# Patient Record
Sex: Male | Born: 1935
Health system: Southern US, Community
[De-identification: ages and names within clinical notes are randomized; demographics above are authoritative.]

## PROBLEM LIST (undated history)

## (undated) DIAGNOSIS — I10 Essential (primary) hypertension: Secondary | ICD-10-CM

## (undated) DIAGNOSIS — E119 Type 2 diabetes mellitus without complications: Secondary | ICD-10-CM

## (undated) DIAGNOSIS — E785 Hyperlipidemia, unspecified: Secondary | ICD-10-CM

## (undated) HISTORY — DX: Hyperlipidemia, unspecified: E78.5

## (undated) HISTORY — DX: Type 2 diabetes mellitus without complications: E11.9

## (undated) HISTORY — PX: EYE SURGERY: SHX253

## (undated) HISTORY — DX: Essential (primary) hypertension: I10

---

## 2009-08-11 ENCOUNTER — Inpatient Hospital Stay (HOSPITAL_COMMUNITY): Admission: RE | Admit: 2009-08-11 | Discharge: 2009-08-14 | Payer: Self-pay | Admitting: Orthopedic Surgery

## 2009-09-25 ENCOUNTER — Encounter: Admission: RE | Admit: 2009-09-25 | Discharge: 2009-12-18 | Payer: Self-pay | Admitting: Orthopedic Surgery

## 2010-01-19 ENCOUNTER — Inpatient Hospital Stay (HOSPITAL_COMMUNITY): Admission: RE | Admit: 2010-01-19 | Discharge: 2010-01-22 | Payer: Self-pay | Admitting: Orthopedic Surgery

## 2010-02-17 ENCOUNTER — Encounter
Admission: RE | Admit: 2010-02-17 | Discharge: 2010-03-19 | Payer: Self-pay | Source: Home / Self Care | Attending: Orthopedic Surgery | Admitting: Orthopedic Surgery

## 2010-03-23 ENCOUNTER — Encounter
Admission: RE | Admit: 2010-03-23 | Discharge: 2010-04-21 | Payer: Self-pay | Source: Home / Self Care | Attending: Orthopedic Surgery | Admitting: Orthopedic Surgery

## 2010-04-23 ENCOUNTER — Ambulatory Visit: Payer: Medicare Other | Attending: Orthopedic Surgery | Admitting: Physical Therapy

## 2010-04-23 DIAGNOSIS — Z96659 Presence of unspecified artificial knee joint: Secondary | ICD-10-CM | POA: Insufficient documentation

## 2010-04-23 DIAGNOSIS — IMO0001 Reserved for inherently not codable concepts without codable children: Secondary | ICD-10-CM | POA: Insufficient documentation

## 2010-04-23 DIAGNOSIS — R262 Difficulty in walking, not elsewhere classified: Secondary | ICD-10-CM | POA: Insufficient documentation

## 2010-04-23 DIAGNOSIS — R5381 Other malaise: Secondary | ICD-10-CM | POA: Insufficient documentation

## 2010-04-23 DIAGNOSIS — M25669 Stiffness of unspecified knee, not elsewhere classified: Secondary | ICD-10-CM | POA: Insufficient documentation

## 2010-04-23 DIAGNOSIS — M25569 Pain in unspecified knee: Secondary | ICD-10-CM | POA: Insufficient documentation

## 2010-04-27 ENCOUNTER — Ambulatory Visit: Payer: Medicare Other | Admitting: Physical Therapy

## 2010-04-30 ENCOUNTER — Ambulatory Visit: Payer: Medicare Other | Admitting: *Deleted

## 2010-05-04 ENCOUNTER — Ambulatory Visit: Payer: Medicare Other | Admitting: Physical Therapy

## 2010-05-07 ENCOUNTER — Ambulatory Visit: Payer: Medicare Other | Admitting: Physical Therapy

## 2010-06-02 LAB — GLUCOSE, CAPILLARY
Glucose-Capillary: 107 mg/dL — ABNORMAL HIGH (ref 70–99)
Glucose-Capillary: 119 mg/dL — ABNORMAL HIGH (ref 70–99)
Glucose-Capillary: 119 mg/dL — ABNORMAL HIGH (ref 70–99)
Glucose-Capillary: 119 mg/dL — ABNORMAL HIGH (ref 70–99)
Glucose-Capillary: 124 mg/dL — ABNORMAL HIGH (ref 70–99)
Glucose-Capillary: 126 mg/dL — ABNORMAL HIGH (ref 70–99)
Glucose-Capillary: 127 mg/dL — ABNORMAL HIGH (ref 70–99)
Glucose-Capillary: 139 mg/dL — ABNORMAL HIGH (ref 70–99)
Glucose-Capillary: 148 mg/dL — ABNORMAL HIGH (ref 70–99)
Glucose-Capillary: 157 mg/dL — ABNORMAL HIGH (ref 70–99)
Glucose-Capillary: 193 mg/dL — ABNORMAL HIGH (ref 70–99)
Glucose-Capillary: 201 mg/dL — ABNORMAL HIGH (ref 70–99)

## 2010-06-02 LAB — CBC
HCT: 22.1 % — ABNORMAL LOW (ref 39.0–52.0)
HCT: 26.1 % — ABNORMAL LOW (ref 39.0–52.0)
HCT: 30.4 % — ABNORMAL LOW (ref 39.0–52.0)
Hemoglobin: 10.5 g/dL — ABNORMAL LOW (ref 13.0–17.0)
Hemoglobin: 7.6 g/dL — ABNORMAL LOW (ref 13.0–17.0)
Hemoglobin: 8.9 g/dL — ABNORMAL LOW (ref 13.0–17.0)
MCH: 30.4 pg (ref 26.0–34.0)
MCH: 30.5 pg (ref 26.0–34.0)
MCH: 30.5 pg (ref 26.0–34.0)
MCHC: 34.3 g/dL (ref 30.0–36.0)
MCHC: 34.4 g/dL (ref 30.0–36.0)
MCHC: 34.6 g/dL (ref 30.0–36.0)
MCV: 88.2 fL (ref 78.0–100.0)
MCV: 88.2 fL (ref 78.0–100.0)
MCV: 88.8 fL (ref 78.0–100.0)
Platelets: 106 10*3/uL — ABNORMAL LOW (ref 150–400)
Platelets: 110 10*3/uL — ABNORMAL LOW (ref 150–400)
Platelets: 117 10*3/uL — ABNORMAL LOW (ref 150–400)
RBC: 2.5 MIL/uL — ABNORMAL LOW (ref 4.22–5.81)
RBC: 2.94 MIL/uL — ABNORMAL LOW (ref 4.22–5.81)
RBC: 3.44 MIL/uL — ABNORMAL LOW (ref 4.22–5.81)
RDW: 14 % (ref 11.5–15.5)
RDW: 14 % (ref 11.5–15.5)
RDW: 14.3 % (ref 11.5–15.5)
WBC: 6.4 10*3/uL (ref 4.0–10.5)
WBC: 6.5 10*3/uL (ref 4.0–10.5)
WBC: 6.6 10*3/uL (ref 4.0–10.5)

## 2010-06-02 LAB — BASIC METABOLIC PANEL
BUN: 10 mg/dL (ref 6–23)
BUN: 11 mg/dL (ref 6–23)
BUN: 7 mg/dL (ref 6–23)
CO2: 26 mEq/L (ref 19–32)
CO2: 28 mEq/L (ref 19–32)
CO2: 28 mEq/L (ref 19–32)
Calcium: 8.6 mg/dL (ref 8.4–10.5)
Calcium: 8.6 mg/dL (ref 8.4–10.5)
Calcium: 8.9 mg/dL (ref 8.4–10.5)
Chloride: 101 mEq/L (ref 96–112)
Chloride: 102 mEq/L (ref 96–112)
Chloride: 103 mEq/L (ref 96–112)
Creatinine, Ser: 0.65 mg/dL (ref 0.4–1.5)
Creatinine, Ser: 0.7 mg/dL (ref 0.4–1.5)
Creatinine, Ser: 0.75 mg/dL (ref 0.4–1.5)
GFR calc Af Amer: >60 mL/min (ref >60)
GFR calc Af Amer: >60 mL/min (ref >60)
GFR calc Af Amer: >60 mL/min (ref >60)
GFR calc non Af Amer: >60 mL/min (ref >60)
GFR calc non Af Amer: >60 mL/min (ref >60)
GFR calc non Af Amer: >60 mL/min (ref >60)
Glucose, Bld: 117 mg/dL — ABNORMAL HIGH (ref 70–99)
Glucose, Bld: 128 mg/dL — ABNORMAL HIGH (ref 70–99)
Glucose, Bld: 132 mg/dL — ABNORMAL HIGH (ref 70–99)
Potassium: 3.8 mEq/L (ref 3.5–5.1)
Potassium: 4 mEq/L (ref 3.5–5.1)
Potassium: 4.1 mEq/L (ref 3.5–5.1)
Sodium: 134 mEq/L — ABNORMAL LOW (ref 135–145)
Sodium: 135 mEq/L (ref 135–145)
Sodium: 136 mEq/L (ref 135–145)

## 2010-06-02 LAB — PROTIME-INR
INR: 1.18 (ref 0.00–1.49)
INR: 1.63 — ABNORMAL HIGH (ref 0.00–1.49)
INR: 1.96 — ABNORMAL HIGH (ref 0.00–1.49)
Prothrombin Time: 15.2 seconds (ref 11.6–15.2)
Prothrombin Time: 19.5 seconds — ABNORMAL HIGH (ref 11.6–15.2)
Prothrombin Time: 22.5 seconds — ABNORMAL HIGH (ref 11.6–15.2)

## 2010-06-02 LAB — PREPARE RBC (CROSSMATCH)

## 2010-06-03 LAB — URINALYSIS, ROUTINE W REFLEX MICROSCOPIC
Bilirubin Urine: NEGATIVE
Glucose, UA: NEGATIVE mg/dL
Hgb urine dipstick: NEGATIVE
Ketones, ur: NEGATIVE mg/dL
Nitrite: NEGATIVE
Protein, ur: NEGATIVE mg/dL
Specific Gravity, Urine: 1.025 (ref 1.005–1.030)
Urobilinogen, UA: 1 mg/dL (ref 0.0–1.0)
pH: 6 (ref 5.0–8.0)

## 2010-06-03 LAB — CBC
HCT: 38 % — ABNORMAL LOW (ref 39.0–52.0)
Hemoglobin: 13.1 g/dL (ref 13.0–17.0)
MCH: 30.3 pg (ref 26.0–34.0)
MCHC: 34.3 g/dL (ref 30.0–36.0)
MCV: 88.3 fL (ref 78.0–100.0)
Platelets: 160 10*3/uL (ref 150–400)
RBC: 4.3 MIL/uL (ref 4.22–5.81)
RDW: 14 % (ref 11.5–15.5)
WBC: 4.7 10*3/uL (ref 4.0–10.5)

## 2010-06-03 LAB — COMPREHENSIVE METABOLIC PANEL
ALT: 18 U/L (ref 0–53)
AST: 22 U/L (ref 0–37)
Albumin: 4.3 g/dL (ref 3.5–5.2)
Alkaline Phosphatase: 69 U/L (ref 39–117)
BUN: 18 mg/dL (ref 6–23)
CO2: 28 mEq/L (ref 19–32)
Calcium: 9.4 mg/dL (ref 8.4–10.5)
Chloride: 105 mEq/L (ref 96–112)
Creatinine, Ser: 0.77 mg/dL (ref 0.4–1.5)
GFR calc Af Amer: >60 mL/min (ref >60)
GFR calc non Af Amer: >60 mL/min (ref >60)
Glucose, Bld: 123 mg/dL — ABNORMAL HIGH (ref 70–99)
Potassium: 3.9 mEq/L (ref 3.5–5.1)
Sodium: 139 mEq/L (ref 135–145)
Total Bilirubin: 0.5 mg/dL (ref 0.3–1.2)
Total Protein: 7.5 g/dL (ref 6.0–8.3)

## 2010-06-03 LAB — TYPE AND SCREEN
ABO/RH(D): B POS
Antibody Screen: NEGATIVE
Unit division: 0
Unit division: 0

## 2010-06-03 LAB — GLUCOSE, CAPILLARY
Glucose-Capillary: 102 mg/dL — ABNORMAL HIGH (ref 70–99)
Glucose-Capillary: 115 mg/dL — ABNORMAL HIGH (ref 70–99)
Glucose-Capillary: 127 mg/dL — ABNORMAL HIGH (ref 70–99)
Glucose-Capillary: 136 mg/dL — ABNORMAL HIGH (ref 70–99)

## 2010-06-03 LAB — SURGICAL PCR SCREEN
MRSA, PCR: NEGATIVE
Staphylococcus aureus: POSITIVE — AB

## 2010-06-03 LAB — APTT: aPTT: 31 seconds (ref 24–37)

## 2010-06-03 LAB — PROTIME-INR
INR: 1 (ref 0.00–1.49)
Prothrombin Time: 13.4 seconds (ref 11.6–15.2)

## 2010-06-08 LAB — TYPE AND SCREEN
ABO/RH(D): B POS
Antibody Screen: NEGATIVE

## 2010-06-08 LAB — CBC
HCT: 25.4 % — ABNORMAL LOW (ref 39.0–52.0)
HCT: 29.4 % — ABNORMAL LOW (ref 39.0–52.0)
HCT: 29.8 % — ABNORMAL LOW (ref 39.0–52.0)
HCT: 36.6 % — ABNORMAL LOW (ref 39.0–52.0)
Hemoglobin: 10 g/dL — ABNORMAL LOW (ref 13.0–17.0)
Hemoglobin: 10 g/dL — ABNORMAL LOW (ref 13.0–17.0)
Hemoglobin: 12.4 g/dL — ABNORMAL LOW (ref 13.0–17.0)
Hemoglobin: 8.5 g/dL — ABNORMAL LOW (ref 13.0–17.0)
MCHC: 33.6 g/dL (ref 30.0–36.0)
MCHC: 33.6 g/dL (ref 30.0–36.0)
MCHC: 33.8 g/dL (ref 30.0–36.0)
MCHC: 33.9 g/dL (ref 30.0–36.0)
MCV: 90.1 fL (ref 78.0–100.0)
MCV: 90.2 fL (ref 78.0–100.0)
MCV: 90.6 fL (ref 78.0–100.0)
MCV: 91.4 fL (ref 78.0–100.0)
Platelets: 106 10*3/uL — ABNORMAL LOW (ref 150–400)
Platelets: 114 10*3/uL — ABNORMAL LOW (ref 150–400)
Platelets: 163 10*3/uL (ref 150–400)
Platelets: 90 10*3/uL — ABNORMAL LOW (ref 150–400)
RBC: 2.77 MIL/uL — ABNORMAL LOW (ref 4.22–5.81)
RBC: 3.26 MIL/uL — ABNORMAL LOW (ref 4.22–5.81)
RBC: 3.31 MIL/uL — ABNORMAL LOW (ref 4.22–5.81)
RBC: 4.04 MIL/uL — ABNORMAL LOW (ref 4.22–5.81)
RDW: 12.7 % (ref 11.5–15.5)
RDW: 13.4 % (ref 11.5–15.5)
RDW: 13.4 % (ref 11.5–15.5)
RDW: 13.6 % (ref 11.5–15.5)
WBC: 5 10*3/uL (ref 4.0–10.5)
WBC: 7 10*3/uL (ref 4.0–10.5)
WBC: 7.3 10*3/uL (ref 4.0–10.5)
WBC: 7.6 10*3/uL (ref 4.0–10.5)

## 2010-06-08 LAB — URINE MICROSCOPIC-ADD ON

## 2010-06-08 LAB — GLUCOSE, CAPILLARY
Glucose-Capillary: 118 mg/dL — ABNORMAL HIGH (ref 70–99)
Glucose-Capillary: 127 mg/dL — ABNORMAL HIGH (ref 70–99)
Glucose-Capillary: 127 mg/dL — ABNORMAL HIGH (ref 70–99)
Glucose-Capillary: 129 mg/dL — ABNORMAL HIGH (ref 70–99)
Glucose-Capillary: 131 mg/dL — ABNORMAL HIGH (ref 70–99)
Glucose-Capillary: 132 mg/dL — ABNORMAL HIGH (ref 70–99)
Glucose-Capillary: 134 mg/dL — ABNORMAL HIGH (ref 70–99)
Glucose-Capillary: 139 mg/dL — ABNORMAL HIGH (ref 70–99)
Glucose-Capillary: 153 mg/dL — ABNORMAL HIGH (ref 70–99)
Glucose-Capillary: 154 mg/dL — ABNORMAL HIGH (ref 70–99)
Glucose-Capillary: 158 mg/dL — ABNORMAL HIGH (ref 70–99)
Glucose-Capillary: 166 mg/dL — ABNORMAL HIGH (ref 70–99)
Glucose-Capillary: 187 mg/dL — ABNORMAL HIGH (ref 70–99)
Glucose-Capillary: 235 mg/dL — ABNORMAL HIGH (ref 70–99)

## 2010-06-08 LAB — COMPREHENSIVE METABOLIC PANEL
ALT: 17 U/L (ref 0–53)
AST: 23 U/L (ref 0–37)
Albumin: 4.4 g/dL (ref 3.5–5.2)
Alkaline Phosphatase: 76 U/L (ref 39–117)
BUN: 16 mg/dL (ref 6–23)
CO2: 26 mEq/L (ref 19–32)
Calcium: 9.4 mg/dL (ref 8.4–10.5)
Chloride: 106 mEq/L (ref 96–112)
Creatinine, Ser: 0.7 mg/dL (ref 0.4–1.5)
GFR calc Af Amer: >60 mL/min (ref >60)
GFR calc non Af Amer: >60 mL/min (ref >60)
Glucose, Bld: 76 mg/dL (ref 70–99)
Potassium: 4 mEq/L (ref 3.5–5.1)
Sodium: 139 mEq/L (ref 135–145)
Total Bilirubin: 0.7 mg/dL (ref 0.3–1.2)
Total Protein: 7.8 g/dL (ref 6.0–8.3)

## 2010-06-08 LAB — URINALYSIS, ROUTINE W REFLEX MICROSCOPIC
Bilirubin Urine: NEGATIVE
Glucose, UA: NEGATIVE mg/dL
Hgb urine dipstick: NEGATIVE
Ketones, ur: NEGATIVE mg/dL
Leukocytes, UA: NEGATIVE
Nitrite: NEGATIVE
Protein, ur: 30 mg/dL — AB
Specific Gravity, Urine: 1.024 (ref 1.005–1.030)
Urobilinogen, UA: 0.2 mg/dL (ref 0.0–1.0)
pH: 6 (ref 5.0–8.0)

## 2010-06-08 LAB — PROTIME-INR
INR: 1.06 (ref 0.00–1.49)
INR: 1.18 (ref 0.00–1.49)
INR: 1.93 — ABNORMAL HIGH (ref 0.00–1.49)
INR: 2.14 — ABNORMAL HIGH (ref 0.00–1.49)
Prothrombin Time: 13.7 seconds (ref 11.6–15.2)
Prothrombin Time: 14.9 seconds (ref 11.6–15.2)
Prothrombin Time: 21.9 seconds — ABNORMAL HIGH (ref 11.6–15.2)
Prothrombin Time: 23.7 seconds — ABNORMAL HIGH (ref 11.6–15.2)

## 2010-06-08 LAB — BASIC METABOLIC PANEL
BUN: 13 mg/dL (ref 6–23)
BUN: 9 mg/dL (ref 6–23)
CO2: 27 mEq/L (ref 19–32)
CO2: 27 mEq/L (ref 19–32)
Calcium: 8.7 mg/dL (ref 8.4–10.5)
Calcium: 8.8 mg/dL (ref 8.4–10.5)
Chloride: 102 mEq/L (ref 96–112)
Chloride: 105 mEq/L (ref 96–112)
Creatinine, Ser: 0.7 mg/dL (ref 0.4–1.5)
Creatinine, Ser: 0.74 mg/dL (ref 0.4–1.5)
GFR calc Af Amer: >60 mL/min (ref >60)
GFR calc Af Amer: >60 mL/min (ref >60)
GFR calc non Af Amer: >60 mL/min (ref >60)
GFR calc non Af Amer: >60 mL/min (ref >60)
Glucose, Bld: 157 mg/dL — ABNORMAL HIGH (ref 70–99)
Glucose, Bld: 202 mg/dL — ABNORMAL HIGH (ref 70–99)
Potassium: 3.8 mEq/L (ref 3.5–5.1)
Potassium: 4.7 mEq/L (ref 3.5–5.1)
Sodium: 136 mEq/L (ref 135–145)
Sodium: 139 mEq/L (ref 135–145)

## 2010-06-08 LAB — ABO/RH: ABO/RH(D): B POS

## 2010-06-08 LAB — APTT: aPTT: 29 seconds (ref 24–37)

## 2011-06-07 DIAGNOSIS — I10 Essential (primary) hypertension: Secondary | ICD-10-CM | POA: Diagnosis not present

## 2011-06-07 DIAGNOSIS — E785 Hyperlipidemia, unspecified: Secondary | ICD-10-CM | POA: Diagnosis not present

## 2011-06-07 DIAGNOSIS — E559 Vitamin D deficiency, unspecified: Secondary | ICD-10-CM | POA: Diagnosis not present

## 2011-06-07 DIAGNOSIS — E119 Type 2 diabetes mellitus without complications: Secondary | ICD-10-CM | POA: Diagnosis not present

## 2011-06-07 DIAGNOSIS — Z125 Encounter for screening for malignant neoplasm of prostate: Secondary | ICD-10-CM | POA: Diagnosis not present

## 2011-07-02 DIAGNOSIS — Z961 Presence of intraocular lens: Secondary | ICD-10-CM | POA: Diagnosis not present

## 2011-07-02 DIAGNOSIS — H52229 Regular astigmatism, unspecified eye: Secondary | ICD-10-CM | POA: Diagnosis not present

## 2011-07-02 DIAGNOSIS — H5200 Hypermetropia, unspecified eye: Secondary | ICD-10-CM | POA: Diagnosis not present

## 2011-07-02 DIAGNOSIS — E119 Type 2 diabetes mellitus without complications: Secondary | ICD-10-CM | POA: Diagnosis not present

## 2012-03-23 DIAGNOSIS — L2089 Other atopic dermatitis: Secondary | ICD-10-CM | POA: Diagnosis not present

## 2012-03-23 DIAGNOSIS — Z23 Encounter for immunization: Secondary | ICD-10-CM | POA: Diagnosis not present

## 2012-08-01 ENCOUNTER — Other Ambulatory Visit: Payer: Self-pay | Admitting: *Deleted

## 2012-08-01 MED ORDER — SITAGLIPTIN PHOS-METFORMIN HCL 50-1000 MG PO TABS: 1.0000 | ORAL_TABLET | Freq: Two times a day (BID) | ORAL | Status: DC

## 2012-08-01 NOTE — Telephone Encounter (Signed)
Patient last seen on 03-23-12. Last labs done 06-07-11. Please advise.

## 2012-08-31 ENCOUNTER — Other Ambulatory Visit: Payer: Self-pay | Admitting: *Deleted

## 2012-08-31 MED ORDER — LISINOPRIL 5 MG PO TABS: 10.0000 mg | ORAL_TABLET | Freq: Every day | ORAL | Status: DC

## 2012-10-11 ENCOUNTER — Other Ambulatory Visit: Payer: Self-pay

## 2012-10-11 MED ORDER — SITAGLIPTIN PHOS-METFORMIN HCL 50-1000 MG PO TABS: 1.0000 | ORAL_TABLET | Freq: Two times a day (BID) | ORAL | Status: DC

## 2012-10-11 NOTE — Telephone Encounter (Signed)
Last seen MMM 03/23/12  Last filled 08/01/12  Last glucose level 06/07/11

## 2012-11-06 ENCOUNTER — Other Ambulatory Visit: Payer: Self-pay

## 2012-11-06 MED ORDER — LISINOPRIL 5 MG PO TABS: 10.0000 mg | ORAL_TABLET | Freq: Every day | ORAL | Status: DC

## 2012-11-06 MED ORDER — GLIPIZIDE 10 MG PO TABS: 10.0000 mg | ORAL_TABLET | Freq: Two times a day (BID) | ORAL | Status: DC

## 2012-11-06 NOTE — Telephone Encounter (Signed)
Last seen 03/23/12  MMM  Glipizide being sent over from the drug store but not on med list in chart

## 2012-11-07 ENCOUNTER — Telehealth: Payer: Self-pay | Admitting: Nurse Practitioner

## 2012-11-15 ENCOUNTER — Other Ambulatory Visit: Payer: Self-pay

## 2012-11-15 MED ORDER — LISINOPRIL 5 MG PO TABS: 10.0000 mg | ORAL_TABLET | Freq: Every day | ORAL | Status: DC

## 2012-11-15 MED ORDER — GLIPIZIDE 10 MG PO TABS: 10.0000 mg | ORAL_TABLET | Freq: Two times a day (BID) | ORAL | Status: DC

## 2012-11-15 MED ORDER — SITAGLIPTIN PHOS-METFORMIN HCL 50-1000 MG PO TABS: 1.0000 | ORAL_TABLET | Freq: Two times a day (BID) | ORAL | Status: DC

## 2012-11-15 NOTE — Telephone Encounter (Signed)
Patient has an appt with you on 12/15/12  Is out of Blood sugar meds

## 2012-12-15 ENCOUNTER — Encounter: Payer: Self-pay | Admitting: Nurse Practitioner

## 2012-12-15 ENCOUNTER — Ambulatory Visit (INDEPENDENT_AMBULATORY_CARE_PROVIDER_SITE_OTHER): Payer: Medicare Other | Admitting: Nurse Practitioner

## 2012-12-15 VITALS — BP 124/67 | HR 62 | Temp 97.2°F | Ht 60.0 in | Wt 158.0 lb

## 2012-12-15 DIAGNOSIS — I10 Essential (primary) hypertension: Secondary | ICD-10-CM

## 2012-12-15 DIAGNOSIS — Z125 Encounter for screening for malignant neoplasm of prostate: Secondary | ICD-10-CM

## 2012-12-15 DIAGNOSIS — Z23 Encounter for immunization: Secondary | ICD-10-CM | POA: Diagnosis not present

## 2012-12-15 DIAGNOSIS — E119 Type 2 diabetes mellitus without complications: Secondary | ICD-10-CM

## 2012-12-15 DIAGNOSIS — E1159 Type 2 diabetes mellitus with other circulatory complications: Secondary | ICD-10-CM | POA: Insufficient documentation

## 2012-12-15 LAB — POCT GLYCOSYLATED HEMOGLOBIN (HGB A1C): Hemoglobin A1C: 7.1

## 2012-12-15 MED ORDER — SITAGLIPTIN PHOS-METFORMIN HCL 50-1000 MG PO TABS: 1.0000 | ORAL_TABLET | Freq: Two times a day (BID) | ORAL | Status: DC

## 2012-12-15 MED ORDER — GLIPIZIDE 10 MG PO TABS: 10.0000 mg | ORAL_TABLET | Freq: Two times a day (BID) | ORAL | Status: DC

## 2012-12-15 MED ORDER — LISINOPRIL 5 MG PO TABS: 10.0000 mg | ORAL_TABLET | Freq: Every day | ORAL | Status: DC

## 2012-12-15 NOTE — Progress Notes (Signed)
Subjective:    Patient ID: Roberto Miller, male    DOB: December 06, 1931, 79 y.o.   MRN: [id number]  Hypertension This is a chronic problem. The current episode started more than 1 year ago. The problem is unchanged. The problem is controlled. Pertinent negatives include no blurred vision, chest pain, headaches, neck pain, orthopnea, palpitations, peripheral edema, shortness of breath or sweats. There are no associated agents to hypertension. Risk factors for coronary artery disease include family history, obesity and male gender. Past treatments include calcium channel blockers and ACE inhibitors. The current treatment provides significant improvement. There are no compliance problems.   Diabetes He presents for his follow-up diabetic visit. He has type 2 diabetes mellitus. No MedicAlert identification noted. His disease course has been stable. Pertinent negatives for hypoglycemia include no headaches or sweats. Pertinent negatives for diabetes include no blurred vision and no chest pain. There are no hypoglycemic complications. Symptoms are stable. There are no diabetic complications. He is compliant with treatment most of the time. His weight is stable. When asked about meal planning, he reported none. He has not had a previous visit with a dietician. He rarely participates in exercise. There is no change in his home blood glucose trend. His breakfast blood glucose is taken between 8-9 am. His breakfast blood glucose range is generally 110-130 mg/dl. An ACE inhibitor/angiotensin II receptor blocker is being taken. He does not see a podiatrist.Eye exam is not current.      Review of Systems  HENT: Negative for neck pain.   Eyes: Negative for blurred vision.  Respiratory: Negative for shortness of breath.   Cardiovascular: Negative for chest pain, palpitations and orthopnea.  Neurological: Negative for headaches.  All other systems reviewed and are negative.       Objective:   Physical Exam   Constitutional: He is oriented to person, place, and time. He appears well-developed and well-nourished.  HENT:  Head: Normocephalic.  Right Ear: External ear normal.  Left Ear: External ear normal.  Nose: Nose normal.  Mouth/Throat: Oropharynx is clear and moist.  Eyes: EOM are normal. Pupils are equal, round, and reactive to light.  Neck: Normal range of motion. Neck supple. No thyromegaly present.  Cardiovascular: Normal rate, regular rhythm, normal heart sounds and intact distal pulses.   No murmur heard. Pulmonary/Chest: Effort normal and breath sounds normal. He has no wheezes. He has no rales.  Abdominal: Soft. Bowel sounds are normal.  Genitourinary:  Refuses rectal exam   Musculoskeletal: Normal range of motion.  Neurological: He is alert and oriented to person, place, and time.  Skin: Skin is warm and dry.  Psychiatric: He has a normal mood and affect. His behavior is normal. Judgment and thought content normal.     BP 124/67  Pulse 62  Temp(Src) 97.2 F (36.2 C) (Oral)  Ht 5' (1.524 m)  Wt 158 lb (71.668 kg)  BMI 30.86 kg/m2      Assessment & Plan:   1. Hypertension   2. Type II or unspecified type diabetes mellitus without mention of complication, not stated as uncontrolled   3. Prostate cancer screening    Orders Placed This Encounter  Procedures  . CMP14+EGFR  . NMR, lipoprofile  . POCT glycosylated hemoglobin (Hb A1C)   Meds ordered this encounter  Medications  . lisinopril (PRINIVIL,ZESTRIL) 5 MG tablet    Sig: Take 2 tablets (10 mg total) by mouth daily.    Dispense:  60 tablet    Refill:  5  Order Specific Question:  Supervising Provider    Answer:  Ernestina Penna [[id number]]  . glipiZIDE (GLUCOTROL) 10 MG tablet    Sig: Take 1 tablet (10 mg total) by mouth 2 (two) times daily before a meal.    Dispense:  30 tablet    Refill:  5    Order Specific Question:  Supervising Provider    Answer:  Ernestina Penna [[id number]]  . sitaGLIPtin-metformin  (JANUMET) 50-1000 MG per tablet    Sig: Take 1 tablet by mouth 2 (two) times daily with a meal.    Dispense:  60 tablet    Refill:  5    Order Specific Question:  Supervising Provider    Answer:  Deborra Medina    Continue all meds Labs pending Diet and exercise encouraged Health maintenance reviewed Follow up in 3 months   Mary-Margaret Daphine Deutscher, FNP

## 2012-12-15 NOTE — Patient Instructions (Addendum)
Health Maintenance, Males A healthy lifestyle and preventative care can promote health and wellness.  Maintain regular health, dental, and eye exams.  Eat a healthy diet. Foods like vegetables, fruits, whole grains, low-fat dairy products, and lean protein foods contain the nutrients you need without too many calories. Decrease your intake of foods high in solid fats, added sugars, and salt. Get information about a proper diet from your caregiver, if necessary.  Regular physical exercise is one of the most important things you can do for your health. Most adults should get at least 150 minutes of moderate-intensity exercise (any activity that increases your heart rate and causes you to sweat) each week. In addition, most adults need muscle-strengthening exercises on 2 or more days a week.   Maintain a healthy weight. The body mass index (BMI) is a screening tool to identify possible weight problems. It provides an estimate of body fat based on height and weight. Your caregiver can help determine your BMI, and can help you achieve or maintain a healthy weight. For adults 20 years and older:  A BMI below 18.5 is considered underweight.  A BMI of 18.5 to 24.9 is normal.  A BMI of 25 to 29.9 is considered overweight.  A BMI of 30 and above is considered obese.  Maintain normal blood lipids and cholesterol by exercising and minimizing your intake of saturated fat. Eat a balanced diet with plenty of fruits and vegetables. Blood tests for lipids and cholesterol should begin at age 20 and be repeated every 5 years. If your lipid or cholesterol levels are high, you are over 50, or you are a high risk for heart disease, you may need your cholesterol levels checked more frequently.Ongoing high lipid and cholesterol levels should be treated with medicines, if diet and exercise are not effective.  If you smoke, find out from your caregiver how to quit. If you do not use tobacco, do not start.  If you  choose to drink alcohol, do not exceed 2 drinks per day. One drink is considered to be 12 ounces (355 mL) of beer, 5 ounces (148 mL) of wine, or 1.5 ounces (44 mL) of liquor.  Avoid use of street drugs. Do not share needles with anyone. Ask for help if you need support or instructions about stopping the use of drugs.  High blood pressure causes heart disease and increases the risk of stroke. Blood pressure should be checked at least every 1 to 2 years. Ongoing high blood pressure should be treated with medicines if weight loss and exercise are not effective.  If you are 45 to 77 years old, ask your caregiver if you should take aspirin to prevent heart disease.  Diabetes screening involves taking a blood sample to check your fasting blood sugar level. This should be done once every 3 years, after age 45, if you are within normal weight and without risk factors for diabetes. Testing should be considered at a younger age or be carried out more frequently if you are overweight and have at least 1 risk factor for diabetes.  Colorectal cancer can be detected and often prevented. Most routine colorectal cancer screening begins at the age of 50 and continues through age 75. However, your caregiver may recommend screening at an earlier age if you have risk factors for colon cancer. On a yearly basis, your caregiver may provide home test kits to check for hidden blood in the stool. Use of a small camera at the end of a tube,   to directly examine the colon (sigmoidoscopy or colonoscopy), can detect the earliest forms of colorectal cancer. Talk to your caregiver about this at age 83, when routine screening begins. Direct examination of the colon should be repeated every 5 to 10 years through age 48, unless early forms of pre-cancerous polyps or small growths are found.  Hepatitis C blood testing is recommended for all people born from [birth year] through 1963 and any individual with known risks for hepatitis C.  Healthy  men should no longer receive prostate-specific antigen (PSA) blood tests as part of routine cancer screening. Consult with your caregiver about prostate cancer screening.  Testicular cancer screening is not recommended for adolescents or adult males who have no symptoms. Screening includes self-exam, caregiver exam, and other screening tests. Consult with your caregiver about any symptoms you have or any concerns you have about testicular cancer.  Practice safe sex. Use condoms and avoid high-risk sexual practices to reduce the spread of sexually transmitted infections (STIs).  Use sunscreen with a sun protection factor (SPF) of 30 or greater. Apply sunscreen liberally and repeatedly throughout the day. You should seek shade when your shadow is shorter than you. Protect yourself by wearing long sleeves, pants, a wide-brimmed hat, and sunglasses year round, whenever you are outdoors.  Notify your caregiver of new moles or changes in moles, especially if there is a change in shape or color. Also notify your caregiver if a mole is larger than the size of a pencil eraser.  A one-time screening for abdominal aortic aneurysm (AAA) and surgical repair of large AAAs by sound wave imaging (ultrasonography) is recommended for ages 66 to 23 years who are current or former smokers.  Stay current with your immunizations. Document Released: 09/04/2007 Document Revised: 05/31/2011 Document Reviewed: 08/03/2010 [facility] Patient Information 2014 [place], [place].   Influenza Virus Trivalent Vaccine injection Qu es este medicamento? La VACUNA ANTIGRIPAL ayuda a disminuir el riesgo de contraer la influenza, tambin conocida como la gripe. La vacuna solo ayuda a protegerle contra algunas cepas de influenza. Este medicamento puede ser utilizado para otros usos; si tiene alguna pregunta consulte con su proveedor de atencin mdica o con su farmacutico. Qu le debo informar a mi profesional de la salud antes de  tomar este medicamento? Necesita saber si usted presenta alguno de los siguientes problemas o situaciones: -trastorno de sangrado como hemofilia -fiebre o infeccin -sndrome de Guillain-Barre u otros problemas neurolgicos -problemas del sistema inmunolgico -infeccin por el virus de la inmunodeficiencia humana (VIH) o SIDA -niveles bajos de plaquetas en la sangre -esclerosis mltiple -una Automotive engineer o inusual a las vacunas antigripales, a los [place], protenas de pollo, al timerosal, a otros medicamentos, alimentos, colorantes o conservantes -si est embarazada o buscando quedar embarazada -si est amamantando a un beb Cmo debo utilizar este medicamento? Esta vacuna se administra mediante inyeccin por va intramuscular o va subcutnea. Lo administra un profesional de [employer]. Recibir una copia de informacin escrita sobre la vacuna antes de cada vacuna. Asegrese de leer este folleto cada vez cuidadosamente. Este folleto puede cambiar con frecuencia. Hable con su pediatra para informarse acerca del uso de este medicamento en nios. Puede requerir atencin especial. Aunque algunas marcas de este medicamento se puede recetar a nios tan menores como de 6 meses de edad para condiciones selectivas, las precauciones se aplican. Sobredosis: Pngase en contacto inmediatamente con un centro toxicolgico o una sala de urgencia si usted cree que haya tomado demasiado medicamento. ATENCIN: Ara Kussmaul es solo para  usted. No comparta este medicamento con nadie. Qu sucede si me olvido de una dosis? No se aplica en este caso. Qu puede interactuar con este medicamento? -quimioterapia o radioterapia -medicamentos que suprimen el sistema inmunolgico, tales como etanercept, anakinra, infliximab y adalimumab -medicamentos que tratan o previenen cogulos sanguneos, como warfarina -fenitona -medicamentos esteroideos, como la prednisona o la cortisona -teofilina -vacunas Puede ser  que esta lista no menciona todas las posibles interacciones. Informe a su profesional de [employer] de [employer] productos a base de hierbas, medicamentos de [place] o suplementos nutritivos que est tomando. Si usted fuma, consume bebidas alcohlicas o si utiliza drogas ilegales, indqueselo tambin a su profesional de [employer]. Algunas sustancias pueden interactuar con su medicamento. A qu debo estar atento al usar [employer]? Informe a su mdico o a Producer, television/film/video de la [employer] todos los efectos secundarios que persistan despus de [address]. Llame a su proveedor de atencin mdica si se presentan sntomas inusuales dentro de las 6 semanas posteriores a la vacunacin. Es posible que todava pueda contraer la gripe, pero la enfermedad no ser tan fuerte como normalmente. No puede contraer la gripe de esta vacuna. La vacuna antigripal no le protege contra resfros u otras enfermedades que pueden causar [place]. Debe vacunarse cada ao. Qu efectos secundarios puedo tener al [employer] este medicamento? Efectos secundarios que debe informar a su mdico o a Producer, television/film/video de la salud tan pronto como sea posible: -reacciones alrgicas como erupcin cutnea, picazn o urticarias, hinchazn de la cara, labios o lengua Efectos secundarios que, por lo general, no requieren atencin mdica (debe informarlos a su mdico o a su profesional de la salud si persisten o si son molestos): -fiebre -dolor de cabeza -molestias y dolores musculares -dolor, sensibilidad, enrojecimiento o Paramedic de la inyeccin -cansancio Puede ser que esta lista no menciona todos los posibles efectos secundarios. Comunquese a su mdico por asesoramiento mdico [employer]. Usted puede informar los efectos secundarios a la FDA por telfono al 1-800-FDA-1088. Dnde debo guardar mi medicina? Esta Automotive engineer un profesional de la salud en una [place], [place], consultorio mdico u  otro consultorio de un profesional de la salud. No se le suministrar esta vacuna para guardar en su domicilio. ATENCIN: Este folleto es un resumen. Puede ser que no cubra toda la posible informacin. Si usted tiene preguntas acerca de esta medicina, consulte con su mdico, su farmacutico o su profesional de Radiographer, therapeutic.  2013, Elsevier/Gold Standard. (09/09/2009 3:29:49 PM)

## 2012-12-16 LAB — CMP14+EGFR
ALT: 16 IU/L (ref 0–44)
AST: 20 IU/L (ref 0–40)
Albumin/Globulin Ratio: 1.7 (ref 1.1–2.5)
Albumin: 4.3 g/dL (ref 3.5–4.8)
Alkaline Phosphatase: 80 IU/L (ref 39–117)
BUN/Creatinine Ratio: 24 — ABNORMAL HIGH (ref 10–22)
BUN: 18 mg/dL (ref 8–27)
CO2: 26 mmol/L (ref 18–29)
Calcium: 8.9 mg/dL (ref 8.6–10.2)
Chloride: 101 mmol/L (ref 97–108)
Creatinine, Ser: 0.75 mg/dL — ABNORMAL LOW (ref 0.76–1.27)
GFR calc Af Amer: 101 mL/min/{1.73_m2} (ref >59)
GFR calc non Af Amer: 87 mL/min/{1.73_m2} (ref >59)
Globulin, Total: 2.5 g/dL (ref 1.5–4.5)
Glucose: 236 mg/dL — ABNORMAL HIGH (ref 65–99)
Potassium: 4 mmol/L (ref 3.5–5.2)
Sodium: 139 mmol/L (ref 134–144)
Total Bilirubin: 0.3 mg/dL (ref 0.0–1.2)
Total Protein: 6.8 g/dL (ref 6.0–8.5)

## 2012-12-16 LAB — NMR, LIPOPROFILE
Cholesterol: 155 mg/dL (ref <200)
HDL Cholesterol by NMR: 44 mg/dL (ref >=40)
HDL Particle Number: 30.9 umol/L (ref >=30.5)
LDL Particle Number: 1373 nmol/L — ABNORMAL HIGH (ref <1000)
LDL Size: 19.9 nm — ABNORMAL LOW (ref >20.5)
LDLC SERPL CALC-MCNC: 68 mg/dL (ref <100)
LP-IR Score: 77 — ABNORMAL HIGH (ref <=45)
Small LDL Particle Number: 995 nmol/L — ABNORMAL HIGH (ref <=527)
Triglycerides by NMR: 217 mg/dL — ABNORMAL HIGH (ref <150)

## 2013-04-18 DIAGNOSIS — H52229 Regular astigmatism, unspecified eye: Secondary | ICD-10-CM | POA: Diagnosis not present

## 2013-04-18 DIAGNOSIS — Z961 Presence of intraocular lens: Secondary | ICD-10-CM | POA: Diagnosis not present

## 2013-04-18 DIAGNOSIS — H524 Presbyopia: Secondary | ICD-10-CM | POA: Diagnosis not present

## 2013-04-18 DIAGNOSIS — H02839 Dermatochalasis of unspecified eye, unspecified eyelid: Secondary | ICD-10-CM | POA: Diagnosis not present

## 2013-06-20 ENCOUNTER — Other Ambulatory Visit: Payer: Self-pay | Admitting: Nurse Practitioner

## 2013-07-19 ENCOUNTER — Other Ambulatory Visit: Payer: Self-pay | Admitting: Family Medicine

## 2013-07-20 NOTE — Telephone Encounter (Signed)
Last A1C 12/15/12

## 2013-07-21 NOTE — Telephone Encounter (Signed)
Patient NTBS for follow up and lab work  

## 2013-07-23 ENCOUNTER — Other Ambulatory Visit: Payer: Self-pay | Admitting: Nurse Practitioner

## 2013-07-24 NOTE — Telephone Encounter (Signed)
Notified at last refill that NTBS. Please advise 

## 2013-07-25 NOTE — Telephone Encounter (Signed)
Patient NTBS for follow up and lab work  

## 2013-08-23 ENCOUNTER — Other Ambulatory Visit: Payer: Self-pay | Admitting: Nurse Practitioner

## 2013-08-24 NOTE — Telephone Encounter (Signed)
Patient last seen in office on 12-15-12. Please advise on refill

## 2013-08-24 NOTE — Telephone Encounter (Signed)
Patient NTBS for follow up and lab work  

## 2013-08-29 DIAGNOSIS — H26499 Other secondary cataract, unspecified eye: Secondary | ICD-10-CM | POA: Diagnosis not present

## 2013-08-29 DIAGNOSIS — H04129 Dry eye syndrome of unspecified lacrimal gland: Secondary | ICD-10-CM | POA: Diagnosis not present

## 2013-08-29 DIAGNOSIS — Z961 Presence of intraocular lens: Secondary | ICD-10-CM | POA: Diagnosis not present

## 2013-10-01 ENCOUNTER — Other Ambulatory Visit: Payer: Self-pay | Admitting: Nurse Practitioner

## 2013-10-01 NOTE — Telephone Encounter (Signed)
Patient NTBS for follow up and lab work  

## 2013-10-01 NOTE — Telephone Encounter (Signed)
Last seen in office on 11-20-12. Please advise

## 2013-10-08 DIAGNOSIS — H26499 Other secondary cataract, unspecified eye: Secondary | ICD-10-CM | POA: Diagnosis not present

## 2013-10-08 DIAGNOSIS — E119 Type 2 diabetes mellitus without complications: Secondary | ICD-10-CM | POA: Diagnosis not present

## 2013-11-05 ENCOUNTER — Other Ambulatory Visit: Payer: Self-pay | Admitting: Nurse Practitioner

## 2013-11-27 ENCOUNTER — Other Ambulatory Visit: Payer: Self-pay | Admitting: Nurse Practitioner

## 2013-12-12 DIAGNOSIS — H26499 Other secondary cataract, unspecified eye: Secondary | ICD-10-CM | POA: Diagnosis not present

## 2013-12-13 LAB — HM DIABETES EYE EXAM

## 2013-12-22 ENCOUNTER — Other Ambulatory Visit: Payer: Self-pay | Admitting: Nurse Practitioner

## 2013-12-27 ENCOUNTER — Ambulatory Visit (INDEPENDENT_AMBULATORY_CARE_PROVIDER_SITE_OTHER): Payer: Medicare Other | Admitting: Nurse Practitioner

## 2013-12-27 ENCOUNTER — Encounter: Payer: Self-pay | Admitting: Nurse Practitioner

## 2013-12-27 VITALS — BP 120/66 | HR 59 | Temp 96.6°F | Ht 60.0 in | Wt 157.2 lb

## 2013-12-27 DIAGNOSIS — Z125 Encounter for screening for malignant neoplasm of prostate: Secondary | ICD-10-CM

## 2013-12-27 DIAGNOSIS — Z23 Encounter for immunization: Secondary | ICD-10-CM | POA: Diagnosis not present

## 2013-12-27 DIAGNOSIS — I10 Essential (primary) hypertension: Secondary | ICD-10-CM | POA: Diagnosis not present

## 2013-12-27 DIAGNOSIS — E1110 Type 2 diabetes mellitus with ketoacidosis without coma: Secondary | ICD-10-CM

## 2013-12-27 DIAGNOSIS — E1310 Other specified diabetes mellitus with ketoacidosis without coma: Secondary | ICD-10-CM

## 2013-12-27 LAB — POCT GLYCOSYLATED HEMOGLOBIN (HGB A1C): Hemoglobin A1C: 7.1

## 2013-12-27 MED ORDER — SITAGLIPTIN PHOS-METFORMIN HCL 50-1000 MG PO TABS: ORAL_TABLET | ORAL | Status: DC

## 2013-12-27 MED ORDER — GLIPIZIDE 10 MG PO TABS: ORAL_TABLET | ORAL | Status: DC

## 2013-12-27 MED ORDER — LISINOPRIL 5 MG PO TABS: 10.0000 mg | ORAL_TABLET | Freq: Every day | ORAL | Status: DC

## 2013-12-27 NOTE — Patient Instructions (Signed)

## 2013-12-27 NOTE — Progress Notes (Signed)
Subjective:    Patient ID: Roberto Miller, male    DOB: March 05, 1932, 80 y.o.   MRN: [id number]  Patient here today for follow up of chronic medical problems. No complaints today   Hypertension This is a chronic problem. The current episode started more than 1 year ago. The problem is unchanged. The problem is controlled. Pertinent negatives include no blurred vision, chest pain, headaches, neck pain, orthopnea, palpitations, peripheral edema, shortness of breath or sweats. There are no associated agents to hypertension. Risk factors for coronary artery disease include family history, obesity and male gender. Past treatments include calcium channel blockers and ACE inhibitors. The current treatment provides significant improvement. There are no compliance problems.   Diabetes He presents for his follow-up diabetic visit. He has type 2 diabetes mellitus. No MedicAlert identification noted. His disease course has been stable. Pertinent negatives for hypoglycemia include no headaches or sweats. Pertinent negatives for diabetes include no blurred vision and no chest pain. There are no hypoglycemic complications. Symptoms are stable. There are no diabetic complications. He is compliant with treatment most of the time. His weight is stable. When asked about meal planning, he reported none. He has not had a previous visit with a dietician. He rarely participates in exercise. There is no change in his home blood glucose trend. His breakfast blood glucose is taken between 8-9 am. His breakfast blood glucose range is generally 110-130 mg/dl. An ACE inhibitor/angiotensin II receptor blocker is being taken. He does not see a podiatrist.Eye exam is not current.      Review of Systems  Eyes: Negative for blurred vision.  Respiratory: Negative for shortness of breath.   Cardiovascular: Negative for chest pain, palpitations and orthopnea.  Musculoskeletal: Negative for neck pain.  Neurological: Negative for  headaches.  All other systems reviewed and are negative.      Objective:   Physical Exam  Constitutional: He is oriented to person, place, and time. He appears well-developed and well-nourished.  HENT:  Head: Normocephalic.  Right Ear: External ear normal.  Left Ear: External ear normal.  Nose: Nose normal.  Mouth/Throat: Oropharynx is clear and moist.  Eyes: EOM are normal. Pupils are equal, round, and reactive to light.  Neck: Normal range of motion. Neck supple. No thyromegaly present.  Cardiovascular: Normal rate, regular rhythm, normal heart sounds and intact distal pulses.   No murmur heard. Pulmonary/Chest: Effort normal and breath sounds normal. He has no wheezes. He has no rales.  Abdominal: Soft. Bowel sounds are normal.  Genitourinary:  Refuses rectal exam   Musculoskeletal: Normal range of motion.  Neurological: He is alert and oriented to person, place, and time.  Skin: Skin is warm and dry.  Psychiatric: He has a normal mood and affect. His behavior is normal. Judgment and thought content normal.     BP 120/66  Pulse 59  Temp(Src) 96.6 F (35.9 C) (Oral)  Ht 5' (1.524 m)  Wt 157 lb 3.2 oz (71.305 kg)  BMI 30.70 kg/m2      Assessment & Plan:   1. DM (diabetes mellitus) type 2, uncontrolled, with ketoacidosis Low carb diet - POCT glycosylated hemoglobin (Hb A1C) - sitaGLIPtin-metformin (JANUMET) 50-1000 MG per tablet; TAKE ONE TABLET BY MOUTH TWICE DAILY  Dispense: 180 tablet; Refill: 1 - glipiZIDE (GLUCOTROL) 10 MG tablet; TAKE ONE TABLET TWICE A DAY WITH FOOD  Dispense: 90 tablet; Refill: 1  2. Essential hypertension Low NA+ diet - CMP14+EGFR - NMR, lipoprofile - lisinopril (PRINIVIL,ZESTRIL) 5 MG tablet; Take  2 tablets (10 mg total) by mouth daily.  Dispense: 90 tablet; Refill: 1  3. Prostate cancer screening - PSA, total and free   Labs pending Health maintenance reviewed Diet and exercise encouraged Continue all meds Follow up  In 3  months   [place], FNP

## 2013-12-28 LAB — NMR, LIPOPROFILE
Cholesterol: 174 mg/dL (ref 100–199)
HDL Cholesterol by NMR: 47 mg/dL (ref >39)
HDL Particle Number: 32.4 umol/L (ref >=30.5)
LDL Particle Number: 1143 nmol/L — ABNORMAL HIGH (ref <1000)
LDL Size: 20.2 nm (ref >20.5)
LDLC SERPL CALC-MCNC: 83 mg/dL (ref 0–99)
LP-IR Score: 69 — ABNORMAL HIGH (ref <=45)
Small LDL Particle Number: 590 nmol/L — ABNORMAL HIGH (ref <=527)
Triglycerides by NMR: 218 mg/dL — ABNORMAL HIGH (ref 0–149)

## 2013-12-28 LAB — PSA, TOTAL AND FREE
PSA, Free Pct: 24.1 %
PSA, Free: 0.41 ng/mL
PSA: 1.7 ng/mL (ref 0.0–4.0)

## 2013-12-28 LAB — CMP14+EGFR
ALT: 15 IU/L (ref 0–44)
AST: 18 IU/L (ref 0–40)
Albumin/Globulin Ratio: 1.7 (ref 1.1–2.5)
Albumin: 4.5 g/dL (ref 3.5–4.7)
Alkaline Phosphatase: 78 IU/L (ref 39–117)
BUN/Creatinine Ratio: 22 (ref 10–22)
BUN: 17 mg/dL (ref 8–27)
CO2: 22 mmol/L (ref 18–29)
Calcium: 9.3 mg/dL (ref 8.6–10.2)
Chloride: 100 mmol/L (ref 97–108)
Creatinine, Ser: 0.79 mg/dL (ref 0.76–1.27)
GFR calc Af Amer: 98 mL/min/{1.73_m2} (ref >59)
GFR calc non Af Amer: 85 mL/min/{1.73_m2} (ref >59)
Globulin, Total: 2.7 g/dL (ref 1.5–4.5)
Glucose: 194 mg/dL — ABNORMAL HIGH (ref 65–99)
Potassium: 4.1 mmol/L (ref 3.5–5.2)
Sodium: 139 mmol/L (ref 134–144)
Total Bilirubin: 0.3 mg/dL (ref 0.0–1.2)
Total Protein: 7.2 g/dL (ref 6.0–8.5)

## 2013-12-29 ENCOUNTER — Telehealth: Payer: Self-pay | Admitting: Nurse Practitioner

## 2013-12-29 ENCOUNTER — Other Ambulatory Visit: Payer: Self-pay | Admitting: Nurse Practitioner

## 2013-12-29 MED ORDER — CLOTRIMAZOLE-BETAMETHASONE 1-0.05 % EX CREA: 1.0000 "application " | TOPICAL_CREAM | Freq: Two times a day (BID) | CUTANEOUS | Status: DC

## 2013-12-29 NOTE — Telephone Encounter (Signed)
Please advise 

## 2014-01-02 ENCOUNTER — Telehealth: Payer: Self-pay | Admitting: Family Medicine

## 2014-01-02 NOTE — Telephone Encounter (Signed)
Message copied by Azalee CourseFULP, ASHLEY on Wed Jan 02, 2014 10:55 AM ------      Message from: Bennie PieriniMARTIN, MARY-MARGARET      Created: Fri Dec 28, 2013  4:37 PM       Hgba1c discussed at appointment      Kidney and liver function stable      Cholesterol looks ok      PSA normal      Continue current meds- low fat diet and exercise and recheck in 3 months       ------

## 2014-03-04 ENCOUNTER — Telehealth: Payer: Self-pay | Admitting: Nurse Practitioner

## 2014-03-04 ENCOUNTER — Other Ambulatory Visit: Payer: Self-pay | Admitting: *Deleted

## 2014-03-04 DIAGNOSIS — E1110 Type 2 diabetes mellitus with ketoacidosis without coma: Secondary | ICD-10-CM

## 2014-03-04 MED ORDER — SITAGLIPTIN PHOS-METFORMIN HCL 50-1000 MG PO TABS: ORAL_TABLET | ORAL | Status: DC

## 2014-03-04 NOTE — Telephone Encounter (Signed)
Patient needed a refill on janumet and rx was sent into pharmacy

## 2014-03-05 NOTE — Telephone Encounter (Signed)
Pt needs something cheaper to replace Janumet Please advise

## 2014-03-05 NOTE — Telephone Encounter (Signed)
i need to know which med he is talking about

## 2014-03-06 ENCOUNTER — Other Ambulatory Visit: Payer: Self-pay | Admitting: Nurse Practitioner

## 2014-03-06 MED ORDER — GLIMEPIRIDE 4 MG PO TABS: 4.0000 mg | ORAL_TABLET | Freq: Every day | ORAL | Status: DC

## 2014-03-06 MED ORDER — METFORMIN HCL 1000 MG PO TABS: 1000.0000 mg | ORAL_TABLET | Freq: Two times a day (BID) | ORAL | Status: DC

## 2014-03-06 NOTE — Telephone Encounter (Signed)
Duplicate

## 2014-03-06 NOTE — Telephone Encounter (Signed)
Lets try stopping janumet- will subsitute with metformin and amaryl- need to see him in 3  months

## 2014-03-06 NOTE — Telephone Encounter (Signed)
Aware, new scripts sent in.

## 2014-04-01 ENCOUNTER — Encounter: Payer: Self-pay | Admitting: Nurse Practitioner

## 2014-04-01 ENCOUNTER — Ambulatory Visit (INDEPENDENT_AMBULATORY_CARE_PROVIDER_SITE_OTHER): Payer: Medicare Other | Admitting: Nurse Practitioner

## 2014-04-01 VITALS — BP 130/68 | HR 58 | Temp 97.1°F | Ht 60.0 in | Wt 155.0 lb

## 2014-04-01 DIAGNOSIS — I10 Essential (primary) hypertension: Secondary | ICD-10-CM

## 2014-04-01 DIAGNOSIS — E1310 Other specified diabetes mellitus with ketoacidosis without coma: Secondary | ICD-10-CM | POA: Diagnosis not present

## 2014-04-01 DIAGNOSIS — E1110 Type 2 diabetes mellitus with ketoacidosis without coma: Secondary | ICD-10-CM

## 2014-04-01 LAB — POCT GLYCOSYLATED HEMOGLOBIN (HGB A1C): Hemoglobin A1C: 7.8

## 2014-04-01 LAB — POCT UA - MICROALBUMIN: Microalbumin Ur, POC: 50 mg/L

## 2014-04-01 MED ORDER — LISINOPRIL 5 MG PO TABS
10.0000 mg | ORAL_TABLET | Freq: Every day | ORAL | Status: DC
Start: 2014-04-01 — End: 2014-10-09

## 2014-04-01 MED ORDER — METFORMIN HCL 1000 MG PO TABS
1000.0000 mg | ORAL_TABLET | Freq: Two times a day (BID) | ORAL | Status: DC
Start: 2014-04-01 — End: 2014-07-04

## 2014-04-01 MED ORDER — GLIPIZIDE 10 MG PO TABS: ORAL_TABLET | ORAL | Status: DC

## 2014-04-01 NOTE — Patient Instructions (Signed)
Recuento bsico de carbohidratos para la diabetes mellitus (Basic Carbohydrate Counting for Diabetes Mellitus) El recuento de carbohidratos es un mtodo destinado a calcular la cantidad de carbohidratos en la dieta. El consumo de carbohidratos aumenta naturalmente el nivel de azcar (glucosa) en la sangre, por lo que es importante que sepa la cantidad que debe incluir en cada comida. El recuento de carbohidratos ayuda a mantener el nivel de glucosa en la sangre dentro de los lmites normales. La cantidad permitida de carbohidratos es diferente para cada persona. Un nutricionista puede ayudarlo a calcular la cantidad adecuada para usted. Una vez que sepa la cantidad de carbohidratos que puede consumir, podr calcular los carbohidratos de los alimentos que desea comer. Los siguientes alimentos incluyen carbohidratos:  Granos, como panes y cereales.  Frijoles secos y productos con soja.  Vegetales almidonados, como papas, guisantes y maz.  Frutas y jugos de frutas.  Leche y yogur.  Dulces y bocadillos, como pastel, galletas, caramelos, papas fritas de bolsa, refrescos y bebidas frutales con azcar. RECUENTO DE CARBOHIDRATOS Hay dos maneras de calcular los carbohidratos de los alimentos. Puede usar cualquiera de los dos mtodos o una combinacin de ambos. Leer la etiqueta de informacin nutricional de los alimentos envasados La informacin nutricional es una etiqueta incluida en casi todas las bebidas y los alimentos envasados de los [place]. Indica el tamao de la porcin de ese alimento o bebida e informacin sobre los nutrientes de cada porcin, incluso los gramos (g) de carbohidratos por porcin.  Decida la cantidad de porciones que comer o tomar de este alimento o bebida. Multiplique la cantidad de porciones por el nmero de gramos de carbohidratos indicados en la etiqueta para esa porcin. El total ser la cantidad de carbohidratos que consumir al comer ese alimento o tomar esa  bebida. Conocer las porciones estndar de los alimentos Cuando coma alimentos no envasados o que no incluyan la informacin nutricional en la etiqueta, deber medir las porciones para poder calcular la cantidad de carbohidratos. Una porcin de la mayora de los alimentos ricos en carbohidratos contiene alrededor de 15g de carbohidratos. La siguiente lista incluye los tamaos de porcin de los alimentos ricos en carbohidratos que contienen alrededor de 15g de carbohidratos por porcin:   1rebanada de pan (1oz) o 1tortilla de seis pulgadas.  panecillo de hamburguesa o bollito tipo ingls.  4a 6galletas.   de taza de cereal sin azcar y seco.   taza de cereal caliente.   de taza de arroz o pastas.  taza de pur de papas o de una papa grande al horno.  1taza de frutas frescas o una fruta pequea.  taza de frutas o jugo de frutas enlatados o congelados.  1 taza de leche.   de taza de yogur descremado sin ningn agregado o de yogur endulzado con edulcorante artificial.  taza de vegetales almidonados, como guisantes, maz o papas, o de frijoles secos cocidos. Decida la cantidad de porciones estndar que comer. Multiplique la cantidad de porciones por 15 (los gramos de carbohidratos en esa porcin). Por ejemplo, si come 2tazas de fresas, habr comido 2porciones y 30g de carbohidratos (2porciones x 15g = 30g). Para las comidas como sopas y guisos, en las que se mezcla ms de un alimento, deber contar los carbohidratos de cada alimento incluido. EJEMPLO DE RECUENTO DE CARBOHIDRATOS Ejemplo de cena  3 onzas de pechugas de pollo.   de taza de arroz integral.   taza de maz.  1 taza de leche.  1 taza de fresas   con crema batida sin azcar. Clculo de carbohidratos Paso 1: Identifique los alimentos que contienen carbohidratos:   Arroz.  Maz.  Leche.  Fresas. Paso 2: Calcule el nmero de porciones que consumir de cada uno:   2 porciones de  arroz.  1 porcin de maz.  1 porcin de leche.  1 porcin de fresas. Paso 3: Multiplique cada una de esas porciones por 15g:   2 porciones de arroz x 15 g = 30 g.  1 porcin de maz x 15 g = 15 g.  1 porcin de leche x 15 g = 15 g.  1 porcin de fresas x 15 g = 15 g. Paso 4: Sume todas las cantidades para conocer el total de gramos de carbohidratos consumidos: 30 g + 15 g + 15 g + 15 g = 75 g. Document Released: 05/31/2011 Document Revised: 07/23/2013 Exit[employer] Patient Information 2015 [employer]. This information is not intended to replace advice given to you by your health care provider. Make sure you discuss any questions you have with your health care provider.  

## 2014-04-01 NOTE — Progress Notes (Signed)
Subjective:    Patient ID: Roberto Miller, male    DOB: 06/25/1931, 81 y.o.   MRN: [id number]  Hypertension This is a chronic problem. The current episode started more than 1 year ago. The problem is controlled. Risk factors for coronary artery disease include diabetes mellitus and male gender. Past treatments include ACE inhibitors. The current treatment provides moderate improvement. Compliance problems include diet and exercise.   Diabetes He presents for his follow-up diabetic visit. He has type 2 diabetes mellitus. No MedicAlert identification noted. His disease course has been stable. There are no hypoglycemic associated symptoms. There are no diabetic associated symptoms. There are no diabetic complications. Risk factors for coronary artery disease include diabetes mellitus, hypertension and male sex. Current diabetic treatment includes oral agent (monotherapy). He is compliant with treatment most of the time. When asked about meal planning, he reported none. He has not had a previous visit with a dietitian. He rarely participates in exercise. His breakfast blood glucose is taken between 9-10 am. His breakfast blood glucose range is generally 110-130 mg/dl. His overall blood glucose range is 110-130 mg/dl. An ACE inhibitor/angiotensin II receptor blocker is being taken. He does not see a podiatrist.Eye exam is not current.      Review of Systems  Constitutional: Negative.   HENT: Negative.   Respiratory: Negative.   Cardiovascular: Negative.   Gastrointestinal: Negative.   Genitourinary: Negative.   Neurological: Negative.   Psychiatric/Behavioral: Negative.   All other systems reviewed and are negative.      Objective:   Physical Exam  Constitutional: He is oriented to person, place, and time. He appears well-developed and well-nourished.  HENT:  Head: Normocephalic.  Right Ear: External ear normal.  Left Ear: External ear normal.  Nose: Nose normal.  Mouth/Throat:  Oropharynx is clear and moist.  Eyes: EOM are normal. Pupils are equal, round, and reactive to light.  Neck: Normal range of motion. Neck supple. No JVD present. No thyromegaly present.  Cardiovascular: Normal rate, regular rhythm, normal heart sounds and intact distal pulses.  Exam reveals no gallop and no friction rub.   No murmur heard. Pulmonary/Chest: Effort normal and breath sounds normal. No respiratory distress. He has no wheezes. He has no rales. He exhibits no tenderness.  Abdominal: Soft. Bowel sounds are normal. He exhibits no mass. There is no tenderness.  Genitourinary: Prostate normal and penis normal.  Musculoskeletal: Normal range of motion. He exhibits no edema.  Lymphadenopathy:    He has no cervical adenopathy.  Neurological: He is alert and oriented to person, place, and time. No cranial nerve deficit.  Skin: Skin is warm and dry.  Psychiatric: He has a normal mood and affect. His behavior is normal. Judgment and thought content normal.   BP 130/68 mmHg  Pulse 58  Temp(Src) 97.1 F (36.2 C) (Oral)  Ht 5' (1.524 m)  Wt 155 lb (70.308 kg)  BMI 30.27 kg/m2   Results for orders placed or performed in visit on 04/01/14  POCT glycosylated hemoglobin (Hb A1C)  Result Value Ref Range   Hemoglobin A1C 7.8%   POCT UA - Microalbumin  Result Value Ref Range   Microalbumin Ur, POC 50 mg/L        Assessment & Plan:    1. Essential hypertension Do not add salt to diet - CMP14+EGFR - lisinopril (PRINIVIL,ZESTRIL) 5 MG tablet; Take 2 tablets (10 mg total) by mouth daily.  Dispense: 90 tablet; Refill: 1  2. DM (diabetes mellitus) type 2, uncontrolled,  with ketoacidosis Stricter carb counting - POCT glycosylated hemoglobin (Hb A1C) - NMR, lipoprofile - POCT UA - Microalbumin - glipiZIDE (GLUCOTROL) 10 MG tablet; TAKE ONE TABLET TWICE A DAY WITH FOOD  Dispense: 90 tablet; Refill: 1 - metFORMIN (GLUCOPHAGE) 1000 MG tablet; Take 1 tablet (1,000 mg total) by mouth 2  (two) times daily with a meal.  Dispense: 180 tablet; Refill: 1    Labs pending Health maintenance reviewed Diet and exercise encouraged Continue all meds Follow up  In 3 months   Roann, FNP

## 2014-04-01 NOTE — Addendum Note (Signed)
Addended by: Tommas OlpHANDY, Frederika Hukill N on: 04/01/2014 04:10 PM   Modules accepted: Orders

## 2014-04-02 LAB — NMR, LIPOPROFILE
Cholesterol: 163 mg/dL (ref 100–199)
HDL Cholesterol by NMR: 42 mg/dL (ref >39)
HDL Particle Number: 30.9 umol/L (ref >=30.5)
LDL Particle Number: 1046 nmol/L — ABNORMAL HIGH (ref <1000)
LDL Size: 20.5 nm (ref >20.5)
LDL-C: 55 mg/dL (ref 0–99)
LP-IR Score: 85 — ABNORMAL HIGH (ref <=45)
Small LDL Particle Number: 443 nmol/L (ref <=527)
Triglycerides by NMR: 329 mg/dL — ABNORMAL HIGH (ref 0–149)

## 2014-04-02 LAB — CMP14+EGFR
ALT: 16 IU/L (ref 0–44)
AST: 18 IU/L (ref 0–40)
Albumin/Globulin Ratio: 1.8 (ref 1.1–2.5)
Albumin: 4.2 g/dL (ref 3.5–4.7)
Alkaline Phosphatase: 81 IU/L (ref 39–117)
BUN/Creatinine Ratio: 28 — ABNORMAL HIGH (ref 10–22)
BUN: 19 mg/dL (ref 8–27)
CO2: 23 mmol/L (ref 18–29)
Calcium: 8.9 mg/dL (ref 8.6–10.2)
Chloride: 101 mmol/L (ref 97–108)
Creatinine, Ser: 0.69 mg/dL — ABNORMAL LOW (ref 0.76–1.27)
GFR calc Af Amer: 104 mL/min/{1.73_m2} (ref >59)
GFR calc non Af Amer: 90 mL/min/{1.73_m2} (ref >59)
Globulin, Total: 2.3 g/dL (ref 1.5–4.5)
Glucose: 260 mg/dL — ABNORMAL HIGH (ref 65–99)
Potassium: 4.3 mmol/L (ref 3.5–5.2)
Sodium: 138 mmol/L (ref 134–144)
Total Bilirubin: 0.2 mg/dL (ref 0.0–1.2)
Total Protein: 6.5 g/dL (ref 6.0–8.5)

## 2014-04-02 LAB — MICROALBUMIN, URINE: Microalbumin, Urine: 175.5 ug/mL — ABNORMAL HIGH (ref 0.0–17.0)

## 2014-07-04 ENCOUNTER — Ambulatory Visit (INDEPENDENT_AMBULATORY_CARE_PROVIDER_SITE_OTHER): Payer: Medicare Other | Admitting: Nurse Practitioner

## 2014-07-04 ENCOUNTER — Encounter: Payer: Self-pay | Admitting: Nurse Practitioner

## 2014-07-04 VITALS — BP 118/66 | HR 66 | Temp 97.6°F | Ht 60.0 in | Wt 154.0 lb

## 2014-07-04 DIAGNOSIS — E1310 Other specified diabetes mellitus with ketoacidosis without coma: Secondary | ICD-10-CM

## 2014-07-04 DIAGNOSIS — E1110 Type 2 diabetes mellitus with ketoacidosis without coma: Secondary | ICD-10-CM

## 2014-07-04 DIAGNOSIS — I10 Essential (primary) hypertension: Secondary | ICD-10-CM | POA: Diagnosis not present

## 2014-07-04 DIAGNOSIS — Z23 Encounter for immunization: Secondary | ICD-10-CM | POA: Diagnosis not present

## 2014-07-04 LAB — POCT GLYCOSYLATED HEMOGLOBIN (HGB A1C): Hemoglobin A1C: 7.9

## 2014-07-04 MED ORDER — SITAGLIPTIN PHOS-METFORMIN HCL 50-1000 MG PO TABS: 1.0000 | ORAL_TABLET | Freq: Two times a day (BID) | ORAL | Status: DC

## 2014-07-04 NOTE — Patient Instructions (Signed)
Recuento bsico de carbohidratos para la diabetes mellitus (Basic Carbohydrate Counting for Diabetes Mellitus) El recuento de carbohidratos es un mtodo destinado a calcular la cantidad de carbohidratos en la dieta. El consumo de carbohidratos aumenta naturalmente el nivel de azcar (glucosa) en la sangre, por lo que es importante que sepa la cantidad que debe incluir en cada comida. El recuento de carbohidratos ayuda a mantener el nivel de glucosa en la sangre dentro de los lmites normales. La cantidad permitida de carbohidratos es diferente para cada persona. Un nutricionista puede ayudarlo a calcular la cantidad adecuada para usted. Una vez que sepa la cantidad de carbohidratos que puede consumir, podr calcular los carbohidratos de los alimentos que desea comer. Los siguientes alimentos incluyen carbohidratos:  Granos, como panes y cereales.  Frijoles secos y productos con soja.  Vegetales almidonados, como papas, guisantes y maz.  Frutas y jugos de frutas.  Leche y yogur.  Dulces y bocadillos, como pastel, galletas, caramelos, papas fritas de bolsa, refrescos y bebidas frutales con azcar. RECUENTO DE CARBOHIDRATOS Hay dos maneras de calcular los carbohidratos de los alimentos. Puede usar cualquiera de los dos mtodos o una combinacin de ambos. Leer la etiqueta de informacin nutricional de los alimentos envasados La informacin nutricional es una etiqueta incluida en casi todas las bebidas y los alimentos envasados de los [place]. Indica el tamao de la porcin de ese alimento o bebida e informacin sobre los nutrientes de cada porcin, incluso los gramos (g) de carbohidratos por porcin.  Decida la cantidad de porciones que comer o tomar de este alimento o bebida. Multiplique la cantidad de porciones por el nmero de gramos de carbohidratos indicados en la etiqueta para esa porcin. El total ser la cantidad de carbohidratos que consumir al comer ese alimento o tomar esa  bebida. Conocer las porciones estndar de los alimentos Cuando coma alimentos no envasados o que no incluyan la informacin nutricional en la etiqueta, deber medir las porciones para poder calcular la cantidad de carbohidratos. Una porcin de la mayora de los alimentos ricos en carbohidratos contiene alrededor de 15g de carbohidratos. La siguiente lista incluye los tamaos de porcin de los alimentos ricos en carbohidratos que contienen alrededor de 15g de carbohidratos por porcin:   1rebanada de pan (1oz) o 1tortilla de seis pulgadas.  panecillo de hamburguesa o bollito tipo ingls.  4a 6galletas.   de taza de cereal sin azcar y seco.   taza de cereal caliente.   de taza de arroz o pastas.  taza de pur de papas o de una papa grande al horno.  1taza de frutas frescas o una fruta pequea.  taza de frutas o jugo de frutas enlatados o congelados.  1 taza de leche.   de taza de yogur descremado sin ningn agregado o de yogur endulzado con edulcorante artificial.  taza de vegetales almidonados, como guisantes, maz o papas, o de frijoles secos cocidos. Decida la cantidad de porciones estndar que comer. Multiplique la cantidad de porciones por 15 (los gramos de carbohidratos en esa porcin). Por ejemplo, si come 2tazas de fresas, habr comido 2porciones y 30g de carbohidratos (2porciones x 15g = 30g). Para las comidas como sopas y guisos, en las que se mezcla ms de un alimento, deber contar los carbohidratos de cada alimento incluido. EJEMPLO DE RECUENTO DE CARBOHIDRATOS Ejemplo de cena  3 onzas de pechugas de pollo.   de taza de arroz integral.   taza de maz.  1 taza de leche.  1 taza de fresas   con crema batida sin azcar. Clculo de carbohidratos Paso 1: Identifique los alimentos que contienen carbohidratos:   Arroz.  Maz.  Leche.  Fresas. Paso 2: Calcule el nmero de porciones que consumir de cada uno:   2 porciones de  arroz.  1 porcin de maz.  1 porcin de leche.  1 porcin de fresas. Paso 3: Multiplique cada una de esas porciones por 15g:   2 porciones de arroz x 15 g = 30 g.  1 porcin de maz x 15 g = 15 g.  1 porcin de leche x 15 g = 15 g.  1 porcin de fresas x 15 g = 15 g. Paso 4: Sume todas las cantidades para conocer el total de gramos de carbohidratos consumidos: 30 g + 15 g + 15 g + 15 g = 75 g. Document Released: 05/31/2011 Document Revised: 07/23/2013 Exit[employer] Patient Information 2015 [employer]. This information is not intended to replace advice given to you by your health care provider. Make sure you discuss any questions you have with your health care provider.  

## 2014-07-04 NOTE — Addendum Note (Signed)
Addended by: Cleda DaubUCKER, Nikko Quast G on: 07/04/2014 04:36 PM   Modules accepted: Orders

## 2014-07-04 NOTE — Progress Notes (Signed)
Subjective:    Patient ID: Roberto Miller, male    DOB: Apr 22, 1931, 81 y.o.   MRN: [id number]  Hypertension This is a chronic problem. The current episode started more than 1 year ago. The problem is controlled. Risk factors for coronary artery disease include diabetes mellitus and male gender. Past treatments include ACE inhibitors. The current treatment provides moderate improvement. Compliance problems include diet and exercise.   Diabetes He presents for his follow-up diabetic visit. He has type 2 diabetes mellitus. No MedicAlert identification noted. His disease course has been stable. There are no hypoglycemic associated symptoms. There are no diabetic associated symptoms. There are no diabetic complications. Risk factors for coronary artery disease include diabetes mellitus, hypertension and male sex. Current diabetic treatment includes oral agent (monotherapy). He is compliant with treatment most of the time. When asked about meal planning, he reported none. He has not had a previous visit with a dietitian. He rarely participates in exercise. His breakfast blood glucose is taken between 9-10 am. His breakfast blood glucose range is generally 110-130 mg/dl. His overall blood glucose range is 110-130 mg/dl. An ACE inhibitor/angiotensin II receptor blocker is being taken. He does not see a podiatrist.Eye exam is not current.      Review of Systems  Constitutional: Negative.   HENT: Negative.   Respiratory: Negative.   Cardiovascular: Negative.   Gastrointestinal: Negative.   Genitourinary: Negative.   Neurological: Negative.   Psychiatric/Behavioral: Negative.   All other systems reviewed and are negative.      Objective:   Physical Exam  Constitutional: He is oriented to person, place, and time. He appears well-developed and well-nourished.  HENT:  Head: Normocephalic.  Right Ear: External ear normal.  Left Ear: External ear normal.  Nose: Nose normal.  Mouth/Throat:  Oropharynx is clear and moist.  Eyes: EOM are normal. Pupils are equal, round, and reactive to light.  Neck: Normal range of motion. Neck supple. No JVD present. No thyromegaly present.  Cardiovascular: Normal rate, regular rhythm, normal heart sounds and intact distal pulses.  Exam reveals no gallop and no friction rub.   No murmur heard. Pulmonary/Chest: Effort normal and breath sounds normal. No respiratory distress. He has no wheezes. He has no rales. He exhibits no tenderness.  Abdominal: Soft. Bowel sounds are normal. He exhibits no mass. There is no tenderness.  Genitourinary: Prostate normal and penis normal.  Musculoskeletal: Normal range of motion. He exhibits no edema.  Lymphadenopathy:    He has no cervical adenopathy.  Neurological: He is alert and oriented to person, place, and time. No cranial nerve deficit.  Skin: Skin is warm and dry.  Psychiatric: He has a normal mood and affect. His behavior is normal. Judgment and thought content normal.   BP 118/66 mmHg  Pulse 66  Temp(Src) 97.6 F (36.4 C) (Oral)  Ht 5' (1.524 m)  Wt 154 lb (69.854 kg)  BMI 30.08 kg/m2    Results for orders placed or performed in visit on 07/04/14  POCT glycosylated hemoglobin (Hb A1C)  Result Value Ref Range   Hemoglobin A1C 7.9           Assessment & Plan:    1. Essential hypertension Do not add salt to diet - CMP14+EGFR - NMR, lipoprofile  2. DM (diabetes mellitus) type 2, uncontrolled, with ketoacidosis Stop metformin Added janumet BID Stricter carb counting - POCT glycosylated hemoglobin (Hb A1C) - sitaGLIPtin-metformin (JANUMET) 50-1000 MG per tablet; Take 1 tablet by mouth 2 (two) times daily  with a meal.  Dispense: 60 tablet; Refill: 3    Labs pending Health maintenance reviewed Diet and exercise encouraged Continue all meds Follow up  In 3 month   [place], FNP

## 2014-07-05 LAB — NMR, LIPOPROFILE
Cholesterol: 170 mg/dL (ref 100–199)
HDL Cholesterol by NMR: 45 mg/dL (ref >39)
HDL Particle Number: 30.8 umol/L (ref >=30.5)
LDL Particle Number: 1095 nmol/L — ABNORMAL HIGH (ref <1000)
LDL Size: 20.4 nm (ref >20.5)
LDL-C: 68 mg/dL (ref 0–99)
LP-IR Score: 73 — ABNORMAL HIGH (ref <=45)
Small LDL Particle Number: 519 nmol/L (ref <=527)
Triglycerides by NMR: 286 mg/dL — ABNORMAL HIGH (ref 0–149)

## 2014-07-05 LAB — CMP14+EGFR
ALT: 9 IU/L (ref 0–44)
AST: 14 IU/L (ref 0–40)
Albumin/Globulin Ratio: 1.6 (ref 1.1–2.5)
Albumin: 4.2 g/dL (ref 3.5–4.7)
Alkaline Phosphatase: 81 IU/L (ref 39–117)
BUN/Creatinine Ratio: 23 — ABNORMAL HIGH (ref 10–22)
BUN: 19 mg/dL (ref 8–27)
Bilirubin Total: 0.4 mg/dL (ref 0.0–1.2)
CO2: 23 mmol/L (ref 18–29)
Calcium: 9.1 mg/dL (ref 8.6–10.2)
Chloride: 102 mmol/L (ref 97–108)
Creatinine, Ser: 0.81 mg/dL (ref 0.76–1.27)
GFR calc Af Amer: 97 mL/min/{1.73_m2} (ref >59)
GFR calc non Af Amer: 84 mL/min/{1.73_m2} (ref >59)
Globulin, Total: 2.6 g/dL (ref 1.5–4.5)
Glucose: 315 mg/dL — ABNORMAL HIGH (ref 65–99)
Potassium: 4.7 mmol/L (ref 3.5–5.2)
Sodium: 137 mmol/L (ref 134–144)
Total Protein: 6.8 g/dL (ref 6.0–8.5)

## 2014-09-09 ENCOUNTER — Other Ambulatory Visit: Payer: Self-pay | Admitting: Nurse Practitioner

## 2014-10-09 ENCOUNTER — Ambulatory Visit (INDEPENDENT_AMBULATORY_CARE_PROVIDER_SITE_OTHER): Payer: Medicare Other

## 2014-10-09 ENCOUNTER — Ambulatory Visit (INDEPENDENT_AMBULATORY_CARE_PROVIDER_SITE_OTHER): Payer: Medicare Other | Admitting: Nurse Practitioner

## 2014-10-09 ENCOUNTER — Encounter: Payer: Self-pay | Admitting: Nurse Practitioner

## 2014-10-09 ENCOUNTER — Encounter (INDEPENDENT_AMBULATORY_CARE_PROVIDER_SITE_OTHER): Payer: Self-pay

## 2014-10-09 VITALS — BP 114/65 | HR 57 | Temp 97.1°F | Ht 60.0 in | Wt 155.0 lb

## 2014-10-09 DIAGNOSIS — I10 Essential (primary) hypertension: Secondary | ICD-10-CM

## 2014-10-09 DIAGNOSIS — Z6830 Body mass index (BMI) 30.0-30.9, adult: Secondary | ICD-10-CM | POA: Diagnosis not present

## 2014-10-09 DIAGNOSIS — E119 Type 2 diabetes mellitus without complications: Secondary | ICD-10-CM | POA: Diagnosis not present

## 2014-10-09 LAB — POCT GLYCOSYLATED HEMOGLOBIN (HGB A1C): Hemoglobin A1C: 7.8

## 2014-10-09 MED ORDER — LISINOPRIL 5 MG PO TABS: 10.0000 mg | ORAL_TABLET | Freq: Every day | ORAL | Status: DC

## 2014-10-09 MED ORDER — CLOTRIMAZOLE-BETAMETHASONE 1-0.05 % EX CREA: 1.0000 "application " | TOPICAL_CREAM | Freq: Two times a day (BID) | CUTANEOUS | Status: DC

## 2014-10-09 MED ORDER — GLIPIZIDE 10 MG PO TABS: 10.0000 mg | ORAL_TABLET | Freq: Two times a day (BID) | ORAL | Status: DC

## 2014-10-09 MED ORDER — SITAGLIPTIN PHOS-METFORMIN HCL 50-1000 MG PO TABS: 1.0000 | ORAL_TABLET | Freq: Two times a day (BID) | ORAL | Status: DC

## 2014-10-09 NOTE — Patient Instructions (Signed)
Diabetes and Foot Care Diabetes may cause you to have problems because of poor blood supply (circulation) to your feet and legs. This may cause the skin on your feet to become thinner, break easier, and heal more slowly. Your skin may become dry, and the skin may peel and crack. You may also have nerve damage in your legs and feet causing decreased feeling in them. You may not notice minor injuries to your feet that could lead to infections or more serious problems. Taking care of your feet is one of the most important things you can do for yourself.  HOME CARE INSTRUCTIONS  Wear shoes at all times, even in the house. Do not go barefoot. Bare feet are easily injured.  Check your feet daily for blisters, cuts, and redness. If you cannot see the bottom of your feet, use a mirror or ask someone for help.  Wash your feet with warm water (do not use hot water) and mild soap. Then pat your feet and the areas between your toes until they are completely dry. Do not soak your feet as this can dry your skin.  Apply a moisturizing lotion or petroleum jelly (that does not contain alcohol and is unscented) to the skin on your feet and to dry, brittle toenails. Do not apply lotion between your toes.  Trim your toenails straight across. Do not dig under them or around the cuticle. File the edges of your nails with an emery board or nail file.  Do not cut corns or calluses or try to remove them with medicine.  Wear clean socks or stockings every day. Make sure they are not too tight. Do not wear knee-high stockings since they may decrease blood flow to your legs.  Wear shoes that fit properly and have enough cushioning. To break in new shoes, wear them for just a few hours a day. This prevents you from injuring your feet. Always look in your shoes before you put them on to be sure there are no objects inside.  Do not cross your legs. This may decrease the blood flow to your feet.  If you find a minor scrape,  cut, or break in the skin on your feet, keep it and the skin around it clean and dry. These areas may be cleansed with mild soap and water. Do not cleanse the area with peroxide, alcohol, or iodine.  When you remove an adhesive bandage, be sure not to damage the skin around it.  If you have a wound, look at it several times a day to make sure it is healing.  Do not use heating pads or hot water bottles. They may burn your skin. If you have lost feeling in your feet or legs, you may not know it is happening until it is too late.  Make sure your health care provider performs a complete foot exam at least annually or more often if you have foot problems. Report any cuts, sores, or bruises to your health care provider immediately. SEEK MEDICAL CARE IF:   You have an injury that is not healing.  You have cuts or breaks in the skin.  You have an ingrown nail.  You notice redness on your legs or feet.  You feel burning or tingling in your legs or feet.  You have pain or cramps in your legs and feet.  Your legs or feet are numb.  Your feet always feel cold. SEEK IMMEDIATE MEDICAL CARE IF:   There is increasing redness,   swelling, or pain in or around a wound.  There is a red line that goes up your leg.  Pus is coming from a wound.  You develop a fever or as directed by your health care provider.  You notice a bad smell coming from an ulcer or wound. Document Released: 03/05/2000 Document Revised: 11/08/2012 Document Reviewed: 08/15/2012 [employer] Patient Information 2015 [employer]. This information is not intended to replace advice given to you by your health care provider. Make sure you discuss any questions you have with your health care provider.  

## 2014-10-09 NOTE — Progress Notes (Signed)
Subjective:    Patient ID: Roberto Miller, male    DOB: 1931/12/12, 81 y.o.   MRN: [id number]   Patient here today for follow up of chronic medical problems.   Hypertension This is a chronic problem. The current episode started more than 1 year ago. The problem is controlled. Risk factors for coronary artery disease include diabetes mellitus and male gender. Past treatments include ACE inhibitors. The current treatment provides moderate improvement. Compliance problems include diet and exercise.   Diabetes He presents for his follow-up diabetic visit. He has type 2 diabetes mellitus. No MedicAlert identification noted. His disease course has been stable. There are no hypoglycemic associated symptoms. There are no diabetic associated symptoms. There are no diabetic complications. Risk factors for coronary artery disease include diabetes mellitus, hypertension and male sex. Current diabetic treatment includes oral agent (monotherapy). He is compliant with treatment most of the time. When asked about meal planning, he reported none. He has not had a previous visit with a dietitian. He rarely participates in exercise. His breakfast blood glucose is taken between 9-10 am. His breakfast blood glucose range is generally 110-130 mg/dl. His overall blood glucose range is 110-130 mg/dl. An ACE inhibitor/angiotensin II receptor blocker is being taken. He does not see a podiatrist.Eye exam is not current.      Review of Systems  Constitutional: Negative.   HENT: Negative.   Respiratory: Negative.   Cardiovascular: Negative.   Gastrointestinal: Negative.   Genitourinary: Negative.   Neurological: Negative.   Psychiatric/Behavioral: Negative.   All other systems reviewed and are negative.      Objective:   Physical Exam  Constitutional: He is oriented to person, place, and time. He appears well-developed and well-nourished.  HENT:  Head: Normocephalic.  Right Ear: External ear normal.  Left  Ear: External ear normal.  Nose: Nose normal.  Mouth/Throat: Oropharynx is clear and moist.  Eyes: EOM are normal. Pupils are equal, round, and reactive to light.  Neck: Normal range of motion. Neck supple. No JVD present. No thyromegaly present.  Cardiovascular: Normal rate, regular rhythm, normal heart sounds and intact distal pulses.  Exam reveals no gallop and no friction rub.   No murmur heard. Pulmonary/Chest: Effort normal and breath sounds normal. No respiratory distress. He has no wheezes. He has no rales. He exhibits no tenderness.  Abdominal: Soft. Bowel sounds are normal. He exhibits no mass. There is no tenderness.  Genitourinary: Prostate normal and penis normal.  Musculoskeletal: Normal range of motion. He exhibits no edema.  Lymphadenopathy:    He has no cervical adenopathy.  Neurological: He is alert and oriented to person, place, and time. No cranial nerve deficit.  Skin: Skin is warm and dry.  Psychiatric: He has a normal mood and affect. His behavior is normal. Judgment and thought content normal.    BP 114/65 mmHg  Pulse 57  Temp(Src) 97.1 F (36.2 C) (Oral)  Ht 5' (1.524 m)  Wt 155 lb (70.308 kg)  BMI 30.27 kg/m2    Results for orders placed or performed in visit on 10/09/14  POCT glycosylated hemoglobin (Hb A1C)  Result Value Ref Range   Hemoglobin A1C 7.8      Chest x ray- no cardiopulmonary abnormalities-Preliminary reading by Ronnald Collum, FNP  [facility]   EKG- sinus bradycardia-Mary-Margaret Hassell Done, FNP       Assessment & Plan:   1. Essential hypertension Do not add salt to diet - CMP14+EGFR - Lipid panel - lisinopril (PRINIVIL,ZESTRIL) 5 MG tablet;  Take 2 tablets (10 mg total) by mouth daily.  Dispense: 90 tablet; Refill: 1 - DG Chest 2 View; Future - EKG 12-Lead  2. Type 2 diabetes mellitus without complication Continue to watch carbs in diet - POCT glycosylated hemoglobin (Hb A1C) - sitaGLIPtin-metformin (JANUMET) 50-1000 MG per tablet;  Take 1 tablet by mouth 2 (two) times daily with a meal.  Dispense: 60 tablet; Refill: 5 - glipiZIDE (GLUCOTROL) 10 MG tablet; Take 1 tablet (10 mg total) by mouth 2 (two) times daily.  Dispense: 60 tablet; Refill: 5  3. BMI 30.0-30.9,adult Discussed diet and exercise for person with BMI >25 Will recheck weight in 3-6 months   Labs pending Health maintenance reviewed Diet and exercise encouraged Continue all meds Follow up  In 3 months   [place], FNP

## 2014-10-09 NOTE — Addendum Note (Signed)
Addended by: Bennie PieriniMARTIN, MARY-MARGARET on: 10/09/2014 03:32 PM   Modules accepted: Orders

## 2014-10-10 LAB — CMP14+EGFR
ALT: 20 IU/L (ref 0–44)
AST: 23 IU/L (ref 0–40)
Albumin/Globulin Ratio: 1.8 (ref 1.1–2.5)
Albumin: 4.4 g/dL (ref 3.5–4.7)
Alkaline Phosphatase: 75 IU/L (ref 39–117)
BUN/Creatinine Ratio: 23 — ABNORMAL HIGH (ref 10–22)
BUN: 19 mg/dL (ref 8–27)
Bilirubin Total: 0.3 mg/dL (ref 0.0–1.2)
CO2: 20 mmol/L (ref 18–29)
Calcium: 9.2 mg/dL (ref 8.6–10.2)
Chloride: 101 mmol/L (ref 97–108)
Creatinine, Ser: 0.83 mg/dL (ref 0.76–1.27)
GFR calc Af Amer: 95 mL/min/1.73
GFR calc non Af Amer: 83 mL/min/1.73
Globulin, Total: 2.5 g/dL (ref 1.5–4.5)
Glucose: 130 mg/dL — ABNORMAL HIGH (ref 65–99)
Potassium: 3.8 mmol/L (ref 3.5–5.2)
Sodium: 137 mmol/L (ref 134–144)
Total Protein: 6.9 g/dL (ref 6.0–8.5)

## 2014-10-10 LAB — LIPID PANEL
Chol/HDL Ratio: 3.3 ratio units (ref 0.0–5.0)
Cholesterol, Total: 170 mg/dL (ref 100–199)
HDL: 51 mg/dL (ref >39)
LDL Calculated: 93 mg/dL (ref 0–99)
Triglycerides: 132 mg/dL (ref 0–149)
VLDL Cholesterol Cal: 26 mg/dL (ref 5–40)

## 2015-01-31 DIAGNOSIS — Z961 Presence of intraocular lens: Secondary | ICD-10-CM | POA: Diagnosis not present

## 2015-01-31 DIAGNOSIS — H52223 Regular astigmatism, bilateral: Secondary | ICD-10-CM | POA: Diagnosis not present

## 2015-01-31 DIAGNOSIS — E119 Type 2 diabetes mellitus without complications: Secondary | ICD-10-CM | POA: Diagnosis not present

## 2015-01-31 DIAGNOSIS — H524 Presbyopia: Secondary | ICD-10-CM | POA: Diagnosis not present

## 2015-01-31 LAB — HM DIABETES EYE EXAM

## 2015-02-11 ENCOUNTER — Encounter (INDEPENDENT_AMBULATORY_CARE_PROVIDER_SITE_OTHER): Payer: Medicare Other | Admitting: Ophthalmology

## 2015-02-17 ENCOUNTER — Other Ambulatory Visit: Payer: Self-pay | Admitting: Nurse Practitioner

## 2015-02-20 DIAGNOSIS — R1010 Upper abdominal pain, unspecified: Secondary | ICD-10-CM | POA: Diagnosis not present

## 2015-02-20 DIAGNOSIS — R509 Fever, unspecified: Secondary | ICD-10-CM | POA: Diagnosis not present

## 2015-02-20 DIAGNOSIS — E860 Dehydration: Secondary | ICD-10-CM | POA: Diagnosis not present

## 2015-02-20 DIAGNOSIS — A4151 Sepsis due to Escherichia coli [E. coli]: Secondary | ICD-10-CM | POA: Diagnosis present

## 2015-02-20 DIAGNOSIS — E119 Type 2 diabetes mellitus without complications: Secondary | ICD-10-CM | POA: Diagnosis not present

## 2015-02-20 DIAGNOSIS — K8000 Calculus of gallbladder with acute cholecystitis without obstruction: Secondary | ICD-10-CM | POA: Diagnosis present

## 2015-02-20 DIAGNOSIS — R1013 Epigastric pain: Secondary | ICD-10-CM | POA: Diagnosis not present

## 2015-02-20 DIAGNOSIS — R000 Tachycardia, unspecified: Secondary | ICD-10-CM | POA: Diagnosis not present

## 2015-02-20 DIAGNOSIS — K8021 Calculus of gallbladder without cholecystitis with obstruction: Secondary | ICD-10-CM | POA: Diagnosis not present

## 2015-02-20 DIAGNOSIS — J9811 Atelectasis: Secondary | ICD-10-CM | POA: Diagnosis not present

## 2015-02-20 DIAGNOSIS — Z7984 Long term (current) use of oral hypoglycemic drugs: Secondary | ICD-10-CM | POA: Diagnosis not present

## 2015-02-20 DIAGNOSIS — Z23 Encounter for immunization: Secondary | ICD-10-CM | POA: Diagnosis not present

## 2015-02-20 DIAGNOSIS — A419 Sepsis, unspecified organism: Secondary | ICD-10-CM | POA: Diagnosis not present

## 2015-02-20 DIAGNOSIS — E785 Hyperlipidemia, unspecified: Secondary | ICD-10-CM | POA: Diagnosis present

## 2015-02-20 DIAGNOSIS — D696 Thrombocytopenia, unspecified: Secondary | ICD-10-CM | POA: Diagnosis present

## 2015-02-20 DIAGNOSIS — I4891 Unspecified atrial fibrillation: Secondary | ICD-10-CM | POA: Diagnosis present

## 2015-02-20 DIAGNOSIS — E876 Hypokalemia: Secondary | ICD-10-CM | POA: Diagnosis present

## 2015-02-20 DIAGNOSIS — Z79899 Other long term (current) drug therapy: Secondary | ICD-10-CM | POA: Diagnosis not present

## 2015-02-20 DIAGNOSIS — K851 Biliary acute pancreatitis without necrosis or infection: Secondary | ICD-10-CM | POA: Diagnosis not present

## 2015-02-20 DIAGNOSIS — K859 Acute pancreatitis without necrosis or infection, unspecified: Secondary | ICD-10-CM | POA: Diagnosis not present

## 2015-02-24 DIAGNOSIS — K469 Unspecified abdominal hernia without obstruction or gangrene: Secondary | ICD-10-CM | POA: Insufficient documentation

## 2015-02-24 DIAGNOSIS — I452 Bifascicular block: Secondary | ICD-10-CM | POA: Diagnosis not present

## 2015-02-24 DIAGNOSIS — A419 Sepsis, unspecified organism: Secondary | ICD-10-CM | POA: Diagnosis not present

## 2015-02-24 DIAGNOSIS — I4510 Unspecified right bundle-branch block: Secondary | ICD-10-CM | POA: Diagnosis not present

## 2015-02-24 DIAGNOSIS — J90 Pleural effusion, not elsewhere classified: Secondary | ICD-10-CM | POA: Diagnosis not present

## 2015-02-24 DIAGNOSIS — R7881 Bacteremia: Secondary | ICD-10-CM | POA: Diagnosis present

## 2015-02-24 DIAGNOSIS — D696 Thrombocytopenia, unspecified: Secondary | ICD-10-CM | POA: Diagnosis not present

## 2015-02-24 DIAGNOSIS — K8020 Calculus of gallbladder without cholecystitis without obstruction: Secondary | ICD-10-CM | POA: Insufficient documentation

## 2015-02-24 DIAGNOSIS — B9620 Unspecified Escherichia coli [E. coli] as the cause of diseases classified elsewhere: Secondary | ICD-10-CM | POA: Diagnosis present

## 2015-02-24 DIAGNOSIS — K851 Biliary acute pancreatitis without necrosis or infection: Secondary | ICD-10-CM | POA: Diagnosis not present

## 2015-02-24 DIAGNOSIS — I4891 Unspecified atrial fibrillation: Secondary | ICD-10-CM | POA: Diagnosis not present

## 2015-02-24 DIAGNOSIS — Z9981 Dependence on supplemental oxygen: Secondary | ICD-10-CM | POA: Diagnosis not present

## 2015-02-24 DIAGNOSIS — A498 Other bacterial infections of unspecified site: Secondary | ICD-10-CM | POA: Diagnosis not present

## 2015-02-24 DIAGNOSIS — D6959 Other secondary thrombocytopenia: Secondary | ICD-10-CM | POA: Diagnosis present

## 2015-02-24 DIAGNOSIS — D509 Iron deficiency anemia, unspecified: Secondary | ICD-10-CM | POA: Diagnosis not present

## 2015-02-24 DIAGNOSIS — D649 Anemia, unspecified: Secondary | ICD-10-CM | POA: Diagnosis not present

## 2015-02-24 DIAGNOSIS — E119 Type 2 diabetes mellitus without complications: Secondary | ICD-10-CM | POA: Diagnosis not present

## 2015-02-24 DIAGNOSIS — A4151 Sepsis due to Escherichia coli [E. coli]: Secondary | ICD-10-CM | POA: Diagnosis not present

## 2015-02-24 DIAGNOSIS — Z7401 Bed confinement status: Secondary | ICD-10-CM | POA: Diagnosis not present

## 2015-02-24 DIAGNOSIS — Z23 Encounter for immunization: Secondary | ICD-10-CM | POA: Diagnosis not present

## 2015-02-24 DIAGNOSIS — I10 Essential (primary) hypertension: Secondary | ICD-10-CM | POA: Diagnosis not present

## 2015-02-24 DIAGNOSIS — I480 Paroxysmal atrial fibrillation: Secondary | ICD-10-CM | POA: Diagnosis not present

## 2015-02-24 DIAGNOSIS — Z87891 Personal history of nicotine dependence: Secondary | ICD-10-CM | POA: Diagnosis not present

## 2015-02-24 DIAGNOSIS — K760 Fatty (change of) liver, not elsewhere classified: Secondary | ICD-10-CM | POA: Diagnosis present

## 2015-03-03 ENCOUNTER — Telehealth: Payer: Self-pay | Admitting: Nurse Practitioner

## 2015-03-03 NOTE — Telephone Encounter (Signed)
Patient's daughter(Veronica aware that patient needs to come in on 03/08/15 to have labs drawn so we can have blood work back for appt on 03/10/15

## 2015-03-03 NOTE — Telephone Encounter (Signed)
Patient's daughter veronica aware that patient does not need to come in 12/17 just to come in on 12/19

## 2015-03-03 NOTE — Telephone Encounter (Signed)
No can wait till comes in for appointment to get labs so does not have to come over here twice

## 2015-03-03 NOTE — Telephone Encounter (Signed)
Spoke with pt's daughter to inform pt can come in for labs before appt so reults can be discussed

## 2015-03-06 ENCOUNTER — Encounter (INDEPENDENT_AMBULATORY_CARE_PROVIDER_SITE_OTHER): Payer: Medicare Other | Admitting: Ophthalmology

## 2015-03-10 ENCOUNTER — Ambulatory Visit (INDEPENDENT_AMBULATORY_CARE_PROVIDER_SITE_OTHER): Payer: Medicare Other | Admitting: Nurse Practitioner

## 2015-03-10 ENCOUNTER — Encounter: Payer: Self-pay | Admitting: Nurse Practitioner

## 2015-03-10 VITALS — BP 110/64 | HR 85 | Temp 98.9°F | Ht 60.0 in | Wt 150.0 lb

## 2015-03-10 DIAGNOSIS — Z6830 Body mass index (BMI) 30.0-30.9, adult: Secondary | ICD-10-CM | POA: Diagnosis not present

## 2015-03-10 DIAGNOSIS — I10 Essential (primary) hypertension: Secondary | ICD-10-CM

## 2015-03-10 DIAGNOSIS — D649 Anemia, unspecified: Secondary | ICD-10-CM

## 2015-03-10 DIAGNOSIS — D693 Immune thrombocytopenic purpura: Secondary | ICD-10-CM | POA: Diagnosis not present

## 2015-03-10 DIAGNOSIS — D529 Folate deficiency anemia, unspecified: Secondary | ICD-10-CM | POA: Diagnosis not present

## 2015-03-10 DIAGNOSIS — E119 Type 2 diabetes mellitus without complications: Secondary | ICD-10-CM

## 2015-03-10 LAB — POCT GLYCOSYLATED HEMOGLOBIN (HGB A1C): Hemoglobin A1C: 7.6

## 2015-03-10 MED ORDER — FERROUS SULFATE 325 (65 FE) MG PO TABS: 325.0000 mg | ORAL_TABLET | Freq: Every day | ORAL | Status: DC

## 2015-03-10 MED ORDER — LISINOPRIL 5 MG PO TABS: 10.0000 mg | ORAL_TABLET | Freq: Every day | ORAL | Status: DC

## 2015-03-10 MED ORDER — GLIPIZIDE 10 MG PO TABS: 10.0000 mg | ORAL_TABLET | Freq: Two times a day (BID) | ORAL | Status: DC

## 2015-03-10 MED ORDER — METOPROLOL TARTRATE 75 MG PO TABS: 75.0000 mg | ORAL_TABLET | Freq: Two times a day (BID) | ORAL | Status: DC

## 2015-03-10 MED ORDER — SITAGLIPTIN PHOS-METFORMIN HCL 50-1000 MG PO TABS: 1.0000 | ORAL_TABLET | Freq: Two times a day (BID) | ORAL | Status: DC

## 2015-03-10 NOTE — Patient Instructions (Signed)

## 2015-03-10 NOTE — Progress Notes (Signed)
Subjective:    Patient ID: Roberto Miller, male    DOB: 1932-03-14, 81 y.o.   MRN: [id number]   Patient here today for follow up of chronic medical problems Nd hospital follow up:  * Was in hospital with thrompocytopenia. Was in hospital for 2 weeks. He was having no bleeding while in hospital. He is much better now. NO c/o bleeding or pain anywhere.   Hypertension This is a chronic problem. The current episode started more than 1 year ago. The problem is controlled. Risk factors for coronary artery disease include diabetes mellitus and male gender. Past treatments include ACE inhibitors. The current treatment provides moderate improvement. Compliance problems include diet and exercise.   Diabetes He presents for his follow-up diabetic visit. He has type 2 diabetes mellitus. No MedicAlert identification noted. His disease course has been stable. There are no hypoglycemic associated symptoms. There are no diabetic associated symptoms. There are no diabetic complications. Risk factors for coronary artery disease include diabetes mellitus, hypertension and male sex. Current diabetic treatment includes oral agent (monotherapy). He is compliant with treatment most of the time. When asked about meal planning, he reported none. He has not had a previous visit with a dietitian. He rarely participates in exercise. His breakfast blood glucose is taken between 9-10 am. His breakfast blood glucose range is generally 110-130 mg/dl. His overall blood glucose range is 110-130 mg/dl. An ACE inhibitor/angiotensin II receptor blocker is being taken. He does not see a podiatrist.Eye exam is not current.  Hx anemia On ferrus sulfate daily  Review of Systems  Constitutional: Negative.   HENT: Negative.   Respiratory: Negative.   Cardiovascular: Negative.   Gastrointestinal: Negative.   Genitourinary: Negative.   Neurological: Negative.   Psychiatric/Behavioral: Negative.   All other systems reviewed and are  negative.      Objective:   Physical Exam  Constitutional: He is oriented to person, place, and time. He appears well-developed and well-nourished.  HENT:  Head: Normocephalic.  Right Ear: External ear normal.  Left Ear: External ear normal.  Nose: Nose normal.  Mouth/Throat: Oropharynx is clear and moist.  Eyes: EOM are normal. Pupils are equal, round, and reactive to light.  Neck: Normal range of motion. Neck supple. No JVD present. No thyromegaly present.  Cardiovascular: Normal rate, regular rhythm, normal heart sounds and intact distal pulses.  Exam reveals no gallop and no friction rub.   No murmur heard. Pulmonary/Chest: Effort normal and breath sounds normal. No respiratory distress. He has no wheezes. He has no rales. He exhibits no tenderness.  Abdominal: Soft. Bowel sounds are normal. He exhibits distension. He exhibits no mass. There is tenderness.  Genitourinary: Prostate normal and penis normal.  Musculoskeletal: Normal range of motion. He exhibits no edema.  Lymphadenopathy:    He has no cervical adenopathy.  Neurological: He is alert and oriented to person, place, and time. No cranial nerve deficit.  Skin: Skin is warm and dry.  Psychiatric: He has a normal mood and affect. His behavior is normal. Judgment and thought content normal.    BP 110/64 mmHg  Pulse 85  Temp(Src) 98.9 F (37.2 C) (Oral)  Ht 5' (1.524 m)  Wt 150 lb (68.04 kg)  BMI 29.30 kg/m2     Results for orders placed or performed in visit on 03/10/15  POCT glycosylated hemoglobin (Hb A1C)  Result Value Ref Range   Hemoglobin A1C 7.6         Assessment & Plan:  1. Essential  hypertension Do not add slat o diet - CMP14+EGFR - Metoprolol Tartrate 75 MG TABS; Take 75 mg by mouth 2 (two) times daily.  Dispense: 60 tablet; Refill: 5 - lisinopril (PRINIVIL,ZESTRIL) 5 MG tablet; Take 2 tablets (10 mg total) by mouth daily.  Dispense: 60 tablet; Refill: 1  2. Type 2 diabetes mellitus without  complication, without long-term current use of insulin (HCC) Watch carbs in diet - POCT glycosylated hemoglobin (Hb A1C) - Lipid panel - Microalbumin / creatinine urine ratio - sitaGLIPtin-metformin (JANUMET) 50-1000 MG tablet; Take 1 tablet by mouth 2 (two) times daily with a meal.  Dispense: 60 tablet; Refill: 5 - glipiZIDE (GLUCOTROL) 10 MG tablet; Take 1 tablet (10 mg total) by mouth 2 (two) times daily.  Dispense: 60 tablet; Refill: 5  3. BMI 30.0-30.9,adult Discussed diet and exercise for person with BMI >25 Will recheck weight in 3-6 months  4. Anemia, unspecified anemia type - ferrous sulfate 325 (65 FE) MG tablet; Take 1 tablet (325 mg total) by mouth daily with breakfast.  Dispense: 30 tablet; Refill: 5    Labs pending Health maintenance reviewed Diet and exercise encouraged Continue all meds Follow up  In 3 months   [place], FNP

## 2015-03-11 ENCOUNTER — Telehealth: Payer: Self-pay | Admitting: Nurse Practitioner

## 2015-03-11 ENCOUNTER — Encounter: Payer: Self-pay | Admitting: *Deleted

## 2015-03-11 LAB — CMP14+EGFR
ALT: 47 IU/L — ABNORMAL HIGH (ref 0–44)
AST: 61 IU/L — ABNORMAL HIGH (ref 0–40)
Albumin/Globulin Ratio: 1 — ABNORMAL LOW (ref 1.1–2.5)
Albumin: 3 g/dL — ABNORMAL LOW (ref 3.5–4.7)
Alkaline Phosphatase: 182 IU/L — ABNORMAL HIGH (ref 39–117)
BUN/Creatinine Ratio: 14 (ref 10–22)
BUN: 10 mg/dL (ref 8–27)
Bilirubin Total: 1 mg/dL (ref 0.0–1.2)
CO2: 22 mmol/L (ref 18–29)
Calcium: 8.3 mg/dL — ABNORMAL LOW (ref 8.6–10.2)
Chloride: 94 mmol/L — ABNORMAL LOW (ref 96–106)
Creatinine, Ser: 0.73 mg/dL — ABNORMAL LOW (ref 0.76–1.27)
GFR calc Af Amer: 101 mL/min/{1.73_m2} (ref >59)
GFR calc non Af Amer: 87 mL/min/{1.73_m2} (ref >59)
Globulin, Total: 3.1 g/dL (ref 1.5–4.5)
Glucose: 256 mg/dL — ABNORMAL HIGH (ref 65–99)
Potassium: 4.5 mmol/L (ref 3.5–5.2)
Sodium: 130 mmol/L — ABNORMAL LOW (ref 134–144)
Total Protein: 6.1 g/dL (ref 6.0–8.5)

## 2015-03-11 LAB — ANEMIA PROFILE B
Basophils Absolute: 0 10*3/uL (ref 0.0–0.2)
Basos: 0 %
EOS (ABSOLUTE): 0 10*3/uL (ref 0.0–0.4)
Eos: 0 %
Ferritin: 435 ng/mL — ABNORMAL HIGH (ref 30–400)
Folate: >20 ng/mL (ref >3.0)
Hematocrit: 26.8 % — ABNORMAL LOW (ref 37.5–51.0)
Hemoglobin: 9 g/dL — ABNORMAL LOW (ref 12.6–17.7)
Immature Grans (Abs): 0 10*3/uL (ref 0.0–0.1)
Immature Granulocytes: 0 %
Iron Saturation: 7 % — CL (ref 15–55)
Iron: 13 ug/dL — ABNORMAL LOW (ref 38–169)
Lymphocytes Absolute: 0.5 10*3/uL — ABNORMAL LOW (ref 0.7–3.1)
Lymphs: 7 %
MCH: 29.1 pg (ref 26.6–33.0)
MCHC: 33.6 g/dL (ref 31.5–35.7)
MCV: 87 fL (ref 79–97)
Monocytes Absolute: 0.9 10*3/uL (ref 0.1–0.9)
Monocytes: 14 %
Neutrophils Absolute: 5.2 10*3/uL (ref 1.4–7.0)
Neutrophils: 79 %
Platelets: 126 10*3/uL — ABNORMAL LOW (ref 150–379)
RBC: 3.09 x10E6/uL — ABNORMAL LOW (ref 4.14–5.80)
RDW: 13 % (ref 12.3–15.4)
Retic Ct Pct: 0.8 % (ref 0.6–2.6)
Total Iron Binding Capacity: 189 ug/dL — ABNORMAL LOW (ref 250–450)
UIBC: 176 ug/dL (ref 111–343)
Vitamin B-12: 867 pg/mL (ref 211–946)
WBC: 6.6 10*3/uL (ref 3.4–10.8)

## 2015-03-11 LAB — LIPID PANEL
Chol/HDL Ratio: 3.2 ratio units (ref 0.0–5.0)
Cholesterol, Total: 99 mg/dL — ABNORMAL LOW (ref 100–199)
HDL: 31 mg/dL — ABNORMAL LOW (ref >39)
LDL Calculated: 50 mg/dL (ref 0–99)
Triglycerides: 89 mg/dL (ref 0–149)
VLDL Cholesterol Cal: 18 mg/dL (ref 5–40)

## 2015-03-11 LAB — MICROALBUMIN / CREATININE URINE RATIO
Creatinine, Urine: 163.2 mg/dL
MICROALB/CREAT RATIO: 405.3 mg/g creat — ABNORMAL HIGH (ref 0.0–30.0)
Microalbumin, Urine: 661.4 ug/mL

## 2015-03-12 NOTE — Telephone Encounter (Signed)
Patient wants to know if there is anything you can give him to increase his appetite. Patient is aware that you are out of the office until tomorrow.

## 2015-03-14 NOTE — Telephone Encounter (Signed)
Spoke to Roberto Miller and advised her of provider feedback.

## 2015-03-14 NOTE — Telephone Encounter (Signed)
glucerna shakes 2 x a day

## 2015-03-18 DIAGNOSIS — K838 Other specified diseases of biliary tract: Secondary | ICD-10-CM | POA: Diagnosis not present

## 2015-03-18 DIAGNOSIS — K859 Acute pancreatitis without necrosis or infection, unspecified: Secondary | ICD-10-CM | POA: Insufficient documentation

## 2015-03-31 ENCOUNTER — Other Ambulatory Visit: Payer: Self-pay | Admitting: Nurse Practitioner

## 2015-03-31 DIAGNOSIS — D649 Anemia, unspecified: Secondary | ICD-10-CM

## 2015-03-31 DIAGNOSIS — E119 Type 2 diabetes mellitus without complications: Secondary | ICD-10-CM

## 2015-03-31 DIAGNOSIS — I10 Essential (primary) hypertension: Secondary | ICD-10-CM

## 2015-03-31 MED ORDER — FERROUS SULFATE 325 (65 FE) MG PO TABS: 325.0000 mg | ORAL_TABLET | Freq: Every day | ORAL | Status: DC

## 2015-03-31 MED ORDER — GLIPIZIDE 10 MG PO TABS: 10.0000 mg | ORAL_TABLET | Freq: Two times a day (BID) | ORAL | Status: DC

## 2015-03-31 MED ORDER — METOPROLOL TARTRATE 75 MG PO TABS: 75.0000 mg | ORAL_TABLET | Freq: Two times a day (BID) | ORAL | Status: DC

## 2015-03-31 NOTE — Telephone Encounter (Signed)
done

## 2015-04-02 ENCOUNTER — Telehealth: Payer: Self-pay | Admitting: Nurse Practitioner

## 2015-04-03 NOTE — Telephone Encounter (Signed)
Pt aware that the folic acid is the same as the ferrous sulfate that he is already on & to continue with just that medication.

## 2015-04-03 NOTE — Telephone Encounter (Signed)
He is already on ferrous sulfate which will do the samething as folic acid

## 2015-04-18 ENCOUNTER — Other Ambulatory Visit: Payer: Self-pay | Admitting: Nurse Practitioner

## 2015-04-18 NOTE — Telephone Encounter (Signed)
Quantity?

## 2015-04-21 ENCOUNTER — Telehealth: Payer: Self-pay | Admitting: Nurse Practitioner

## 2015-04-21 NOTE — Telephone Encounter (Signed)
Patients daughter rx has been sent to pharmacy

## 2015-05-07 ENCOUNTER — Telehealth: Payer: Self-pay | Admitting: Nurse Practitioner

## 2015-05-07 NOTE — Telephone Encounter (Signed)
Daughter states that oxycodone is causing nausea and it was prescribed at [facility]. Daughter aware that we will have to see him before we change the medication.

## 2015-05-12 ENCOUNTER — Encounter (INDEPENDENT_AMBULATORY_CARE_PROVIDER_SITE_OTHER): Payer: Medicare Other | Admitting: Ophthalmology

## 2015-05-12 DIAGNOSIS — I10 Essential (primary) hypertension: Secondary | ICD-10-CM | POA: Diagnosis not present

## 2015-05-12 DIAGNOSIS — H43813 Vitreous degeneration, bilateral: Secondary | ICD-10-CM

## 2015-05-12 DIAGNOSIS — H353131 Nonexudative age-related macular degeneration, bilateral, early dry stage: Secondary | ICD-10-CM

## 2015-05-12 DIAGNOSIS — H35033 Hypertensive retinopathy, bilateral: Secondary | ICD-10-CM | POA: Diagnosis not present

## 2015-06-18 ENCOUNTER — Ambulatory Visit (INDEPENDENT_AMBULATORY_CARE_PROVIDER_SITE_OTHER): Payer: Medicare Other | Admitting: Nurse Practitioner

## 2015-06-18 ENCOUNTER — Encounter: Payer: Self-pay | Admitting: Nurse Practitioner

## 2015-06-18 VITALS — BP 108/64 | HR 59 | Temp 97.2°F | Ht 60.0 in | Wt 150.2 lb

## 2015-06-18 DIAGNOSIS — E785 Hyperlipidemia, unspecified: Secondary | ICD-10-CM | POA: Diagnosis not present

## 2015-06-18 DIAGNOSIS — Z6830 Body mass index (BMI) 30.0-30.9, adult: Secondary | ICD-10-CM | POA: Diagnosis not present

## 2015-06-18 DIAGNOSIS — E119 Type 2 diabetes mellitus without complications: Secondary | ICD-10-CM

## 2015-06-18 DIAGNOSIS — D693 Immune thrombocytopenic purpura: Secondary | ICD-10-CM | POA: Diagnosis not present

## 2015-06-18 DIAGNOSIS — I10 Essential (primary) hypertension: Secondary | ICD-10-CM | POA: Diagnosis not present

## 2015-06-18 LAB — BAYER DCA HB A1C WAIVED: HB A1C (BAYER DCA - WAIVED): 6.6 % (ref <7.0)

## 2015-06-18 MED ORDER — SITAGLIPTIN PHOS-METFORMIN HCL 50-1000 MG PO TABS: 1.0000 | ORAL_TABLET | Freq: Two times a day (BID) | ORAL | Status: DC

## 2015-06-18 MED ORDER — GLUCOSE BLOOD VI STRP: ORAL_STRIP | Status: DC

## 2015-06-18 MED ORDER — LISINOPRIL 5 MG PO TABS
10.0000 mg | ORAL_TABLET | Freq: Every day | ORAL | Status: DC
Start: 2015-06-18 — End: 2015-09-08

## 2015-06-18 MED ORDER — GLIPIZIDE 10 MG PO TABS: 10.0000 mg | ORAL_TABLET | Freq: Two times a day (BID) | ORAL | Status: DC

## 2015-06-18 MED ORDER — ONETOUCH DELICA LANCETS 33G MISC: Status: DC

## 2015-06-18 MED ORDER — METOPROLOL TARTRATE 75 MG PO TABS: 75.0000 mg | ORAL_TABLET | Freq: Two times a day (BID) | ORAL | Status: DC

## 2015-06-18 NOTE — Progress Notes (Addendum)
Subjective:    Patient ID: Roberto Miller, male    DOB: 11-12-1931, 82 y.o.   MRN: [id number]   Patient here today for follow up of chronic medical problems.  Outpatient Encounter Prescriptions as of 06/18/2015  Medication Sig  . clotrimazole-betamethasone (LOTRISONE) cream Apply 1 application topically 2 (two) times daily.  . ferrous sulfate 325 (65 FE) MG tablet Take 1 tablet (325 mg total) by mouth daily with breakfast.  . folic acid (FOLVITE) 1 MG tablet TAKE ONE (1) TABLET EACH DAY  . folic acid (FOLVITE) 1 MG tablet Take 1 mg by mouth.  Marland Kitchen glipiZIDE (GLUCOTROL) 10 MG tablet Take 1 tablet (10 mg total) by mouth 2 (two) times daily.  Marland Kitchen lisinopril (PRINIVIL,ZESTRIL) 5 MG tablet Take 2 tablets (10 mg total) by mouth daily.  Marland Kitchen senna (SENOKOT) 8.6 MG tablet Take 1 tablet by mouth daily.  . sitaGLIPtin-metformin (JANUMET) 50-1000 MG tablet Take 1 tablet by mouth 2 (two) times daily with a meal.  . Metoprolol Tartrate 75 MG TABS Take 75 mg by mouth 2 (two) times daily.          Hypertension This is a chronic problem. The current episode started more than 1 year ago. The problem is controlled. Risk factors for coronary artery disease include diabetes mellitus and male gender. Past treatments include ACE inhibitors. The current treatment provides moderate improvement. Compliance problems include diet and exercise.   Diabetes He presents for his follow-up diabetic visit. He has type 2 diabetes mellitus. No MedicAlert identification noted. His disease course has been stable. There are no hypoglycemic associated symptoms. There are no diabetic associated symptoms. There are no diabetic complications. Risk factors for coronary artery disease include diabetes mellitus, hypertension and male sex. Current diabetic treatment includes oral agent (monotherapy). He is compliant with treatment most of the time. When asked about meal planning, he reported none. He has not had a previous visit with a  dietitian. He rarely participates in exercise. His breakfast blood glucose is taken between 9-10 am. His breakfast blood glucose range is generally 110-130 mg/dl. His overall blood glucose range is 110-130 mg/dl. An ACE inhibitor/angiotensin II receptor blocker is being taken. He does not see a podiatrist.Eye exam is not current.  Hx anemia On ferrus sulfate daily ITP Patient had episode of this just prior to last visit and was in hospital for 2 weeks- he is still being followed by hematologist. It is strange but his wife had same episode just after hsi that she is still being treated for.     Review of Systems  Constitutional: Negative.   HENT: Negative.   Respiratory: Negative.   Cardiovascular: Negative.   Gastrointestinal: Negative.   Genitourinary: Negative.   Neurological: Negative.   Psychiatric/Behavioral: Negative.   All other systems reviewed and are negative.      Objective:   Physical Exam  Constitutional: He is oriented to person, place, and time. He appears well-developed and well-nourished.  HENT:  Head: Normocephalic.  Right Ear: External ear normal.  Left Ear: External ear normal.  Nose: Nose normal.  Mouth/Throat: Oropharynx is clear and moist.  Eyes: EOM are normal. Pupils are equal, round, and reactive to light.  Neck: Normal range of motion. Neck supple. No JVD present. No thyromegaly present.  Cardiovascular: Normal rate, regular rhythm, normal heart sounds and intact distal pulses.  Exam reveals no gallop and no friction rub.   No murmur heard. Pulmonary/Chest: Effort normal and breath sounds normal. No respiratory distress. He  has no wheezes. He has no rales. He exhibits no tenderness.  Abdominal: Soft. Bowel sounds are normal. He exhibits distension. He exhibits no mass. There is tenderness.  Genitourinary: Prostate normal and penis normal.  Musculoskeletal: Normal range of motion. He exhibits no edema.  Lymphadenopathy:    He has no cervical  adenopathy.  Neurological: He is alert and oriented to person, place, and time. No cranial nerve deficit.  Skin: Skin is warm and dry.  Psychiatric: He has a normal mood and affect. His behavior is normal. Judgment and thought content normal.    BP 108/64 mmHg  Pulse 59  Temp(Src) 97.2 F (36.2 C) (Oral)  Ht 5' (1.524 m)  Wt 150 lb 3.2 oz (68.13 kg)  BMI 29.33 kg/m2   hgba1c- 6.6%     Assessment & Plan:  1. Essential hypertension Do not add salt to diet - CMP14+EGFR - lisinopril (PRINIVIL,ZESTRIL) 5 MG tablet; Take 2 tablets (10 mg total) by mouth daily.  Dispense: 60 tablet; Refill: 1 - Metoprolol Tartrate 75 MG TABS; Take 75 mg by mouth 2 (two) times daily.  Dispense: 60 tablet; Refill: 5  2. Type 2 diabetes mellitus without complication, without long-term current use of insulin (HCC) Continue to watch carbs in diet - Bayer DCA Hb A1c Waived - sitaGLIPtin-metformin (JANUMET) 50-1000 MG tablet; Take 1 tablet by mouth 2 (two) times daily with a meal.  Dispense: 60 tablet; Refill: 5 - glipiZIDE (GLUCOTROL) 10 MG tablet; Take 1 tablet (10 mg total) by mouth 2 (two) times daily.  Dispense: 60 tablet; Refill: 5  3. Hyperlipidemia Low fat diet - Lipid panel  4. BMI 30.0-30.9,adult Discussed diet and exercise for person with BMI >25 Will recheck weight in 3-6 months  5. Hx ITP Keep follow up appointment with hematologist  Labs pending Health maintenance reviewed Diet and exercise encouraged Continue all meds Follow up  In 3 month   [place], FNP

## 2015-06-18 NOTE — Addendum Note (Signed)
Addended by: Bennie PieriniMARTIN, MARY-MARGARET on: 06/18/2015 03:25 PM   Modules accepted: Orders, SmartSet

## 2015-06-18 NOTE — Patient Instructions (Signed)
Fall Prevention in the Home  Falls can cause injuries and can affect people from all age groups. There are many simple things that you can do to make your home safe and to help prevent falls. WHAT CAN I DO ON THE OUTSIDE OF MY HOME?  Regularly repair the edges of walkways and driveways and fix any cracks.  Remove high doorway thresholds.  Trim any shrubbery on the main path into your home.  Use bright outdoor lighting.  Clear walkways of debris and clutter, including tools and rocks.  Regularly check that handrails are securely fastened and in good repair. Both sides of any steps should have handrails.  Install guardrails along the edges of any raised decks or porches.  Have leaves, snow, and ice cleared regularly.  Use sand or salt on walkways during winter months.  In the garage, clean up any spills right away, including grease or oil spills. WHAT CAN I DO IN THE BATHROOM?  Use night lights.  Install grab bars by the toilet and in the tub and shower. Do not use towel bars as grab bars.  Use non-skid mats or decals on the floor of the tub or shower.  If you need to sit down while you are in the shower, use a plastic, non-slip stool..  Keep the floor dry. Immediately clean up any water that spills on the floor.  Remove soap buildup in the tub or shower on a regular basis.  Attach bath mats securely with double-sided non-slip rug tape.  Remove throw rugs and other tripping hazards from the floor. WHAT CAN I DO IN THE BEDROOM?  Use night lights.  Make sure that a bedside light is easy to reach.  Do not use oversized bedding that drapes onto the floor.  Have a firm chair that has side arms to use for getting dressed.  Remove throw rugs and other tripping hazards from the floor. WHAT CAN I DO IN THE KITCHEN?   Clean up any spills right away.  Avoid walking on wet floors.  Place frequently used items in easy-to-reach places.  If you need to reach for something  above you, use a sturdy step stool that has a grab bar.  Keep electrical cables out of the way.  Do not use floor polish or wax that makes floors slippery. If you have to use wax, make sure that it is non-skid floor wax.  Remove throw rugs and other tripping hazards from the floor. WHAT CAN I DO IN THE STAIRWAYS?  Do not leave any items on the stairs.  Make sure that there are handrails on both sides of the stairs. Fix handrails that are broken or loose. Make sure that handrails are as long as the stairways.  Check any carpeting to make sure that it is firmly attached to the stairs. Fix any carpet that is loose or worn.  Avoid having throw rugs at the top or bottom of stairways, or secure the rugs with carpet tape to prevent them from moving.  Make sure that you have a light switch at the top of the stairs and the bottom of the stairs. If you do not have them, have them installed. WHAT ARE SOME OTHER FALL PREVENTION TIPS?  Wear closed-toe shoes that fit well and support your feet. Wear shoes that have rubber soles or low heels.  When you use a stepladder, make sure that it is completely opened and that the sides are firmly locked. Have someone hold the ladder while you   are using it. Do not climb a closed stepladder.  Add color or contrast paint or tape to grab bars and handrails in your home. Place contrasting color strips on the first and last steps.  Use mobility aids as needed, such as canes, walkers, scooters, and crutches.  Turn on lights if it is dark. Replace any light bulbs that burn out.  Set up furniture so that there are clear paths. Keep the furniture in the same spot.  Fix any uneven floor surfaces.  Choose a carpet design that does not hide the edge of steps of a stairway.  Be aware of any and all pets.  Review your medicines with your healthcare provider. Some medicines can cause dizziness or changes in blood pressure, which increase your risk of falling. Talk  with your health care provider about other ways that you can decrease your risk of falls. This may include working with a physical therapist or trainer to improve your strength, balance, and endurance.   This information is not intended to replace advice given to you by your health care provider. Make sure you discuss any questions you have with your health care provider.   Document Released: 02/26/2002 Document Revised: 07/23/2014 Document Reviewed: 04/12/2014 Elsevier Interactive Patient Education 2016 [employer]  

## 2015-06-19 LAB — CMP14+EGFR
ALT: 13 IU/L (ref 0–44)
AST: 24 IU/L (ref 0–40)
Albumin/Globulin Ratio: 1.4 (ref 1.2–2.2)
Albumin: 4.2 g/dL (ref 3.5–4.7)
Alkaline Phosphatase: 95 IU/L (ref 39–117)
BUN/Creatinine Ratio: 24 — ABNORMAL HIGH (ref 10–22)
BUN: 18 mg/dL (ref 8–27)
Bilirubin Total: 0.4 mg/dL (ref 0.0–1.2)
CO2: 22 mmol/L (ref 18–29)
Calcium: 9.3 mg/dL (ref 8.6–10.2)
Chloride: 103 mmol/L (ref 96–106)
Creatinine, Ser: 0.74 mg/dL — ABNORMAL LOW (ref 0.76–1.27)
GFR calc Af Amer: 100 mL/min/{1.73_m2} (ref >59)
GFR calc non Af Amer: 87 mL/min/{1.73_m2} (ref >59)
Globulin, Total: 3 g/dL (ref 1.5–4.5)
Glucose: 103 mg/dL — ABNORMAL HIGH (ref 65–99)
Potassium: 4.2 mmol/L (ref 3.5–5.2)
Sodium: 138 mmol/L (ref 134–144)
Total Protein: 7.2 g/dL (ref 6.0–8.5)

## 2015-06-19 LAB — LIPID PANEL
Chol/HDL Ratio: 2.6 ratio units (ref 0.0–5.0)
Cholesterol, Total: 148 mg/dL (ref 100–199)
HDL: 58 mg/dL (ref >39)
LDL Calculated: 76 mg/dL (ref 0–99)
Triglycerides: 69 mg/dL (ref 0–149)
VLDL Cholesterol Cal: 14 mg/dL (ref 5–40)

## 2015-07-22 ENCOUNTER — Other Ambulatory Visit: Payer: Self-pay | Admitting: Nurse Practitioner

## 2015-07-31 DIAGNOSIS — D509 Iron deficiency anemia, unspecified: Secondary | ICD-10-CM | POA: Insufficient documentation

## 2015-07-31 DIAGNOSIS — D696 Thrombocytopenia, unspecified: Secondary | ICD-10-CM | POA: Diagnosis not present

## 2015-09-08 ENCOUNTER — Other Ambulatory Visit: Payer: Self-pay | Admitting: Nurse Practitioner

## 2015-11-03 ENCOUNTER — Ambulatory Visit (INDEPENDENT_AMBULATORY_CARE_PROVIDER_SITE_OTHER): Payer: Medicare Other | Admitting: Pediatrics

## 2015-11-03 ENCOUNTER — Encounter: Payer: Self-pay | Admitting: Pediatrics

## 2015-11-03 VITALS — BP 149/71 | HR 67 | Temp 97.1°F | Ht 60.0 in | Wt 158.4 lb

## 2015-11-03 DIAGNOSIS — S0011XA Contusion of right eyelid and periocular area, initial encounter: Secondary | ICD-10-CM | POA: Diagnosis not present

## 2015-11-03 NOTE — Progress Notes (Signed)
    Subjective:    Patient ID: Roberto Miller, male    DOB: 03/19/1932, 82 y.o.   MRN: [id number]  CC: Fall (Saturday AM)   HPI: Roberto Miller is a 82 y.o. male presenting for Fall (Saturday AM)  Larey SeatFell two days ago Larey SeatFell forward going down steps off a [employer] Fell onto [employer] Caught himself with  No LOC Hit R thigh as well Walks fine since then No headaches Head hurts when he puts pressure on the bruise over eye Denies lightheadedness, dizzy, prior to fall Says it happened because he did not grab onto rail as he usually does   Relevant past medical, surgical, family and social history reviewed. Interim medical history since our last visit reviewed. Allergies and medications reviewed and updated.  History  Smoking Status  . Never Smoker  Smokeless Tobacco  . Never Used    ROS: Per HPI      Objective:    BP (!) 149/71   Pulse 67   Temp 97.1 F (36.2 C) (Oral)   Ht 5' (1.524 m)   Wt 158 lb 6.4 oz (71.8 kg)   BMI 30.94 kg/m   Wt Readings from Last 3 Encounters:  11/03/15 158 lb 6.4 oz (71.8 kg)  06/18/15 150 lb 3.2 oz (68.1 kg)  03/10/15 150 lb (68 kg)     Gen: NAD, alert, cooperative with exam, NCAT EYES: EOMI, no conjunctival injection. Swelling over R eye SKIN: some bruising over eye lid, small superficial abrasion forehead over eye MSK: no tenderness to palpation over zygomatic arch, nose, bones surrounding orbit. Some soft tissue swelling over R eye brow ENT:  OP without erythema LYMPH: no cervical LAD CV: NRRR, normal S1/S2, no murmur, distal pulses 2+ b/l Resp: CTABL, no wheezes, normal WOB Ext: No edema, warm Neuro: Alert and oriented, strength equal b/l UE and LE, coordination grossly normal      Assessment & Plan:  [name] seen today for mechanical fall. No LOC, no dizziness or lightheadedness. Feeling well now other than eye swelling. Vision is normal other than R eye lid swellin gobstructs some of vision. Not on blood thinners, doesn't  take aspirin. No headaches. Discussed symptomatic care, return precautions.  Diagnoses and all orders for this visit:  Black eye of right side As above HTN Recheck next visit DM2 Has upcoming appt for recheck Denies lows On janumet, glipizide, continue  Follow up plan: As needed  Rex Krasarol Vincent, MD [facility] 11/03/2015, 5:30 PM

## 2015-11-03 NOTE — Patient Instructions (Signed)
OK to take ibuprofen 400mg  two times a day for a couple of days to help with the swelling  Continue to take tylenol as needed for pain  Use cold wash clothes over the swollen areas

## 2015-11-11 ENCOUNTER — Encounter: Payer: Self-pay | Admitting: Family Medicine

## 2015-11-11 ENCOUNTER — Ambulatory Visit (INDEPENDENT_AMBULATORY_CARE_PROVIDER_SITE_OTHER): Payer: Medicare Other | Admitting: Family Medicine

## 2015-11-11 VITALS — BP 110/58 | HR 58 | Temp 98.3°F | Ht 60.0 in | Wt 156.8 lb

## 2015-11-11 DIAGNOSIS — L209 Atopic dermatitis, unspecified: Secondary | ICD-10-CM

## 2015-11-11 DIAGNOSIS — E119 Type 2 diabetes mellitus without complications: Secondary | ICD-10-CM

## 2015-11-11 DIAGNOSIS — I10 Essential (primary) hypertension: Secondary | ICD-10-CM | POA: Diagnosis not present

## 2015-11-11 DIAGNOSIS — Z6830 Body mass index (BMI) 30.0-30.9, adult: Secondary | ICD-10-CM

## 2015-11-11 LAB — BAYER DCA HB A1C WAIVED: HB A1C (BAYER DCA - WAIVED): 6.8 % (ref <7.0)

## 2015-11-11 MED ORDER — TRIAMCINOLONE ACETONIDE 0.1 % EX CREA: 1.0000 "application " | TOPICAL_CREAM | Freq: Two times a day (BID) | CUTANEOUS | 0 refills | Status: DC

## 2015-11-11 NOTE — Progress Notes (Signed)
BP (!) 110/58 (BP Location: Left Arm, Patient Position: Sitting, Cuff Size: Large)   Pulse (!) 58   Temp 98.3 F (36.8 C) (Oral)   Ht 5' (1.524 m)   Wt 156 lb 12.8 oz (71.1 kg)   BMI 30.62 kg/m    Subjective:    Patient ID: Roberto Miller, male    DOB: 11-16-1931, 82 y.o.   MRN: [id number]  HPI: Roberto Miller is a 82 y.o. male presenting on 11/11/2015 for Diabetes (followup)   HPI Hypertension Patient is coming in for a hypertension recheck today. He is currently on lisinopril and metoprolol. His blood pressure today is 110/58. Patient denies headaches, blurred vision, chest pains, shortness of breath, or weakness. Denies any side effects from medication and is content with current medication.   Type 2 diabetes recheck Patient is currently on Janumet and glipizide for his diabetes and is coming in for recheck today. He denies any issues with the medication. He says he is taking them quite regularly. They have been trying to make some dietary changes but it has been more difficult. Patient is on an ACE inhibitor. He is not currently on a statin. Patient had his last ophthalmology exam on November 2016 and so is not due until this November for another one. He denies any issues with his feet and his last foot exam was in December 2016.  Rash Patient has rash on both lower legs that look like bites. He does not know if he got into chiggers and thinks it might be related to that. He has a lot of pruritus associated with it. He denies any redness or warmth or drainage  Relevant past medical, surgical, family and social history reviewed and updated as indicated. Interim medical history since our last visit reviewed. Allergies and medications reviewed and updated.  Review of Systems  Constitutional: Negative for fever.  HENT: Negative for ear discharge and ear pain.   Eyes: Negative for discharge and visual disturbance.  Respiratory: Negative for shortness of breath and wheezing.     Cardiovascular: Negative for chest pain and leg swelling.  Gastrointestinal: Negative for abdominal pain, constipation and diarrhea.  Genitourinary: Negative for difficulty urinating.  Musculoskeletal: Negative for back pain and gait problem.  Skin: Positive for rash.  Neurological: Negative for syncope, light-headedness and headaches.  All other systems reviewed and are negative.   Per HPI unless specifically indicated above     Medication List       Accurate as of 11/11/15  2:15 PM. Always use your most recent med list.          ferrous sulfate 325 (65 FE) MG tablet Take 1 tablet (325 mg total) by mouth daily with breakfast.   folic acid 1 MG tablet Commonly known as:  FOLVITE Take 1 mg by mouth.   glipiZIDE 10 MG tablet Commonly known as:  GLUCOTROL Take 1 tablet (10 mg total) by mouth 2 (two) times daily.   glucose blood test strip Commonly known as:  ONETOUCH VERIO Check blood sugar one time er day and prn   Dx E11.9   lisinopril 5 MG tablet Commonly known as:  PRINIVIL,ZESTRIL TAKE TWO TABLETS BY MOUTH DAILY   Metoprolol Tartrate 75 MG Tabs Take 75 mg by mouth daily.   Metoprolol Tartrate 75 MG Tabs Take 75 mg by mouth 2 (two) times daily.   ONETOUCH DELICA LANCETS 12I Misc Check blood sugar 1x per day and prn   Dx E11.9   sitaGLIPtin-metformin 50-1000  MG tablet Commonly known as:  JANUMET Take 1 tablet by mouth 2 (two) times daily with a meal.          Objective:    BP (!) 110/58 (BP Location: Left Arm, Patient Position: Sitting, Cuff Size: Large)   Pulse (!) 58   Temp 98.3 F (36.8 C) (Oral)   Ht 5' (1.524 m)   Wt 156 lb 12.8 oz (71.1 kg)   BMI 30.62 kg/m   Wt Readings from Last 3 Encounters:  11/11/15 156 lb 12.8 oz (71.1 kg)  11/03/15 158 lb 6.4 oz (71.8 kg)  06/18/15 150 lb 3.2 oz (68.1 kg)    Physical Exam  Constitutional: He is oriented to person, place, and time. He appears well-developed and well-nourished. No distress.  Eyes:  Conjunctivae and EOM are normal. Pupils are equal, round, and reactive to light. Right eye exhibits no discharge. No scleral icterus.  Neck: Neck supple. No thyromegaly present.  Cardiovascular: Normal rate, regular rhythm, normal heart sounds and intact distal pulses.   No murmur heard. Pulmonary/Chest: Effort normal and breath sounds normal. No respiratory distress. He has no wheezes.  Musculoskeletal: Normal range of motion. He exhibits no edema.  Lymphadenopathy:    He has no cervical adenopathy.  Neurological: He is alert and oriented to person, place, and time. Coordination normal.  Skin: Skin is warm and dry. Rash noted. Rash is papular (Scattered small papules on lower extremities consistent with atopic dermatitis or chigger bites). He is not diaphoretic.  Psychiatric: He has a normal mood and affect. His behavior is normal.  Nursing note and vitals reviewed.     Assessment & Plan:   Problem List Items Addressed This Visit      Cardiovascular and Mediastinum   Hypertension - Primary   Relevant Medications   Metoprolol Tartrate 75 MG TABS   Other Relevant Orders   CMP14+EGFR (Completed)     Endocrine   Diabetes (HCC)   Relevant Orders   Bayer DCA Hb A1c Waived (Completed)   CMP14+EGFR (Completed)   Lipid panel (Completed)     Other   BMI 30.0-30.9,adult   Relevant Orders   Lipid panel (Completed)    Other Visit Diagnoses    Morbid obesity, unspecified obesity type (HCC)       Relevant Orders   TSH (Completed)   Atopic dermatitis       Pruritic spots on his bilateral lower legs, could be atopic dermatitis versus chigger bites, will send triamcinolone   Relevant Medications   triamcinolone cream (KENALOG) 0.1 %       Follow up plan: Return if symptoms worsen or fail to improve.  Counseling provided for all of the vaccine components Orders Placed This Encounter  Procedures  . Bayer DCA Hb A1c Waived  . CMP14+EGFR  . Lipid panel  . TSH    Joshua  [name], MD [facility] 11/11/2015, 2:15 PM     

## 2015-11-12 LAB — CMP14+EGFR
ALT: 11 IU/L (ref 0–44)
AST: 17 IU/L (ref 0–40)
Albumin/Globulin Ratio: 1.4 (ref 1.2–2.2)
Albumin: 4.2 g/dL (ref 3.5–4.7)
Alkaline Phosphatase: 75 IU/L (ref 39–117)
BUN/Creatinine Ratio: 31 — ABNORMAL HIGH (ref 10–24)
BUN: 24 mg/dL (ref 8–27)
Bilirubin Total: 0.4 mg/dL (ref 0.0–1.2)
CO2: 21 mmol/L (ref 18–29)
Calcium: 9.1 mg/dL (ref 8.6–10.2)
Chloride: 103 mmol/L (ref 96–106)
Creatinine, Ser: 0.77 mg/dL (ref 0.76–1.27)
GFR calc Af Amer: 98 mL/min/{1.73_m2} (ref >59)
GFR calc non Af Amer: 85 mL/min/{1.73_m2} (ref >59)
Globulin, Total: 2.9 g/dL (ref 1.5–4.5)
Glucose: 213 mg/dL — ABNORMAL HIGH (ref 65–99)
Potassium: 4.3 mmol/L (ref 3.5–5.2)
Sodium: 139 mmol/L (ref 134–144)
Total Protein: 7.1 g/dL (ref 6.0–8.5)

## 2015-11-12 LAB — TSH: TSH: 3.13 u[IU]/mL (ref 0.450–4.500)

## 2015-11-12 LAB — LIPID PANEL
Chol/HDL Ratio: 4.3 ratio units (ref 0.0–5.0)
Cholesterol, Total: 163 mg/dL (ref 100–199)
HDL: 38 mg/dL — ABNORMAL LOW (ref >39)
LDL Calculated: 74 mg/dL (ref 0–99)
Triglycerides: 255 mg/dL — ABNORMAL HIGH (ref 0–149)
VLDL Cholesterol Cal: 51 mg/dL — ABNORMAL HIGH (ref 5–40)

## 2015-11-25 DIAGNOSIS — H11001 Unspecified pterygium of right eye: Secondary | ICD-10-CM | POA: Diagnosis not present

## 2015-11-25 DIAGNOSIS — Z7984 Long term (current) use of oral hypoglycemic drugs: Secondary | ICD-10-CM | POA: Diagnosis not present

## 2015-11-25 DIAGNOSIS — H04123 Dry eye syndrome of bilateral lacrimal glands: Secondary | ICD-10-CM | POA: Diagnosis not present

## 2015-11-25 DIAGNOSIS — E119 Type 2 diabetes mellitus without complications: Secondary | ICD-10-CM | POA: Diagnosis not present

## 2015-11-25 LAB — HM DIABETES EYE EXAM

## 2015-12-19 ENCOUNTER — Other Ambulatory Visit: Payer: Self-pay | Admitting: Nurse Practitioner

## 2016-01-22 ENCOUNTER — Ambulatory Visit (INDEPENDENT_AMBULATORY_CARE_PROVIDER_SITE_OTHER): Payer: Medicare Other

## 2016-01-22 DIAGNOSIS — Z23 Encounter for immunization: Secondary | ICD-10-CM

## 2016-01-29 DIAGNOSIS — A4151 Sepsis due to Escherichia coli [E. coli]: Secondary | ICD-10-CM | POA: Diagnosis not present

## 2016-01-29 DIAGNOSIS — D72829 Elevated white blood cell count, unspecified: Secondary | ICD-10-CM | POA: Diagnosis not present

## 2016-01-29 DIAGNOSIS — D696 Thrombocytopenia, unspecified: Secondary | ICD-10-CM | POA: Diagnosis not present

## 2016-01-29 DIAGNOSIS — E119 Type 2 diabetes mellitus without complications: Secondary | ICD-10-CM | POA: Diagnosis not present

## 2016-01-29 DIAGNOSIS — E611 Iron deficiency: Secondary | ICD-10-CM | POA: Diagnosis not present

## 2016-01-29 DIAGNOSIS — D649 Anemia, unspecified: Secondary | ICD-10-CM | POA: Diagnosis not present

## 2016-02-02 ENCOUNTER — Other Ambulatory Visit: Payer: Self-pay | Admitting: Nurse Practitioner

## 2016-02-03 ENCOUNTER — Telehealth: Payer: Self-pay | Admitting: Family Medicine

## 2016-02-11 ENCOUNTER — Encounter: Payer: Self-pay | Admitting: Family Medicine

## 2016-02-11 ENCOUNTER — Ambulatory Visit (INDEPENDENT_AMBULATORY_CARE_PROVIDER_SITE_OTHER): Payer: Medicare Other | Admitting: Family Medicine

## 2016-02-11 VITALS — BP 117/65 | HR 61 | Temp 97.3°F | Ht 61.52 in | Wt 155.8 lb

## 2016-02-11 DIAGNOSIS — L2089 Other atopic dermatitis: Secondary | ICD-10-CM

## 2016-02-11 DIAGNOSIS — E119 Type 2 diabetes mellitus without complications: Secondary | ICD-10-CM | POA: Diagnosis not present

## 2016-02-11 DIAGNOSIS — I10 Essential (primary) hypertension: Secondary | ICD-10-CM

## 2016-02-11 LAB — BAYER DCA HB A1C WAIVED: HB A1C (BAYER DCA - WAIVED): 6.9 % (ref <7.0)

## 2016-02-11 MED ORDER — TRIAMCINOLONE ACETONIDE 0.1 % EX CREA: 1.0000 "application " | TOPICAL_CREAM | Freq: Two times a day (BID) | CUTANEOUS | 1 refills | Status: DC

## 2016-02-11 NOTE — Progress Notes (Signed)
BP 117/65   Pulse 61   Temp 97.3 F (36.3 C) (Oral)   Ht 5' 1.52" (1.563 m)   Wt 155 lb 12.8 oz (70.7 kg)   BMI 28.94 kg/m    Subjective:    Patient ID: Roberto Miller, male    DOB: 05/20/1931, 82 y.o.   MRN: [id number]  HPI: Roberto Sheffieldrinidad Bordonaro is a 82 y.o. male presenting on 02/11/2016 for Knee Pain (3 month follow up) and Hypertension   HPI Hypertension recheck Patient is coming in for hypertension recheck today. He is currently on lisinopril and metoprolol. His blood pressure today is 117/65. Patient denies headaches, blurred vision, chest pains, shortness of breath, or weakness. Denies any side effects from medication and is content with current medication.   Type 2 diabetes recheck Refuses Coumadin for recheck of the diabetes as well. His A1c has been trending up recently and was 6.8 on the last time 3 months ago. He says that his blood sugars are running between 120s 150 early in the morning. He denies any hypoglycemic episodes. He is currently on glipizide and Janumet. He denies any issues with medications. He is on an ACE inhibitor for renal protection and he did see an ophthalmologist in October of this year. He denies any issues with his feet currently.  Knee pain recheck, patient's knee pain has completely resolved and is doing very well.  Rash Patient has had an intermittent dermatitis rash that he does not have currently. It is usually on his arms or his legs and he uses triamcinolone for the last time and it worked great but he is out of it now and needs a refill.  Relevant past medical, surgical, family and social history reviewed and updated as indicated. Interim medical history since our last visit reviewed. Allergies and medications reviewed and updated.  Review of Systems  Constitutional: Negative for chills and fever.  HENT: Negative for ear pain and tinnitus.   Eyes: Negative for pain and discharge.  Respiratory: Negative for cough, shortness of breath and  wheezing.   Cardiovascular: Negative for chest pain, palpitations and leg swelling.  Gastrointestinal: Negative for abdominal pain, blood in stool, constipation and diarrhea.  Genitourinary: Negative for dysuria and hematuria.  Musculoskeletal: Negative for back pain, gait problem and myalgias.  Skin: Positive for rash.  Neurological: Negative for dizziness, weakness and headaches.  Psychiatric/Behavioral: Negative for suicidal ideas.  All other systems reviewed and are negative.   Per HPI unless specifically indicated above     Medication List       Accurate as of 02/11/16  3:56 PM. Always use your most recent med list.          ferrous sulfate 325 (65 FE) MG tablet Take 1 tablet (325 mg total) by mouth daily with breakfast.   folic acid 1 MG tablet Commonly known as:  FOLVITE Take 1 mg by mouth.   folic acid 1 MG tablet Commonly known as:  FOLVITE TAKE ONE (1) TABLET EACH DAY   glipiZIDE 10 MG tablet Commonly known as:  GLUCOTROL Take 1 tablet (10 mg total) by mouth 2 (two) times daily.   glucose blood test strip Commonly known as:  ONETOUCH VERIO Check blood sugar one time er day and prn   Dx E11.9   lisinopril 5 MG tablet Commonly known as:  PRINIVIL,ZESTRIL TAKE TWO TABLETS BY MOUTH DAILY   Metoprolol Tartrate 75 MG Tabs Take 75 mg by mouth daily.   Metoprolol Tartrate 75 MG Tabs Take  75 mg by mouth 2 (two) times daily.   ONETOUCH DELICA LANCETS 33G Misc Check blood sugar 1x per day and prn   Dx E11.9   sitaGLIPtin-metformin 50-1000 MG tablet Commonly known as:  JANUMET Take 1 tablet by mouth 2 (two) times daily with a meal.   triamcinolone cream 0.1 % Commonly known as:  KENALOG Apply 1 application topically 2 (two) times daily.          Objective:    BP 117/65   Pulse 61   Temp 97.3 F (36.3 C) (Oral)   Ht 5' 1.52" (1.563 m)   Wt 155 lb 12.8 oz (70.7 kg)   BMI 28.94 kg/m   Wt Readings from Last 3 Encounters:  02/11/16 155 lb 12.8 oz  (70.7 kg)  11/11/15 156 lb 12.8 oz (71.1 kg)  11/03/15 158 lb 6.4 oz (71.8 kg)    Physical Exam  Constitutional: He is oriented to person, place, and time. He appears well-developed and well-nourished. No distress.  Eyes: Conjunctivae are normal. Right eye exhibits no discharge. Left eye exhibits no discharge. No scleral icterus.  Cardiovascular: Normal rate, regular rhythm, normal heart sounds and intact distal pulses.   No murmur heard. Pulmonary/Chest: Effort normal and breath sounds normal. No respiratory distress. He has no wheezes. He has no rales.  Musculoskeletal: Normal range of motion. He exhibits no edema.  Neurological: He is alert and oriented to person, place, and time. Coordination normal.  Skin: Skin is warm and dry. No rash (Patient does not have any active rash) noted. He is not diaphoretic.  Psychiatric: He has a normal mood and affect. His behavior is normal.  Nursing note and vitals reviewed.   Results for orders placed or performed in visit on 01/15/16  HM DIABETES EYE EXAM  Result Value Ref Range   HM Diabetic Eye Exam No Retinopathy No Retinopathy      Assessment & Plan:   Problem List Items Addressed This Visit      Cardiovascular and Mediastinum   Hypertension - Primary     Endocrine   Diabetes (HCC)   Relevant Orders   Bayer DCA Hb A1c Waived (Completed)    Other Visit Diagnoses    Other atopic dermatitis       Relevant Medications   triamcinolone cream (KENALOG) 0.1 %       Follow up plan: Return in about 3 months (around 05/13/2016), or if symptoms worsen or fail to improve.  Counseling provided for all of the vaccine components Orders Placed This Encounter  Procedures  . [facility] Hb A1c Waived    Arville CareJoshua Neev Mcmains, MD [facility] 02/11/2016, 3:56 PM

## 2016-03-03 ENCOUNTER — Other Ambulatory Visit: Payer: Self-pay | Admitting: Nurse Practitioner

## 2016-03-03 DIAGNOSIS — D649 Anemia, unspecified: Secondary | ICD-10-CM

## 2016-03-24 ENCOUNTER — Other Ambulatory Visit: Payer: Self-pay | Admitting: Nurse Practitioner

## 2016-04-06 ENCOUNTER — Other Ambulatory Visit: Payer: Self-pay | Admitting: Nurse Practitioner

## 2016-04-06 DIAGNOSIS — E119 Type 2 diabetes mellitus without complications: Secondary | ICD-10-CM

## 2016-04-12 ENCOUNTER — Other Ambulatory Visit: Payer: Self-pay | Admitting: Nurse Practitioner

## 2016-04-12 DIAGNOSIS — E119 Type 2 diabetes mellitus without complications: Secondary | ICD-10-CM

## 2016-05-17 ENCOUNTER — Ambulatory Visit (INDEPENDENT_AMBULATORY_CARE_PROVIDER_SITE_OTHER): Payer: Medicare Other | Admitting: Family Medicine

## 2016-05-17 ENCOUNTER — Encounter: Payer: Self-pay | Admitting: Family Medicine

## 2016-05-17 ENCOUNTER — Other Ambulatory Visit: Payer: Self-pay | Admitting: Nurse Practitioner

## 2016-05-17 VITALS — BP 123/66 | HR 55 | Temp 97.8°F | Ht 61.5 in | Wt 157.5 lb

## 2016-05-17 DIAGNOSIS — E119 Type 2 diabetes mellitus without complications: Secondary | ICD-10-CM | POA: Diagnosis not present

## 2016-05-17 DIAGNOSIS — I10 Essential (primary) hypertension: Secondary | ICD-10-CM

## 2016-05-17 DIAGNOSIS — B351 Tinea unguium: Secondary | ICD-10-CM | POA: Diagnosis not present

## 2016-05-17 DIAGNOSIS — D649 Anemia, unspecified: Secondary | ICD-10-CM | POA: Diagnosis not present

## 2016-05-17 LAB — BAYER DCA HB A1C WAIVED: HB A1C (BAYER DCA - WAIVED): 7.1 % — ABNORMAL HIGH (ref <7.0)

## 2016-05-17 MED ORDER — GLIPIZIDE 10 MG PO TABS: 10.0000 mg | ORAL_TABLET | Freq: Two times a day (BID) | ORAL | 3 refills | Status: DC

## 2016-05-17 MED ORDER — TERBINAFINE HCL 250 MG PO TABS: 250.0000 mg | ORAL_TABLET | Freq: Every day | ORAL | 0 refills | Status: DC

## 2016-05-17 NOTE — Progress Notes (Signed)
 BP 123/66   Pulse (!) 55   Temp 97.8 F (36.6 C) (Oral)   Ht 5' 1.5" (1.562 m)   Wt 157 lb 8 oz (71.4 kg)   BMI 29.28 kg/m    Subjective:    Patient ID: Roberto Miller, male    DOB: 10/08/1931, 83 y.o.   MRN: [id number]  HPI: Roberto Miller is a 83 y.o. male presenting on 05/17/2016 for Diabetes (3 month followup) and Hypertension   HPI Hypertension recheck Patient is coming in today for hypertension recheck. He is currently on lisinopril and Lopressor. Patient denies headaches, blurred vision, chest pains, shortness of breath, or weakness. Denies any side effects from medication and is content with current medication.   Type 2 diabetes Patient is coming in for recheck of type 2 diabetes. He is currently Janumet and glipizide. He has not yet seen an ophthalmologist this year. He denies any issues with his vision or with his feet that are new except he has some toenail fungus on all of his toenails on both sides and thickened yellow toenails. He denies any sores or ulceration or decreased sensation.  Anemia recheck Patient is coming in for an anemia recheck. He denies any chest pain or lightheadedness or dizziness. He denies any bleeding episodes or blood in the stool.  Relevant past medical, surgical, family and social history reviewed and updated as indicated. Interim medical history since our last visit reviewed. Allergies and medications reviewed and updated.  Review of Systems  Constitutional: Negative for chills and fever.  Respiratory: Negative for shortness of breath and wheezing.   Cardiovascular: Negative for chest pain and leg swelling.  Musculoskeletal: Negative for back pain and gait problem.  Skin: Positive for color change (Toenail fungus). Negative for rash.  Neurological: Negative for dizziness, weakness, light-headedness and numbness.  All other systems reviewed and are negative.   Per HPI unless specifically indicated above     Objective:    BP  123/66   Pulse (!) 55   Temp 97.8 F (36.6 C) (Oral)   Ht 5' 1.5" (1.562 m)   Wt 157 lb 8 oz (71.4 kg)   BMI 29.28 kg/m   Wt Readings from Last 3 Encounters:  05/17/16 157 lb 8 oz (71.4 kg)  02/11/16 155 lb 12.8 oz (70.7 kg)  11/11/15 156 lb 12.8 oz (71.1 kg)    Physical Exam  Constitutional: He is oriented to person, place, and time. He appears well-developed and well-nourished. No distress.  Eyes: Conjunctivae are normal. Right eye exhibits no discharge. No scleral icterus.  Neck: Neck supple. No thyromegaly present.  Cardiovascular: Normal rate, regular rhythm, normal heart sounds and intact distal pulses.   No murmur heard. Pulmonary/Chest: Effort normal and breath sounds normal. No respiratory distress. He has no wheezes.  Musculoskeletal: Normal range of motion. He exhibits no edema.  Lymphadenopathy:    He has no cervical adenopathy.  Neurological: He is alert and oriented to person, place, and time. Coordination normal.  Skin: Skin is warm and dry. No rash noted. He is not diaphoretic.  Toenail fungus and thickened yellowed brittle toenails on all of his toenails.  Psychiatric: He has a normal mood and affect. His behavior is normal.  Nursing note and vitals reviewed.   Results for orders placed or performed in visit on 02/11/16  Bayer DCA Hb A1c Waived  Result Value Ref Range   Bayer DCA Hb A1c Waived 6.9 <7.0 %      Assessment & Plan:     Problem List Items Addressed This Visit      Cardiovascular and Mediastinum   Hypertension - Primary   Relevant Orders   CMP14+EGFR (Completed)   Lipid panel (Completed)     Endocrine   Diabetes (HCC)   Relevant Medications   glipiZIDE (GLUCOTROL) 10 MG tablet   Other Relevant Orders   Bayer DCA Hb A1c Waived (Completed)   Lipid panel (Completed)     Other   Absolute anemia   Relevant Orders   CBC with Differential/Platelet (Completed)    Other Visit Diagnoses    Onychomycosis due to dermatophyte       Relevant  Medications   terbinafine (LAMISIL) 250 MG tablet       Follow up plan: Return in about 3 months (around 08/14/2016), or if symptoms worsen or fail to improve, for Diabetes and hypertension.  Counseling provided for all of the vaccine components Orders Placed This Encounter  Procedures  . CMP14+EGFR  . CBC with Differential/Platelet  . Bayer DCA Hb A1c Waived  . Lipid panel    Joshua [name], MD [facility] 05/17/2016, 5:23 PM     

## 2016-05-18 LAB — CMP14+EGFR
ALT: 14 IU/L (ref 0–44)
AST: 16 IU/L (ref 0–40)
Albumin/Globulin Ratio: 1.4 (ref 1.2–2.2)
Albumin: 4.3 g/dL (ref 3.5–4.7)
Alkaline Phosphatase: 85 IU/L (ref 39–117)
BUN/Creatinine Ratio: 23 (ref 10–24)
BUN: 18 mg/dL (ref 8–27)
Bilirubin Total: 0.3 mg/dL (ref 0.0–1.2)
CO2: 23 mmol/L (ref 18–29)
Calcium: 9.4 mg/dL (ref 8.6–10.2)
Chloride: 99 mmol/L (ref 96–106)
Creatinine, Ser: 0.78 mg/dL (ref 0.76–1.27)
GFR calc Af Amer: 97 mL/min/{1.73_m2} (ref >59)
GFR calc non Af Amer: 84 mL/min/{1.73_m2} (ref >59)
Globulin, Total: 3.1 g/dL (ref 1.5–4.5)
Glucose: 134 mg/dL — ABNORMAL HIGH (ref 65–99)
Potassium: 4.8 mmol/L (ref 3.5–5.2)
Sodium: 140 mmol/L (ref 134–144)
Total Protein: 7.4 g/dL (ref 6.0–8.5)

## 2016-05-18 LAB — LIPID PANEL
Chol/HDL Ratio: 3.8 ratio units (ref 0.0–5.0)
Cholesterol, Total: 158 mg/dL (ref 100–199)
HDL: 42 mg/dL (ref >39)
LDL Calculated: 90 mg/dL (ref 0–99)
Triglycerides: 132 mg/dL (ref 0–149)
VLDL Cholesterol Cal: 26 mg/dL (ref 5–40)

## 2016-05-18 LAB — CBC WITH DIFFERENTIAL/PLATELET
Basophils Absolute: 0 10*3/uL (ref 0.0–0.2)
Basos: 0 %
EOS (ABSOLUTE): 0.1 10*3/uL (ref 0.0–0.4)
Eos: 2 %
Hematocrit: 33.6 % — ABNORMAL LOW (ref 37.5–51.0)
Hemoglobin: 11.6 g/dL — ABNORMAL LOW (ref 13.0–17.7)
Immature Grans (Abs): 0 10*3/uL (ref 0.0–0.1)
Immature Granulocytes: 0 %
Lymphocytes Absolute: 1.4 10*3/uL (ref 0.7–3.1)
Lymphs: 31 %
MCH: 29.7 pg (ref 26.6–33.0)
MCHC: 34.5 g/dL (ref 31.5–35.7)
MCV: 86 fL (ref 79–97)
Monocytes Absolute: 0.4 10*3/uL (ref 0.1–0.9)
Monocytes: 8 %
Neutrophils Absolute: 2.7 10*3/uL (ref 1.4–7.0)
Neutrophils: 59 %
Platelets: 175 10*3/uL (ref 150–379)
RBC: 3.9 x10E6/uL — ABNORMAL LOW (ref 4.14–5.80)
RDW: 14.1 % (ref 12.3–15.4)
WBC: 4.6 10*3/uL (ref 3.4–10.8)

## 2016-06-07 ENCOUNTER — Other Ambulatory Visit: Payer: Self-pay | Admitting: Nurse Practitioner

## 2016-06-07 DIAGNOSIS — E119 Type 2 diabetes mellitus without complications: Secondary | ICD-10-CM

## 2016-08-03 ENCOUNTER — Other Ambulatory Visit: Payer: Self-pay | Admitting: Family Medicine

## 2016-08-03 DIAGNOSIS — D649 Anemia, unspecified: Secondary | ICD-10-CM

## 2016-08-09 ENCOUNTER — Ambulatory Visit (INDEPENDENT_AMBULATORY_CARE_PROVIDER_SITE_OTHER): Payer: Medicare Other | Admitting: Family Medicine

## 2016-08-09 ENCOUNTER — Other Ambulatory Visit: Payer: Self-pay | Admitting: Nurse Practitioner

## 2016-08-09 ENCOUNTER — Ambulatory Visit (HOSPITAL_COMMUNITY): Payer: Medicare Other

## 2016-08-09 ENCOUNTER — Encounter: Payer: Self-pay | Admitting: Family Medicine

## 2016-08-09 VITALS — BP 129/65 | HR 78 | Temp 100.9°F | Ht 61.5 in | Wt 157.0 lb

## 2016-08-09 DIAGNOSIS — R1033 Periumbilical pain: Secondary | ICD-10-CM

## 2016-08-09 DIAGNOSIS — K859 Acute pancreatitis without necrosis or infection, unspecified: Secondary | ICD-10-CM

## 2016-08-09 DIAGNOSIS — E119 Type 2 diabetes mellitus without complications: Secondary | ICD-10-CM

## 2016-08-09 NOTE — Progress Notes (Signed)
BP 129/65   Pulse 78   Temp (!) 100.9 F (38.3 C) (Oral)   Ht 5' 1.5" (1.562 m)   Wt 157 lb (71.2 kg)   BMI 29.18 kg/m    Subjective:    Patient ID: Roberto Miller, male    DOB: Dec 22, 1931, 83 y.o.   MRN: [id number]  HPI: Roberto Miller is a 83 y.o. male presenting on 08/09/2016 for Abdominal Pain (two episodes, came on suddenly, mid epigastric region, lasts about 20 minutes; history of pancreatic inflammation )   HPI Epigastric and right upper quadrant Abdominal pain Patient comes in with complaints of epigastric and right upper quadrant abdominal pain that started suddenly yesterday. He had 2 episodes that came on suddenly worse severe and doubled him over that lasted about 20 minutes yesterday. He said he was having some nausea but no vomiting but denies any diarrhea. He does say that he's been having some acid reflux and indigestion. He denies any fevers or chills. Today the pain is improved and he rates it as a 3 out of 10. He denies any radiation of the pain anywhere else. He does have a little fever on exam today but he says he was not feeling it. He says he has a history of pancreatitis and does not know if this would be that. He denies any radiation into his back.  Relevant past medical, surgical, family and social history reviewed and updated as indicated. Interim medical history since our last visit reviewed. Allergies and medications reviewed and updated.  Review of Systems  Constitutional: Negative for chills and fever.  Respiratory: Negative for shortness of breath and wheezing.   Cardiovascular: Negative for chest pain and leg swelling.  Gastrointestinal: Positive for abdominal pain. Negative for constipation, diarrhea, nausea and vomiting.  Genitourinary: Negative for dysuria, flank pain, frequency and urgency.  Musculoskeletal: Negative for back pain and gait problem.  Skin: Negative for rash.  All other systems reviewed and are negative.   Per HPI unless  specifically indicated above     Objective:    BP 129/65   Pulse 78   Temp (!) 100.9 F (38.3 C) (Oral)   Ht 5' 1.5" (1.562 m)   Wt 157 lb (71.2 kg)   BMI 29.18 kg/m   Wt Readings from Last 3 Encounters:  08/09/16 157 lb (71.2 kg)  05/17/16 157 lb 8 oz (71.4 kg)  02/11/16 155 lb 12.8 oz (70.7 kg)    Physical Exam  Constitutional: He is oriented to person, place, and time. He appears well-developed and well-nourished. No distress.  Eyes: Conjunctivae are normal. No scleral icterus.  Cardiovascular: Normal rate, regular rhythm, normal heart sounds and intact distal pulses.   No murmur heard. Pulmonary/Chest: Effort normal and breath sounds normal. No respiratory distress. He has no wheezes. He has no rales.  Abdominal: Soft. Bowel sounds are normal. He exhibits no distension. There is no hepatosplenomegaly. There is tenderness in the right upper quadrant, epigastric area and periumbilical area. There is no rigidity, no rebound, no guarding and no CVA tenderness.  Musculoskeletal: Normal range of motion. He exhibits no edema.  Neurological: He is alert and oriented to person, place, and time. Coordination normal.  Skin: Skin is warm and dry. No rash noted. He is not diaphoretic.  Psychiatric: He has a normal mood and affect. His behavior is normal.  Nursing note and vitals reviewed.       Assessment & Plan:   Problem List Items Addressed This Visit  None    Visit Diagnoses    Periumbilical abdominal pain    -  Primary   Relevant Orders   CT Abdomen Pelvis W Contrast (Completed)   CBC with Differential/Platelet (Completed)   CMP14+EGFR (Completed)   Lipase (Completed)   Acute pancreatitis, unspecified complication status, unspecified pancreatitis type       Relevant Orders   CT Abdomen Pelvis W Contrast (Completed)   CBC with Differential/Platelet (Completed)   CMP14+EGFR (Completed)   Lipase (Completed)     Will await CT abdomen and labs and decide from there.  Ordered CT abdomen stat   Follow up plan: Return if symptoms worsen or fail to improve.  Counseling provided for all of the vaccine components Orders Placed This Encounter  Procedures  . CT Abdomen Pelvis W Contrast    Caryl Pina, MD [facility] 08/09/2016, 3:43 PM

## 2016-08-10 ENCOUNTER — Ambulatory Visit (HOSPITAL_COMMUNITY)
Admission: RE | Admit: 2016-08-10 | Discharge: 2016-08-10 | Disposition: A | Discharge status: Home / Self Care | Payer: Medicare Other | Source: Ambulatory Visit | Attending: Family Medicine | Admitting: Family Medicine

## 2016-08-10 DIAGNOSIS — Z8719 Personal history of other diseases of the digestive system: Secondary | ICD-10-CM | POA: Diagnosis not present

## 2016-08-10 DIAGNOSIS — I517 Cardiomegaly: Secondary | ICD-10-CM | POA: Insufficient documentation

## 2016-08-10 DIAGNOSIS — K5790 Diverticulosis of intestine, part unspecified, without perforation or abscess without bleeding: Secondary | ICD-10-CM | POA: Insufficient documentation

## 2016-08-10 DIAGNOSIS — R1033 Periumbilical pain: Secondary | ICD-10-CM | POA: Diagnosis not present

## 2016-08-10 DIAGNOSIS — K760 Fatty (change of) liver, not elsewhere classified: Secondary | ICD-10-CM | POA: Diagnosis not present

## 2016-08-10 DIAGNOSIS — K859 Acute pancreatitis without necrosis or infection, unspecified: Secondary | ICD-10-CM

## 2016-08-10 DIAGNOSIS — N400 Enlarged prostate without lower urinary tract symptoms: Secondary | ICD-10-CM | POA: Insufficient documentation

## 2016-08-10 DIAGNOSIS — M4316 Spondylolisthesis, lumbar region: Secondary | ICD-10-CM | POA: Diagnosis not present

## 2016-08-10 DIAGNOSIS — M479 Spondylosis, unspecified: Secondary | ICD-10-CM | POA: Diagnosis not present

## 2016-08-10 LAB — LIPASE: Lipase: 113 U/L — ABNORMAL HIGH (ref 13–78)

## 2016-08-10 LAB — CMP14+EGFR
ALT: 426 IU/L — ABNORMAL HIGH (ref 0–44)
AST: 527 IU/L (ref 0–40)
Albumin/Globulin Ratio: 1.5 (ref 1.2–2.2)
Albumin: 4.3 g/dL (ref 3.5–4.7)
Alkaline Phosphatase: 159 IU/L — ABNORMAL HIGH (ref 39–117)
BUN/Creatinine Ratio: 25 — ABNORMAL HIGH (ref 10–24)
BUN: 17 mg/dL (ref 8–27)
Bilirubin Total: 0.6 mg/dL (ref 0.0–1.2)
CO2: 23 mmol/L (ref 18–29)
Calcium: 9.2 mg/dL (ref 8.6–10.2)
Chloride: 99 mmol/L (ref 96–106)
Creatinine, Ser: 0.68 mg/dL — ABNORMAL LOW (ref 0.76–1.27)
GFR calc Af Amer: 103 mL/min/{1.73_m2} (ref >59)
GFR calc non Af Amer: 89 mL/min/{1.73_m2} (ref >59)
Globulin, Total: 2.8 g/dL (ref 1.5–4.5)
Glucose: 203 mg/dL — ABNORMAL HIGH (ref 65–99)
Potassium: 4.3 mmol/L (ref 3.5–5.2)
Sodium: 136 mmol/L (ref 134–144)
Total Protein: 7.1 g/dL (ref 6.0–8.5)

## 2016-08-10 LAB — CBC WITH DIFFERENTIAL/PLATELET
Basophils Absolute: 0 10*3/uL (ref 0.0–0.2)
Basos: 0 %
EOS (ABSOLUTE): 0.1 10*3/uL (ref 0.0–0.4)
Eos: 1 %
Hematocrit: 33 % — ABNORMAL LOW (ref 37.5–51.0)
Hemoglobin: 11 g/dL — ABNORMAL LOW (ref 13.0–17.7)
Immature Grans (Abs): 0 10*3/uL (ref 0.0–0.1)
Immature Granulocytes: 0 %
Lymphocytes Absolute: 0.7 10*3/uL (ref 0.7–3.1)
Lymphs: 15 %
MCH: 29.6 pg (ref 26.6–33.0)
MCHC: 33.3 g/dL (ref 31.5–35.7)
MCV: 89 fL (ref 79–97)
Monocytes Absolute: 0.3 10*3/uL (ref 0.1–0.9)
Monocytes: 7 %
Neutrophils Absolute: 3.9 10*3/uL (ref 1.4–7.0)
Neutrophils: 77 %
Platelets: 151 10*3/uL (ref 150–379)
RBC: 3.72 x10E6/uL — ABNORMAL LOW (ref 4.14–5.80)
RDW: 14.5 % (ref 12.3–15.4)
WBC: 5.1 10*3/uL (ref 3.4–10.8)

## 2016-08-10 MED ORDER — IOPAMIDOL (ISOVUE-300) INJECTION 61%
100.0000 mL | Freq: Once | INTRAVENOUS | Status: AC | PRN
  Administered 2016-08-10: 100 mL via INTRAVENOUS

## 2016-08-11 ENCOUNTER — Telehealth: Payer: Self-pay | Admitting: Family Medicine

## 2016-08-11 DIAGNOSIS — R748 Abnormal levels of other serum enzymes: Secondary | ICD-10-CM

## 2016-08-11 NOTE — Telephone Encounter (Signed)
Patient's labs and CT were essentially normal except some mild fatty liver disease except his liver function. His liver function was very elevated and it was normal just a few months ago. I want him to come back today or tomorrow and do follow-up labs which I have put in for his hepatitis panel and repeat CBC and a repeat liver function. I will also put an ultrasound right upper quadrant to be done as well. Arville CareJoshua Collen Hostler, MD [facility] 08/11/2016, 1:19 PM

## 2016-08-12 ENCOUNTER — Other Ambulatory Visit: Payer: Medicare Other

## 2016-08-12 ENCOUNTER — Other Ambulatory Visit: Payer: Self-pay

## 2016-08-12 DIAGNOSIS — R945 Abnormal results of liver function studies: Principal | ICD-10-CM

## 2016-08-12 DIAGNOSIS — R7989 Other specified abnormal findings of blood chemistry: Secondary | ICD-10-CM | POA: Diagnosis not present

## 2016-08-12 DIAGNOSIS — Z7289 Other problems related to lifestyle: Secondary | ICD-10-CM | POA: Diagnosis not present

## 2016-08-13 ENCOUNTER — Telehealth: Payer: Self-pay | Admitting: Family Medicine

## 2016-08-13 LAB — HEPATIC FUNCTION PANEL
ALT: 175 IU/L — ABNORMAL HIGH (ref 0–44)
AST: 77 IU/L — ABNORMAL HIGH (ref 0–40)
Albumin: 4.2 g/dL (ref 3.5–4.7)
Alkaline Phosphatase: 181 IU/L — ABNORMAL HIGH (ref 39–117)
Bilirubin Total: 0.3 mg/dL (ref 0.0–1.2)
Bilirubin, Direct: 0.14 mg/dL (ref 0.00–0.40)
Total Protein: 7.1 g/dL (ref 6.0–8.5)

## 2016-08-13 LAB — CBC WITH DIFFERENTIAL/PLATELET
Basophils Absolute: 0 10*3/uL (ref 0.0–0.2)
Basos: 1 %
EOS (ABSOLUTE): 0.1 10*3/uL (ref 0.0–0.4)
Eos: 3 %
Hematocrit: 33 % — ABNORMAL LOW (ref 37.5–51.0)
Hemoglobin: 10.9 g/dL — ABNORMAL LOW (ref 13.0–17.7)
Immature Grans (Abs): 0 10*3/uL (ref 0.0–0.1)
Immature Granulocytes: 0 %
Lymphocytes Absolute: 1.2 10*3/uL (ref 0.7–3.1)
Lymphs: 27 %
MCH: 29.1 pg (ref 26.6–33.0)
MCHC: 33 g/dL (ref 31.5–35.7)
MCV: 88 fL (ref 79–97)
Monocytes Absolute: 0.5 10*3/uL (ref 0.1–0.9)
Monocytes: 10 %
Neutrophils Absolute: 2.6 10*3/uL (ref 1.4–7.0)
Neutrophils: 59 %
Platelets: 182 10*3/uL (ref 150–379)
RBC: 3.74 x10E6/uL — ABNORMAL LOW (ref 4.14–5.80)
RDW: 14.9 % (ref 12.3–15.4)
WBC: 4.3 10*3/uL (ref 3.4–10.8)

## 2016-08-13 LAB — HEPATITIS PANEL, ACUTE
Hep A IgM: NEGATIVE
Hep B C IgM: NEGATIVE
Hep C Virus Ab: <0.1 s/co ratio (ref 0.0–0.9)
Hepatitis B Surface Ag: NEGATIVE

## 2016-08-13 LAB — LIPASE: Lipase: 50 U/L (ref 13–78)

## 2016-08-13 NOTE — Telephone Encounter (Signed)
Patient's liver function panel is greatly improved on second check and his wife is also improved. Please make sure he still goes and gets his right upper quadrant abdominal ultrasound. Arville CareJoshua Oasis Goehring, MD [facility] 08/13/2016, 8:07 AM

## 2016-08-17 ENCOUNTER — Other Ambulatory Visit: Payer: Self-pay | Admitting: Family Medicine

## 2016-08-17 ENCOUNTER — Ambulatory Visit (INDEPENDENT_AMBULATORY_CARE_PROVIDER_SITE_OTHER): Payer: Medicare Other | Admitting: Family Medicine

## 2016-08-17 ENCOUNTER — Encounter: Payer: Self-pay | Admitting: Family Medicine

## 2016-08-17 VITALS — BP 105/64 | HR 51 | Temp 97.1°F | Ht 61.5 in | Wt 157.0 lb

## 2016-08-17 DIAGNOSIS — I10 Essential (primary) hypertension: Secondary | ICD-10-CM | POA: Diagnosis not present

## 2016-08-17 DIAGNOSIS — Z6830 Body mass index (BMI) 30.0-30.9, adult: Secondary | ICD-10-CM

## 2016-08-17 DIAGNOSIS — D649 Anemia, unspecified: Secondary | ICD-10-CM

## 2016-08-17 DIAGNOSIS — R748 Abnormal levels of other serum enzymes: Secondary | ICD-10-CM | POA: Diagnosis not present

## 2016-08-17 DIAGNOSIS — E119 Type 2 diabetes mellitus without complications: Secondary | ICD-10-CM

## 2016-08-17 LAB — BAYER DCA HB A1C WAIVED: HB A1C (BAYER DCA - WAIVED): 7.2 % — ABNORMAL HIGH (ref <7.0)

## 2016-08-17 NOTE — Progress Notes (Signed)
BP 105/64   Pulse (!) 51   Temp 97.1 F (36.2 C) (Oral)   Ht 5' 1.5" (1.562 m)   Wt 157 lb (71.2 kg)   BMI 29.18 kg/m    Subjective:    Patient ID: Roberto Miller, male    DOB: [birth year], 83 y.o.   MRN: [id number]  HPI: Baldemar Dady is a 83 y.o. male presenting on 08/17/2016 for Hypertension (3 mo) and Diabetes   HPI Hypertension Patient is currently on Metoprolol and lisinopril, and her blood pressure today is 105/64. Patient denies any lightheadedness or dizziness. Patient denies headaches, blurred vision, chest pains, shortness of breath, or weakness. Denies any side effects from medication and is content with current medication.   Type 2 diabetes mellitus Patient comes in today for recheck of his diabetes. Patient has been currently taking Janumet. Patient is currently on an ACE inhibitor. Patient has not seen an ophthalmologist this year. Patient denies any issues with his feet.   Elevated liver enzymes Patient is coming in for follow-up on elevated liver enzymes. His recheck and testing for hepatitis on the second follow-up came back improved and hepatitis negative. He has an ultrasound scheduled for this coming week of the right upper quadrant. He denies any abdominal pain. The only thing that has changed is he is taking terbinafine for onychomycosis and we have instructed him to stop it now. He denies any yellowing of skin or abdominal pain.  Relevant past medical, surgical, family and social history reviewed and updated as indicated. Interim medical history since our last visit reviewed. Allergies and medications reviewed and updated.  Review of Systems  Constitutional: Negative for chills and fever.  Respiratory: Negative for shortness of breath and wheezing.   Cardiovascular: Negative for chest pain and leg swelling.  Gastrointestinal: Negative for abdominal pain, blood in stool, constipation, diarrhea, nausea and vomiting.  Musculoskeletal: Negative for back pain  and gait problem.  Skin: Negative for rash.  All other systems reviewed and are negative.   Per HPI unless specifically indicated above     Objective:    BP 105/64   Pulse (!) 51   Temp 97.1 F (36.2 C) (Oral)   Ht 5' 1.5" (1.562 m)   Wt 157 lb (71.2 kg)   BMI 29.18 kg/m   Wt Readings from Last 3 Encounters:  08/17/16 157 lb (71.2 kg)  08/09/16 157 lb (71.2 kg)  05/17/16 157 lb 8 oz (71.4 kg)    Physical Exam  Constitutional: He is oriented to person, place, and time. He appears well-developed and well-nourished. No distress.  Eyes: Conjunctivae are normal. No scleral icterus.  Neck: Neck supple. No thyromegaly present.  Cardiovascular: Normal rate, regular rhythm, normal heart sounds and intact distal pulses.   No murmur heard. Pulmonary/Chest: Effort normal and breath sounds normal. No respiratory distress. He has no wheezes. He has no rales.  Abdominal: Soft. Bowel sounds are normal. He exhibits no distension and no mass. There is no tenderness.  Musculoskeletal: Normal range of motion. He exhibits no edema.  Lymphadenopathy:    He has no cervical adenopathy.  Neurological: He is alert and oriented to person, place, and time. Coordination normal.  Skin: Skin is warm and dry. No rash noted. He is not diaphoretic.  Psychiatric: He has a normal mood and affect. His behavior is normal.  Nursing note and vitals reviewed.     Assessment & Plan:   Problem List Items Addressed This Visit      Cardiovascular  and Mediastinum   Hypertension - Primary     Endocrine   Diabetes ([place])   Relevant Orders   Bayer DCA Hb A1c Waived (Completed)     Other   BMI 30.0-30.9,adult   Absolute anemia   Relevant Orders   CBC with Differential/Platelet (Completed)    Other Visit Diagnoses    Elevated liver enzymes       Relevant Orders   CMP14+EGFR (Completed)       Follow up plan: Return in about 3 months (around 11/17/2016), or if symptoms worsen or fail to improve, for dm,  htn.  Counseling provided for all of the vaccine components Orders Placed This Encounter  Procedures  . Bayer DCA Hb A1c Waived  . CBC with Differential/Platelet  . Vernon Kady Toothaker, MD [facility] 08/17/2016, 5:03 PM

## 2016-08-18 LAB — CBC WITH DIFFERENTIAL/PLATELET
Basophils Absolute: 0 10*3/uL (ref 0.0–0.2)
Basos: 0 %
EOS (ABSOLUTE): 0.1 10*3/uL (ref 0.0–0.4)
Eos: 3 %
Hematocrit: 32.7 % — ABNORMAL LOW (ref 37.5–51.0)
Hemoglobin: 10.9 g/dL — ABNORMAL LOW (ref 13.0–17.7)
Immature Grans (Abs): 0 10*3/uL (ref 0.0–0.1)
Immature Granulocytes: 0 %
Lymphocytes Absolute: 1.2 10*3/uL (ref 0.7–3.1)
Lymphs: 25 %
MCH: 30.1 pg (ref 26.6–33.0)
MCHC: 33.3 g/dL (ref 31.5–35.7)
MCV: 90 fL (ref 79–97)
Monocytes Absolute: 0.3 10*3/uL (ref 0.1–0.9)
Monocytes: 6 %
Neutrophils Absolute: 3.3 10*3/uL (ref 1.4–7.0)
Neutrophils: 66 %
Platelets: 188 10*3/uL (ref 150–379)
RBC: 3.62 x10E6/uL — ABNORMAL LOW (ref 4.14–5.80)
RDW: 14.3 % (ref 12.3–15.4)
WBC: 5 10*3/uL (ref 3.4–10.8)

## 2016-08-18 LAB — CMP14+EGFR
ALT: 54 IU/L — ABNORMAL HIGH (ref 0–44)
AST: 21 IU/L (ref 0–40)
Albumin/Globulin Ratio: 1.4 (ref 1.2–2.2)
Albumin: 4.1 g/dL (ref 3.5–4.7)
Alkaline Phosphatase: 124 IU/L — ABNORMAL HIGH (ref 39–117)
BUN/Creatinine Ratio: 27 — ABNORMAL HIGH (ref 10–24)
BUN: 24 mg/dL (ref 8–27)
Bilirubin Total: 0.2 mg/dL (ref 0.0–1.2)
CO2: 21 mmol/L (ref 18–29)
Calcium: 9.1 mg/dL (ref 8.6–10.2)
Chloride: 103 mmol/L (ref 96–106)
Creatinine, Ser: 0.9 mg/dL (ref 0.76–1.27)
GFR calc Af Amer: 92 mL/min/{1.73_m2} (ref >59)
GFR calc non Af Amer: 79 mL/min/{1.73_m2} (ref >59)
Globulin, Total: 3 g/dL (ref 1.5–4.5)
Glucose: 187 mg/dL — ABNORMAL HIGH (ref 65–99)
Potassium: 4.8 mmol/L (ref 3.5–5.2)
Sodium: 137 mmol/L (ref 134–144)
Total Protein: 7.1 g/dL (ref 6.0–8.5)

## 2016-08-18 NOTE — Telephone Encounter (Signed)
Patient seen yesterday 05/29 with [name]

## 2016-08-19 ENCOUNTER — Other Ambulatory Visit: Payer: Self-pay | Admitting: *Deleted

## 2016-08-19 ENCOUNTER — Ambulatory Visit (HOSPITAL_COMMUNITY)
Admission: RE | Admit: 2016-08-19 | Discharge: 2016-08-19 | Disposition: A | Discharge status: Home / Self Care | Payer: Medicare Other | Source: Ambulatory Visit | Attending: Family Medicine | Admitting: Family Medicine

## 2016-08-19 DIAGNOSIS — R748 Abnormal levels of other serum enzymes: Secondary | ICD-10-CM

## 2016-08-19 DIAGNOSIS — K760 Fatty (change of) liver, not elsewhere classified: Secondary | ICD-10-CM | POA: Diagnosis not present

## 2016-08-27 ENCOUNTER — Ambulatory Visit (HOSPITAL_COMMUNITY): Admission: RE | Admit: 2016-08-27 | Payer: Medicare Other | Source: Ambulatory Visit

## 2016-08-28 ENCOUNTER — Other Ambulatory Visit: Payer: Self-pay | Admitting: Family Medicine

## 2016-09-09 ENCOUNTER — Other Ambulatory Visit: Payer: Self-pay | Admitting: Family Medicine

## 2016-11-09 ENCOUNTER — Other Ambulatory Visit: Payer: Self-pay | Admitting: Family Medicine

## 2016-11-09 DIAGNOSIS — D649 Anemia, unspecified: Secondary | ICD-10-CM

## 2016-11-13 ENCOUNTER — Other Ambulatory Visit: Payer: Self-pay | Admitting: Family Medicine

## 2016-11-13 DIAGNOSIS — E119 Type 2 diabetes mellitus without complications: Secondary | ICD-10-CM

## 2016-11-15 NOTE — Telephone Encounter (Signed)
Next OV 11/18/16

## 2016-11-18 ENCOUNTER — Encounter: Payer: Self-pay | Admitting: Family Medicine

## 2016-11-18 ENCOUNTER — Ambulatory Visit (INDEPENDENT_AMBULATORY_CARE_PROVIDER_SITE_OTHER): Payer: Medicare Other | Admitting: Family Medicine

## 2016-11-18 VITALS — BP 114/69 | HR 63 | Temp 98.7°F | Ht 61.5 in | Wt 156.0 lb

## 2016-11-18 DIAGNOSIS — E1159 Type 2 diabetes mellitus with other circulatory complications: Secondary | ICD-10-CM | POA: Diagnosis not present

## 2016-11-18 DIAGNOSIS — E119 Type 2 diabetes mellitus without complications: Secondary | ICD-10-CM | POA: Diagnosis not present

## 2016-11-18 DIAGNOSIS — I10 Essential (primary) hypertension: Secondary | ICD-10-CM | POA: Diagnosis not present

## 2016-11-18 DIAGNOSIS — D509 Iron deficiency anemia, unspecified: Secondary | ICD-10-CM | POA: Diagnosis not present

## 2016-11-18 LAB — BAYER DCA HB A1C WAIVED: HB A1C (BAYER DCA - WAIVED): 7.3 % — ABNORMAL HIGH (ref <7.0)

## 2016-11-18 MED ORDER — CLOTRIMAZOLE-BETAMETHASONE 1-0.05 % EX CREA: 1.0000 "application " | TOPICAL_CREAM | Freq: Two times a day (BID) | CUTANEOUS | 5 refills | Status: DC

## 2016-11-18 MED ORDER — GLIPIZIDE 10 MG PO TABS: 10.0000 mg | ORAL_TABLET | Freq: Two times a day (BID) | ORAL | 1 refills | Status: DC

## 2016-11-18 MED ORDER — LISINOPRIL 5 MG PO TABS: 10.0000 mg | ORAL_TABLET | Freq: Every day | ORAL | 1 refills | Status: DC

## 2016-11-18 MED ORDER — FERROUS SULFATE 325 (65 FE) MG PO TABS: 325.0000 mg | ORAL_TABLET | Freq: Every morning | ORAL | 2 refills | Status: DC

## 2016-11-18 MED ORDER — SITAGLIPTIN PHOS-METFORMIN HCL 50-1000 MG PO TABS: 1.0000 | ORAL_TABLET | Freq: Two times a day (BID) | ORAL | 1 refills | Status: DC

## 2016-11-18 MED ORDER — METOPROLOL TARTRATE 75 MG PO TABS: 75.0000 mg | ORAL_TABLET | Freq: Two times a day (BID) | ORAL | 1 refills | Status: DC

## 2016-11-18 NOTE — Progress Notes (Signed)
BP 114/69   Pulse 63   Temp 98.7 F (37.1 C) (Oral)   Ht 5' 1.5" (1.562 m)   Wt 156 lb (70.8 kg)   BMI 29.00 kg/m    Subjective:    Patient ID: Roberto Miller, male    DOB: 04/21/1931, 83 y.o.   MRN: [id number]  HPI: Roberto Miller is a 83 y.o. male presenting on 11/18/2016 for Diabetes (3 mo) and Hypertension   HPI Type 2 diabetes mellitus Patient comes in today for recheck of his diabetes. Patient has been currently taking Janumet and glipizide. Patient is currently on an ACE inhibitor/ARB. Patient has not seen an ophthalmologist this year. Patient denies any issues with their feet.   Hypertension Patient is currently on lisinopril 5, and their blood pressure today is 114/69. Patient denies any lightheadedness or dizziness. Patient denies headaches, blurred vision, chest pains, shortness of breath, or weakness. Denies any side effects from medication and is content with current medication.   Anemia recheck Patient is coming in for an anemia recheck. He denies any shortness of breath or chest pain or lightheadedness or dizziness. He denies any bleeding from anywhere that he knows of.  Relevant past medical, surgical, family and social history reviewed and updated as indicated. Interim medical history since our last visit reviewed. Allergies and medications reviewed and updated.  Review of Systems  Constitutional: Negative for chills and fever.  Eyes: Negative for discharge.  Respiratory: Negative for shortness of breath and wheezing.   Cardiovascular: Negative for chest pain and leg swelling.  Musculoskeletal: Negative for back pain and gait problem.  Skin: Negative for rash.  Neurological: Negative for dizziness, weakness, light-headedness, numbness and headaches.  All other systems reviewed and are negative.   Per HPI unless specifically indicated above   Allergies as of 11/18/2016   No Known Allergies     Medication List       Accurate as of 11/18/16  4:46 PM.  Always use your most recent med list.          ferrous sulfate 325 (65 FE) MG tablet TAKE ONE TABLET EVERY MORNING   folic acid 1 MG tablet Commonly known as:  FOLVITE TAKE ONE (1) TABLET EACH DAY   glipiZIDE 10 MG tablet Commonly known as:  GLUCOTROL TAKE ONE TABLET BY MOUTH TWICE DAILY   glucose blood test strip Commonly known as:  ONETOUCH VERIO Check blood sugar one time er day and prn   Dx E11.9   JANUMET 50-1000 MG tablet Generic drug:  sitaGLIPtin-metformin TAKE ONE TABLET TWICE A DAY WITH FOOD   lisinopril 5 MG tablet Commonly known as:  PRINIVIL,ZESTRIL TAKE TWO TABLETS BY MOUTH DAILY   Metoprolol Tartrate 75 MG Tabs Take 75 mg by mouth 2 (two) times daily.   metoprolol tartrate 25 MG tablet Commonly known as:  LOPRESSOR TAKE THREE TABLETS TWICE DAILY   ONETOUCH DELICA LANCETS 33G Misc Check blood sugar 1x per day and prn   Dx E11.9            Discharge Care Instructions        Start     Ordered   11/18/16 0000  Bayer DCA Hb A1c Waived     11/18/16 1645         Objective:    BP 114/69   Pulse 63   Temp 98.7 F (37.1 C) (Oral)   Ht 5' 1.5" (1.562 m)   Wt 156 lb (70.8 kg)   BMI 29.00 kg/m  Wt Readings from Last 3 Encounters:  11/18/16 156 lb (70.8 kg)  08/17/16 157 lb (71.2 kg)  08/09/16 157 lb (71.2 kg)    Physical Exam  Constitutional: He is oriented to person, place, and time. He appears well-developed and well-nourished. No distress.  Eyes: Conjunctivae are normal. No scleral icterus.  Neck: Neck supple. No thyromegaly present.  Cardiovascular: Normal rate, regular rhythm, normal heart sounds and intact distal pulses.   No murmur heard. Pulmonary/Chest: Effort normal and breath sounds normal. No respiratory distress. He has no wheezes.  Musculoskeletal: Normal range of motion. He exhibits no edema.  Lymphadenopathy:    He has no cervical adenopathy.  Neurological: He is alert and oriented to person, place, and time.  Coordination normal.  Skin: Skin is warm and dry. No rash noted. He is not diaphoretic.  Psychiatric: He has a normal mood and affect. His behavior is normal.  Nursing note and vitals reviewed.       Assessment & Plan:   Problem List Items Addressed This Visit      Cardiovascular and Mediastinum   Hypertension associated with diabetes (HCC)   Relevant Medications   glipiZIDE (GLUCOTROL) 10 MG tablet   lisinopril (PRINIVIL,ZESTRIL) 5 MG tablet   metoprolol tartrate 75 MG TABS   sitaGLIPtin-metformin (JANUMET) 50-1000 MG tablet     Endocrine   Diabetes (HCC) - Primary   Relevant Medications   glipiZIDE (GLUCOTROL) 10 MG tablet   lisinopril (PRINIVIL,ZESTRIL) 5 MG tablet   sitaGLIPtin-metformin (JANUMET) 50-1000 MG tablet   Other Relevant Orders   Bayer DCA Hb A1c Waived (Completed)     Other   Absolute anemia   Relevant Medications   ferrous sulfate 325 (65 FE) MG tablet       Follow up plan: Return in about 3 months (around 02/18/2017), or if symptoms worsen or fail to improve, for Hypertension and diabetes.  Counseling provided for all of the vaccine components Orders Placed This Encounter  Procedures  . Bayer [facility] Hb A1c Waived    Arville Care, MD [facility] 11/18/2016, 4:46 PM

## 2016-11-23 ENCOUNTER — Other Ambulatory Visit: Payer: Self-pay | Admitting: Family Medicine

## 2016-11-23 DIAGNOSIS — L2089 Other atopic dermatitis: Secondary | ICD-10-CM

## 2016-12-24 ENCOUNTER — Other Ambulatory Visit: Payer: Self-pay | Admitting: Family Medicine

## 2017-03-23 ENCOUNTER — Other Ambulatory Visit: Payer: Self-pay | Admitting: Family Medicine

## 2017-04-15 ENCOUNTER — Other Ambulatory Visit: Payer: Self-pay | Admitting: Family Medicine

## 2017-04-15 DIAGNOSIS — E119 Type 2 diabetes mellitus without complications: Secondary | ICD-10-CM

## 2017-04-18 NOTE — Telephone Encounter (Signed)
Last seen 11/18/16  Dr Louanne Skyeettinger

## 2017-05-05 ENCOUNTER — Encounter: Payer: Self-pay | Admitting: Family Medicine

## 2017-05-05 ENCOUNTER — Ambulatory Visit (INDEPENDENT_AMBULATORY_CARE_PROVIDER_SITE_OTHER): Payer: Medicare Other | Admitting: Family Medicine

## 2017-05-05 VITALS — BP 118/62 | HR 64 | Temp 97.1°F | Ht 61.5 in | Wt 155.4 lb

## 2017-05-05 DIAGNOSIS — E119 Type 2 diabetes mellitus without complications: Secondary | ICD-10-CM

## 2017-05-05 DIAGNOSIS — Z6830 Body mass index (BMI) 30.0-30.9, adult: Secondary | ICD-10-CM | POA: Diagnosis not present

## 2017-05-05 DIAGNOSIS — I10 Essential (primary) hypertension: Secondary | ICD-10-CM | POA: Diagnosis not present

## 2017-05-05 DIAGNOSIS — E1159 Type 2 diabetes mellitus with other circulatory complications: Secondary | ICD-10-CM

## 2017-05-05 LAB — BAYER DCA HB A1C WAIVED: HB A1C (BAYER DCA - WAIVED): 7.4 % — ABNORMAL HIGH (ref <7.0)

## 2017-05-05 NOTE — Progress Notes (Signed)
BP 118/62   Pulse 64   Temp (!) 97.1 F (36.2 C) (Oral)   Ht 5' 1.5" (1.562 m)   Wt 155 lb 6 oz (70.5 kg)   BMI 28.88 kg/m    Subjective:    Patient ID: Roberto Miller, male    DOB: [birth year], 84 y.o.   MRN: [id number]  HPI: Roberto Miller is a 84 y.o. male presenting on 05/05/2017 for Hypertension and Diabetes   HPI Hypertension Patient is currently on lisinopril and metoprolol, and their blood pressure today is 118/62. Patient denies any lightheadedness or dizziness. Patient denies headaches, blurred vision, chest pains, shortness of breath, or weakness. Denies any side effects from medication and is content with current medication.   Type 2 diabetes mellitus Patient comes in today for recheck of his diabetes. Patient has been currently taking glipizide and Janumet. Patient is currently on an ACE inhibitor/ARB. Patient has not seen an ophthalmologist this year. Patient denies any issues with their feet.   Relevant past medical, surgical, family and social history reviewed and updated as indicated. Interim medical history since our last visit reviewed. Allergies and medications reviewed and updated.  Review of Systems  Constitutional: Negative for chills and fever.  Respiratory: Negative for shortness of breath and wheezing.   Cardiovascular: Negative for chest pain and leg swelling.  Musculoskeletal: Negative for back pain and gait problem.  Skin: Negative for rash.  Neurological: Negative for dizziness, weakness, light-headedness and headaches.  All other systems reviewed and are negative.   Per HPI unless specifically indicated above   Allergies as of 05/05/2017   No Known Allergies     Medication List        Accurate as of 05/05/17  1:32 PM. Always use your most recent med list.          clotrimazole-betamethasone cream Commonly known as:  LOTRISONE Apply 1 application topically 2 (two) times daily.   ferrous sulfate 325 (65 FE) MG tablet Take 1 tablet  (325 mg total) by mouth every morning.   folic acid 1 MG tablet Commonly known as:  FOLVITE TAKE ONE (1) TABLET EACH DAY   glipiZIDE 10 MG tablet Commonly known as:  GLUCOTROL Take 1 tablet (10 mg total) by mouth 2 (two) times daily.   glucose blood test strip Commonly known as:  ONETOUCH VERIO Check blood sugar one time er day and prn   Dx E11.9   JANUMET 50-1000 MG tablet Generic drug:  sitaGLIPtin-metformin TAKE ONE TABLET TWICE A DAY WITH FOOD   lisinopril 5 MG tablet Commonly known as:  PRINIVIL,ZESTRIL TAKE TWO TABLETS BY MOUTH DAILY   Metoprolol Tartrate 75 MG Tabs Take 75 mg by mouth 2 (two) times daily.   ONETOUCH DELICA LANCETS 92C Misc Check blood sugar 1x per day and prn   Dx E11.9          Objective:    BP 118/62   Pulse 64   Temp (!) 97.1 F (36.2 C) (Oral)   Ht 5' 1.5" (1.562 m)   Wt 155 lb 6 oz (70.5 kg)   BMI 28.88 kg/m   Wt Readings from Last 3 Encounters:  05/05/17 155 lb 6 oz (70.5 kg)  11/18/16 156 lb (70.8 kg)  08/17/16 157 lb (71.2 kg)    Physical Exam  Constitutional: He is oriented to person, place, and time. He appears well-developed and well-nourished. No distress.  Eyes: Conjunctivae are normal. No scleral icterus.  Neck: Neck supple. No thyromegaly present.  Cardiovascular: Normal rate, regular rhythm, normal heart sounds and intact distal pulses.  No murmur heard. Pulmonary/Chest: Effort normal and breath sounds normal. No respiratory distress. He has no wheezes. He has no rales.  Musculoskeletal: Normal range of motion.  Lymphadenopathy:    He has no cervical adenopathy.  Neurological: He is alert and oriented to person, place, and time. Coordination normal.  Skin: Skin is warm and dry. No rash noted. He is not diaphoretic.  Psychiatric: He has a normal mood and affect. His behavior is normal.  Nursing note and vitals reviewed.      Assessment & Plan:   Problem List Items Addressed This Visit      Cardiovascular and  Mediastinum   Hypertension associated with diabetes ([place]) - Primary   Relevant Orders   CMP14+EGFR   Lipid panel     Endocrine   Diabetes ([place])   Relevant Orders   Bayer DCA Hb A1c Waived   CMP14+EGFR     Other   BMI 30.0-30.9,adult   Relevant Orders   Lipid panel       Follow up plan: Return in about 6 months (around 11/02/2017), or if symptoms worsen or fail to improve, for Hypertension and diabetes recheck.  Counseling provided for all of the vaccine components No orders of the defined types were placed in this encounter.   Caryl Pina, MD [facility] 05/05/2017, 1:32 PM

## 2017-05-06 LAB — CMP14+EGFR
ALT: 17 IU/L (ref 0–44)
AST: 22 IU/L (ref 0–40)
Albumin/Globulin Ratio: 1.6 (ref 1.2–2.2)
Albumin: 4.3 g/dL (ref 3.5–4.7)
Alkaline Phosphatase: 79 IU/L (ref 39–117)
BUN/Creatinine Ratio: 21 (ref 10–24)
BUN: 18 mg/dL (ref 8–27)
Bilirubin Total: 0.4 mg/dL (ref 0.0–1.2)
CO2: 21 mmol/L (ref 20–29)
Calcium: 9.6 mg/dL (ref 8.6–10.2)
Chloride: 103 mmol/L (ref 96–106)
Creatinine, Ser: 0.84 mg/dL (ref 0.76–1.27)
GFR calc Af Amer: 94 mL/min/{1.73_m2} (ref >59)
GFR calc non Af Amer: 81 mL/min/{1.73_m2} (ref >59)
Globulin, Total: 2.7 g/dL (ref 1.5–4.5)
Glucose: 122 mg/dL — ABNORMAL HIGH (ref 65–99)
Potassium: 4.2 mmol/L (ref 3.5–5.2)
Sodium: 138 mmol/L (ref 134–144)
Total Protein: 7 g/dL (ref 6.0–8.5)

## 2017-05-06 LAB — LIPID PANEL
Chol/HDL Ratio: 2.9 ratio (ref 0.0–5.0)
Cholesterol, Total: 151 mg/dL (ref 100–199)
HDL: 52 mg/dL (ref >39)
LDL Calculated: 77 mg/dL (ref 0–99)
Triglycerides: 108 mg/dL (ref 0–149)
VLDL Cholesterol Cal: 22 mg/dL (ref 5–40)

## 2017-05-11 ENCOUNTER — Encounter: Payer: Self-pay | Admitting: *Deleted

## 2017-05-11 ENCOUNTER — Ambulatory Visit (INDEPENDENT_AMBULATORY_CARE_PROVIDER_SITE_OTHER): Payer: Medicare Other | Admitting: *Deleted

## 2017-05-11 VITALS — BP 137/69 | HR 55 | Ht 62.0 in | Wt 156.0 lb

## 2017-05-11 DIAGNOSIS — Z0000 Encounter for general adult medical examination without abnormal findings: Secondary | ICD-10-CM

## 2017-05-11 NOTE — Patient Instructions (Addendum)
Vacuna antigripal (inactivada o recombinante): lo que debe saber (Influenza [Flu] Vaccine [Inactivated or Recombinant]: What You Need to Know) 1. Por qu vacunarse? La influenza ("gripe") es una enfermedad contagiosa que se propaga en todo el territorio de los [place] cada [place], por lo general entre octubre y [place]o. La gripe es causada por los virus de la influenza y se propaga principalmente por la tos, el estornudo y Customer service managerel contacto directo. Cualquier persona puede tener gripe. Esta enfermedad comienza repentinamente y puede durar [address]varios das. Los sntomas varan segn la edad, [place]pero pueden incluir:  Grant RutsFiebre o escalofros.  Dolor de Advertising copywritergarganta.  Dolores musculares.  Fatiga.  Tos.  Dolor de Turkmenistancabeza.  Secrecin o congestin nasal. La gripe, adems, puede derivar en neumona e infecciones de la sangre, y causar diarrea y convulsiones en los nios. Si tiene una afeccin, por ejemplo, enfermedad cardaca o pulmonar, la gripe puede empeorarla. En algunas personas, la gripe es ms peligrosa. Los bebs y los nios pequeos, los L-3 Communicationsmayores de 65aos, las Benton Cityembarazadas, as Avon Productscomo las personas que tienen ciertas enfermedades o cuyo sistema inmunitario est debilitado corren Science writerun riesgo mayor. Cada ao, miles de Foot Lockerpersonas mueren en los [place] debido a la gripe, y muchas ms deben ser hospitalizadas. La vacuna antigripal puede:  Protegerlo contra la gripe.  Hacer que la gripe sea menos grave en caso de que la contraiga.  Evitar que se la transmita a su familia y a Economistotras personas. 2. Vacunas antigripales inactivadas y recombinantes Cada temporada de gripe, se recomienda la aplicacin de una dosis de vacuna antigripal. Es posible que los nios menores de 6meses a 8aos deban recibir dos dosis durante la misma temporada de gripe. Todas las Darden Restaurantsdems personas tienen que aplicarse una sola dosis cada temporada de gripe. Algunas de las vacunas antigripales inactivadas contienen una cantidad muy pequea  de un conservante a base de mercurio llamado timerosal. Algunos estudios han demostrado que el timerosal en las vacunas no es perjudicial, pero se dispone de vacunas antigripales que no contienen el conservante. Las vacunas antigripales no se elaboran con el virus vivo de la gripe. No pueden causar gripe. Hay muchos virus de la gripe, y Estate agentestos mutan permanentemente. Cada ao, se elabora una nueva vacuna antigripal para brindar proteccin contra tres o cuatro virus que probablemente causen la enfermedad en la siguiente temporada de gripe. Pero incluso si la vacuna no es especfica para estos virus, aun as puede brindar cierta proteccin. La vacuna antigripal no puede evitar:  La gripe causada por un virus que la vacuna no puede prevenir.  Las Massachusetts Mutual Lifeenfermedades que se parecen a la gripe, pero que no lo son. La vacuna comienza a surtir efecto aproximadamente 2semanas despus de su aplicacin y brinda proteccin durante la temporada de gripe. 3. Algunas personas no deben recibir esta vacuna Informe a la persona que le aplica la vacuna:  Si tiene Federalsburgalergias graves, potencialmente mortales. Si alguna vez tuvo una reaccin alrgica potencialmente mortal despus de recibir una dosis de la vacuna antigripal, o tuvo una alergia grave a cualquiera de los componentes de esta vacuna, es posible que se le recomiende no vacunarse. La Harley-Davidsonmayora de los tipos de vacuna antigripal, si bien no todos, contienen C[place] d'[place]voireuna pequea cantidad de protena de Hollymeadhuevo.  Si alguna vez tuvo sndrome de Guillain-Barre (tambin llamado GBS). Algunas personas con antecedentes de GBS no deben recibir esta vacuna. Debe hablar al respecto con el mdico.  Si no se siente bien. Por lo general, puede recibir la vacuna antigripal si  tiene una enfermedad leve; sin embargo, podran pedirle que regrese cuando se sienta mejor. 4. Riesgos de [place] reaccin a la vacuna Con cualquier medicamento, incluidas las vacunas, existe la posibilidad de que Celanese Corporation. Estas suelen ser leves y desaparecer por s solas, pero tambin es posible que se presenten reacciones graves. La Harley-Davidson de las personas a las que se les aplica la vacuna antigripal no tienen ningn problema. Los problemas menores despus de la aplicacin de la vacuna antigripal incluyen:  Engineer, mining, enrojecimiento o Paramedic en el que le aplicaron la vacuna.  Ronquera.  Dolor, enrojecimiento o The [employer] ojos.  Tos.  Grant Ruts.  Dolores.  Dolor de [place].  Picazn.  Fatiga. Si estos problemas ocurren, en general comienzan poco despus de la aplicacin de la vacuna y duran 1 o 2das. Los problemas ms graves despus de la aplicacin de la vacuna antigripal pueden incluir lo siguiente:  Puede haber un pequeo aumento del riesgo de sufrir sndrome de Guillain-Barre (GBS) despus de la aplicacin de la vacuna antigripal inactivada. El clculo estimativo de este riesgo es de 1 o 2casos por cada milln de personas vacunadas, mucho ms bajo que el riesgo de sufrir complicaciones graves debido a la gripe, que pueden evitarse con la vacuna antigripal.  Los nios pequeos que reciben la vacuna antigripal junto con la vacuna antineumoccica (PCV13) o la DTaP en el mismo momento pueden tener una probabilidad un poco ms elevada de tener una convulsin debido a la fiebre. Consulte al mdico para obtener ms informacin. Informe al mdico si un nio que est recibiendo la vacuna antigripal ha tenido una convulsin alguna vez. Problemas que podran ocurrir despus de cualquier vacuna inyectable:  Las personas a veces se desmayan despus de un procedimiento mdico, incluida la vacunacin. Permanecer sentado o recostado durante puede ayudar a Lubrizol Corporation y las lesiones causadas por las cadas. Informe al mdico si se siente mareado, tiene cambios en la visin o zumbidos en los odos.  Algunas personas sienten un dolor intenso en el hombro y tienen dificultad  para mover el brazo donde se coloc la vacuna. Esto sucede con muy poca frecuencia.  Cualquier medicamento puede causar una reaccin alrgica grave. Dichas reacciones son Lynnae Sandhoff poco frecuentes con una vacuna (se calcula que menos de 1en un milln de dosis) y se producen unos minutos a unas horas despus de la vacunacin. Al igual que con cualquier Automatic Data, existe una probabilidad muy remota de que una vacuna cause una lesin grave o la [place]. Se controla permanentemente la seguridad de las vacunas. Para obtener ms informacin, visite: http://floyd.org/. 5. Qu pasa si hay una reaccin grave? A qu signos debo estar atento?  Observe todo lo que le preocupe, como signos de una reaccin alrgica grave, fiebre muy alta o comportamiento fuera de lo normal. Los signos de una reaccin alrgica grave pueden incluir ronchas, hinchazn de la cara y la garganta, dificultad para respirar, latidos cardacos acelerados, mareos y debilidad. Pueden comenzar entre unos pocos minutos y algunas horas despus de la vacunacin. Qu debo hacer?  Si cree que se trata de una reaccin alrgica grave o de otra emergencia que no puede esperar, llame [phone] o lleve a la persona al hospital ms cercano. De lo contrario, llame al American Express.  Las reacciones deben informarse al Sistema de Informacin sobre Efectos Adversos de las Administrator, arts (Vaccine Adverse Event Reporting System, VAERS). El mdico debe presentar este informe, o bien puede hacerlo usted mismo a travs del sitio  web de VAERS, en www.vaers.LAgents.no, o llamando al [phone]. VAERSno ofrece consejos mdicos. 6. SunTrust de Compensacin de Daos por American Electric Power [place] de Compensacin de Daos por Administrator, arts (National Vaccine Injury Compensation Program, VICP) es un programa federal que fue creado para Patent examiner a las personas que puedan haber sufrido daos al recibir ciertas vacunas. Aquellas personas que consideren que han sufrido un  dao como consecuencia de una vacuna y [place] saber ms acerca del programa y de cmo presentar un reclamo, pueden llamar al [phone] o visitar el sitio web del VICP en SpiritualWord.at. Hay un lmite de tiempo para presentar un reclamo de compensacin. 7. Cmo puedo obtener ms informacin?  Pregntele al mdico. Este puede darle el prospecto de la vacuna o recomendarle otras fuentes de informacin.  Comunquese con el servicio de salud de su localidad o [address].  Comunquese con los Centros para el Control y la Prevencin de Child psychotherapist for Disease Control and Prevention, CDC): ? Llame al 5097011897 (1-800-CDC-INFO) o ? visite el sitio web [employer] en BiotechRoom.com.cy. Declaracin de informacin sobre la vacuna Vacuna antigripal inactivada (10/26/2013) Esta informacin no tiene Theme park manager el consejo del mdico. Asegrese de hacerle al mdico cualquier pregunta que tenga. Document Released: 06/04/2008 Document Revised: 03/29/2014 Document Reviewed: 10/29/2013 Elsevier Interactive Patient Education  2017 ArvinMeritor.    Mr. Mclees , Thank you for taking time to come for your Medicare Wellness Visit. I appreciate your ongoing commitment to your health goals. Please review the following plan we discussed and let me know if I can assist you in the future.   This is a list of the screening recommended for you and due dates:  Health Maintenance  Topic Date Due  . Eye exam for diabetics  11/24/2016  . Flu Shot  07/04/2017*  . Complete foot exam   05/17/2017  . Hemoglobin A1C  11/02/2017  . Tetanus Vaccine  11/18/2020  . Pneumonia vaccines  Completed  *Topic was postponed. The date shown is not the original due date.

## 2017-05-13 NOTE — Progress Notes (Signed)
Subjective:   Roberto Miller is a 84 y.o. male who presents for an Initial Medicare Annual Wellness Visit. Mr Roberto Miller is accompanied by his daughter and his wife.   Review of Systems  His health is about the same as last year  Cardiac Risk Factors include: advanced age (>2555men, 59>65 women);diabetes mellitus;hypertension;male gender    Objective:    Today's Vitals   05/11/17 1423  BP: 137/69  Pulse: (!) 55  Weight: 156 lb (70.8 kg)  Height: 5\' 2"  (1.575 m)   Body mass index is 28.53 kg/m.  Advanced Directives 04/01/2014  Does Patient Have a Medical Advance Directive? No  Would patient like information on creating a medical advance directive? Yes - Educational materials given    Current Medications (verified) Outpatient Encounter Medications as of 05/11/2017  Medication Sig  . clotrimazole-betamethasone (LOTRISONE) cream Apply 1 application topically 2 (two) times daily.  . ferrous sulfate 325 (65 FE) MG tablet Take 1 tablet (325 mg total) by mouth every morning.  . folic acid (FOLVITE) 1 MG tablet TAKE ONE (1) TABLET EACH DAY  . glipiZIDE (GLUCOTROL) 10 MG tablet Take 1 tablet (10 mg total) by mouth 2 (two) times daily.  Marland Kitchen. glucose blood (ONETOUCH VERIO) test strip Check blood sugar one time er day and prn   Dx E11.9  . JANUMET 50-1000 MG tablet TAKE ONE TABLET TWICE A DAY WITH FOOD  . lisinopril (PRINIVIL,ZESTRIL) 5 MG tablet TAKE TWO TABLETS BY MOUTH DAILY  . metoprolol tartrate 75 MG TABS Take 75 mg by mouth 2 (two) times daily.  [name]. ONETOUCH DELICA LANCETS 33G MISC Check blood sugar 1x per day and prn   Dx E11.9   No facility-administered encounter medications on file as of 05/11/2017.     Allergies (verified) Patient has no known allergies.   History: Past Medical History:  Diagnosis Date  . Diabetes mellitus without complication (HCC)   . Hyperlipidemia   . Hypertension    No past surgical history on file. Family History  Problem Relation Age of Onset  .  Diabetes Sister   . Diabetes Brother   . Diabetes Sister    Social History   Socioeconomic History  . Marital status: Married    Spouse name: Not on file  . Number of children: 9  . Years of education: Not on file  . Highest education level: Not on file  Social Needs  . Financial resource strain: Not hard at all  . Food insecurity - worry: Never true  . Food insecurity - inability: Never true  . Transportation needs - medical: No  . Transportation needs - non-medical: No  Occupational History  . Occupation: retired  Tobacco Use  . Smoking status: Never Smoker  . Smokeless tobacco: Never Used  Substance and Sexual Activity  . Alcohol use: No  . Drug use: No  . Sexual activity: No  Other Topics Concern  . Not on file  Social History Narrative  . Not on file   Clinical Intake:    Pain : No/denies pain     Diabetes: Yes CBG done?: No Did pt. bring in CBG monitor from home?: No  How often do you need to have someone help you when you read instructions, pamphlets, or other written materials from your doctor or pharmacy?: 1 - Never     Comments: daughter was present during visit Information entered by :: Demetrios LollKristen Tameria Patti, RN  Activities of Daily Living In your present state of health, do you  have any difficulty performing the following activities: 05/11/2017  Hearing? N  Vision? Y  Comment some mild blurred vision. Last exam was 6 monts ago  Difficulty concentrating or making decisions? N  Walking or climbing stairs? N  Dressing or bathing? N  Doing errands, shopping? N  Preparing Food and eating ? N  Using the Toilet? N  In the past six months, have you accidently leaked urine? N  Do you have problems with loss of bowel control? N  Managing your Medications? N  Managing your Finances? N  Housekeeping or managing your Housekeeping? N  Some recent data might be hidden     Immunizations and Health Maintenance Immunization History  Administered Date(s)  Administered  . Influenza,inj,Quad PF,6+ Mos 12/15/2012, 12/27/2013, 01/22/2016  . Pneumococcal Conjugate-13 07/04/2014  . Pneumococcal Polysaccharide-23 11/19/2010   Health Maintenance Due  Topic Date Due  . OPHTHALMOLOGY EXAM  11/24/2016    Patient Care Team: [name], Elige Radon, MD as PCP - General (Family Medicine)  No hospitalizations, ER visits, or surgeries this past year.      Assessment:   This is a routine wellness examination for [name].  Hearing/Vision screen No deficits noted during visit.   Dietary issues and exercise activities discussed: Current Exercise Habits: Home exercise routine, Type of exercise: walking, Time (Minutes): 30, Frequency (Times/Week): 7, Weekly Exercise (Minutes/Week): 210, Intensity: Mild  Goals Aim for 150 minutes of moderate activity a week  Depression Screen PHQ 2/9 Scores 05/11/2017 05/05/2017 11/18/2016 08/17/2016  PHQ - 2 Score 0 0 0 0    Fall Risk Fall Risk  05/11/2017 05/05/2017 11/18/2016 08/17/2016 08/09/2016  Falls in the past year? No No No No No  Number falls in past yr: - - - - -  Injury with Fall? - - - - -  Risk for fall due to : - - - - -  Follow up - - - - -    Cognitive Function: MMSE - Mini Mental State Exam 05/11/2017  Not completed: Unable to complete        Screening Tests Health Maintenance  Topic Date Due  . OPHTHALMOLOGY EXAM  11/24/2016  . INFLUENZA VACCINE  07/04/2017 (Originally 10/20/2016)  . FOOT EXAM  05/17/2017  . HEMOGLOBIN A1C  11/02/2017  . TETANUS/TDAP  11/18/2020  . PNA vac Low Risk Adult  Completed     Plan:  Keep f/u with PCP Aim for 150 minutes of moderate activity a week Flu vaccine given today  I have personally reviewed and noted the following in the patient's chart:   . Medical and social history . Use of alcohol, tobacco or illicit drugs  . Current medications and supplements . Functional ability and status . Nutritional status . Physical activity . Advanced  directives . List of other physicians . Hospitalizations, surgeries, and ER visits in previous 12 months . Vitals . Screenings to include cognitive, depression, and falls . Referrals and appointments  In addition, I have reviewed and discussed with patient certain preventive protocols, quality metrics, and best practice recommendations. A written personalized care plan for preventive services as well as general preventive health recommendations were provided to patient.     Demetrios Loll, RN   05/13/2017

## 2017-06-17 ENCOUNTER — Other Ambulatory Visit: Payer: Self-pay | Admitting: Family Medicine

## 2017-07-13 ENCOUNTER — Other Ambulatory Visit: Payer: Self-pay | Admitting: Family Medicine

## 2017-07-13 DIAGNOSIS — E119 Type 2 diabetes mellitus without complications: Secondary | ICD-10-CM

## 2017-08-08 ENCOUNTER — Ambulatory Visit (INDEPENDENT_AMBULATORY_CARE_PROVIDER_SITE_OTHER): Payer: Medicare Other | Admitting: Family Medicine

## 2017-08-08 ENCOUNTER — Encounter: Payer: Self-pay | Admitting: Family Medicine

## 2017-08-08 VITALS — BP 106/63 | HR 60 | Temp 97.1°F | Ht 62.0 in | Wt 155.0 lb

## 2017-08-08 DIAGNOSIS — I10 Essential (primary) hypertension: Secondary | ICD-10-CM | POA: Diagnosis not present

## 2017-08-08 DIAGNOSIS — E119 Type 2 diabetes mellitus without complications: Secondary | ICD-10-CM | POA: Diagnosis not present

## 2017-08-08 DIAGNOSIS — E1159 Type 2 diabetes mellitus with other circulatory complications: Secondary | ICD-10-CM | POA: Diagnosis not present

## 2017-08-08 DIAGNOSIS — Z6830 Body mass index (BMI) 30.0-30.9, adult: Secondary | ICD-10-CM

## 2017-08-08 LAB — BAYER DCA HB A1C WAIVED: HB A1C (BAYER DCA - WAIVED): 6.9 % (ref <7.0)

## 2017-08-08 NOTE — Progress Notes (Signed)
BP 106/63   Pulse 60   Temp (!) 97.1 F (36.2 C) (Oral)   Ht  (1.575 m)   Wt 155 lb (70.3 kg)   BMI 28.35 kg/m    Subjective:    Patient ID: Roberto Miller, male    DOB: 28-Sep-1931, 84 y.o.   MRN: [id number]  HPI: Roberto Miller is a 84 y.o. male presenting on 08/08/2017 for Hypertension and Diabetes   HPI Type 2 diabetes mellitus Patient comes in today for recheck of his diabetes. Patient has been currently taking Janumet. Patient is currently on an ACE inhibitor/ARB. Patient has not seen an ophthalmologist this year. Patient denies any issues with their feet.   Hypertension Patient is currently on lisinopril and metoprolol, and their blood pressure today is 106/63. Patient denies any lightheadedness or dizziness. Patient denies headaches, blurred vision, chest pains, shortness of breath, or weakness. Denies any side effects from medication and is content with current medication.   Relevant past medical, surgical, family and social history reviewed and updated as indicated. Interim medical history since our last visit reviewed. Allergies and medications reviewed and updated.  Review of Systems  Constitutional: Negative for chills and fever.  Respiratory: Negative for shortness of breath and wheezing.   Cardiovascular: Negative for chest pain and leg swelling.  Musculoskeletal: Negative for back pain and gait problem.  Skin: Negative for rash.  Neurological: Negative for dizziness, weakness, light-headedness and numbness.  All other systems reviewed and are negative.   Per HPI unless specifically indicated above   Allergies as of 08/08/2017   No Known Allergies     Medication List        Accurate as of 08/08/17  1:30 PM. Always use your most recent med list.          ferrous sulfate 325 (65 FE) MG tablet Take 1 tablet (325 mg total) by mouth every morning.   folic acid 1 MG tablet Commonly known as:  FOLVITE TAKE ONE (1) TABLET EACH DAY   glipiZIDE 10  MG tablet Commonly known as:  GLUCOTROL Take 1 tablet (10 mg total) by mouth 2 (two) times daily.   glucose blood test strip Commonly known as:  ONETOUCH VERIO Check blood sugar one time er day and prn   Dx E11.9   JANUMET 50-1000 MG tablet Generic drug:  sitaGLIPtin-metformin TAKE ONE TABLET TWICE A DAY WITH FOOD   lisinopril 5 MG tablet Commonly known as:  PRINIVIL,ZESTRIL TAKE TWO TABLETS BY MOUTH DAILY   metoprolol tartrate 25 MG tablet Commonly known as:  LOPRESSOR TAKE THREE TABLETS TWICE DAILY   ONETOUCH DELICA LANCETS 33G Misc Check blood sugar 1x per day and prn   Dx E11.9          Objective:    BP 106/63   Pulse 60   Temp (!) 97.1 F (36.2 C) (Oral)   Ht  (1.575 m)   Wt 155 lb (70.3 kg)   BMI 28.35 kg/m   Wt Readings from Last 3 Encounters:  08/08/17 155 lb (70.3 kg)  05/11/17 156 lb (70.8 kg)  05/05/17 155 lb 6 oz (70.5 kg)    Physical Exam  Constitutional: He is oriented to person, place, and time. He appears well-developed and well-nourished. No distress.  Eyes: Conjunctivae are normal. No scleral icterus.  Neck: Neck supple. No thyromegaly present.  Cardiovascular: Normal rate, regular rhythm, normal heart sounds and intact distal pulses.  No murmur heard. Pulmonary/Chest: Effort normal and breath sounds  normal. No respiratory distress. He has no wheezes.  Musculoskeletal: Normal range of motion. He exhibits no edema.  Lymphadenopathy:    He has no cervical adenopathy.  Neurological: He is alert and oriented to person, place, and time. Coordination normal.  Skin: Skin is warm and dry. No rash noted. He is not diaphoretic.  Psychiatric: He has a normal mood and affect. His behavior is normal.  Nursing note and vitals reviewed.   Diabetic Foot Exam - Simple   Simple Foot Form Diabetic Foot exam was performed with the following findings:  Yes 08/08/2017  2:11 PM  Visual Inspection No deformities, no ulcerations, no other skin breakdown  bilaterally:  Yes Sensation Testing Intact to touch and monofilament testing bilaterally:  Yes Pulse Check Posterior Tibialis and Dorsalis pulse intact bilaterally:  Yes Comments Onychomycosis on both great toenails        Assessment & Plan:   Problem List Items Addressed This Visit      Cardiovascular and Mediastinum   Hypertension associated with diabetes (HCC)     Endocrine   Diabetes (HCC) - Primary   Relevant Orders   Microalbumin / creatinine urine ratio   Bayer DCA Hb A1c Waived     Other   BMI 30.0-30.9,adult       Follow up plan: Return in about 3 months (around 11/08/2017), or if symptoms worsen or fail to improve, for Diabetes and hypertension.  Counseling provided for all of the vaccine components Orders Placed This Encounter  Procedures  . Microalbumin / creatinine urine ratio  . Bayer [facility] Hb A1c Waived    Arville Care, MD [facility] 08/08/2017, 1:30 PM

## 2017-08-09 LAB — MICROALBUMIN / CREATININE URINE RATIO
Creatinine, Urine: 200.8 mg/dL
Microalb/Creat Ratio: 351.9 mg/g creat — ABNORMAL HIGH (ref 0.0–30.0)
Microalbumin, Urine: 706.7 ug/mL

## 2017-08-16 ENCOUNTER — Other Ambulatory Visit: Payer: Self-pay | Admitting: Family Medicine

## 2017-08-16 DIAGNOSIS — E119 Type 2 diabetes mellitus without complications: Secondary | ICD-10-CM

## 2017-09-05 ENCOUNTER — Ambulatory Visit (INDEPENDENT_AMBULATORY_CARE_PROVIDER_SITE_OTHER): Payer: Medicare Other | Admitting: Family Medicine

## 2017-09-05 ENCOUNTER — Encounter: Payer: Self-pay | Admitting: Family Medicine

## 2017-09-05 VITALS — BP 127/70 | HR 51 | Temp 97.3°F | Ht 62.0 in | Wt 156.0 lb

## 2017-09-05 DIAGNOSIS — H6123 Impacted cerumen, bilateral: Secondary | ICD-10-CM | POA: Diagnosis not present

## 2017-09-05 DIAGNOSIS — H6121 Impacted cerumen, right ear: Secondary | ICD-10-CM

## 2017-09-05 DIAGNOSIS — H918X1 Other specified hearing loss, right ear: Secondary | ICD-10-CM

## 2017-09-05 NOTE — Progress Notes (Signed)
Patient had to leave prior to be seen by physician

## 2017-09-05 NOTE — Progress Notes (Signed)
BP 127/70 (BP Location: Left Arm)   Pulse (!) 51   Temp (!) 97.3 F (36.3 C) (Oral)   Ht 5\' 2"  (1.575 m)   Wt 156 lb (70.8 kg)   BMI 28.53 kg/m    Subjective:    Patient ID: Roberto Miller, male    DOB: 10-15-1931, 84 y.o.   MRN: [id number]  HPI: Roberto Miller is a 84 y.o. male presenting on 09/05/2017 for Hearing Loss   HPI Hearing loss and plugged ears Patient comes in complaining of 3 weeks of hearing loss in his right ear where he feels like it is blocked or stopped up and he cannot hear at all in it.  He has a little bit in his left ear as well of the same.  He denies any pain or fevers or chills or drainage.  He denies any congestion or nasal drainage or sore throat.  He has been using over-the-counter remedies for the past 3 weeks and trying to get it to clear up with it is not working and so is coming in today to see if he can get it cleaned out by [place].  Relevant past medical, surgical, family and social history reviewed and updated as indicated. Interim medical history since our last visit reviewed. Allergies and medications reviewed and updated.  Review of Systems  Constitutional: Negative for chills and fever.  HENT: Positive for hearing loss. Negative for ear discharge and ear pain.   Respiratory: Negative for shortness of breath and wheezing.   Cardiovascular: Negative for chest pain and leg swelling.  Musculoskeletal: Negative for back pain and gait problem.  Skin: Negative for rash.  All other systems reviewed and are negative.   Per HPI unless specifically indicated above   Allergies as of 09/05/2017   No Known Allergies     Medication List        Accurate as of 09/05/17  4:03 PM. Always use your most recent med list.          ferrous sulfate 325 (65 FE) MG tablet Take 1 tablet (325 mg total) by mouth every morning.   folic acid 1 MG tablet Commonly known as:  FOLVITE TAKE ONE (1) TABLET EACH DAY   glipiZIDE 10 MG tablet Commonly known as:   GLUCOTROL Take 1 tablet (10 mg total) by mouth 2 (two) times daily.   glucose blood test strip Commonly known as:  ONETOUCH VERIO Check blood sugar one time er day and prn   Dx E11.9   JANUMET 50-1000 MG tablet Generic drug:  sitaGLIPtin-metformin TAKE ONE TABLET TWICE A DAY WITH FOOD   lisinopril 5 MG tablet Commonly known as:  PRINIVIL,ZESTRIL TAKE TWO TABLETS BY MOUTH DAILY   metoprolol tartrate 25 MG tablet Commonly known as:  LOPRESSOR TAKE THREE TABLETS TWICE DAILY   ONETOUCH DELICA LANCETS 33G Misc Check blood sugar 1x per day and prn   Dx E11.9          Objective:    BP 127/70 (BP Location: Left Arm)   Pulse (!) 51   Temp (!) 97.3 F (36.3 C) (Oral)   Ht 5\' 2"  (1.575 m)   Wt 156 lb (70.8 kg)   BMI 28.53 kg/m   Wt Readings from Last 3 Encounters:  09/05/17 156 lb (70.8 kg)  09/05/17 156 lb (70.8 kg)  08/08/17 155 lb (70.3 kg)    Physical Exam  Constitutional: He is oriented to person, place, and time. He appears well-developed and well-nourished. No  distress.  HENT:  Cerumen impaction bilaterally  Eyes: Conjunctivae are normal. No scleral icterus.  Neck: Neck supple. No thyromegaly present.  Lymphadenopathy:    He has no cervical adenopathy.  Neurological: He is alert and oriented to person, place, and time. Coordination normal.  Skin: Skin is warm and dry. No rash noted. He is not diaphoretic.  Psychiatric: He has a normal mood and affect. His behavior is normal.  Nursing note and vitals reviewed.      Assessment & Plan:   Problem List Items Addressed This Visit    None    Visit Diagnoses    Hearing loss due to cerumen impaction, right    -  Primary     Nurse to lavage, patient tolerated well, able to clear.  Return as needed  Follow up plan: Return if symptoms worsen or fail to improve.  Counseling provided for all of the vaccine components No orders of the defined types were placed in this encounter.   Arville CareJoshua Tobechukwu Emmick, MD [facility] 09/05/2017, 4:03 PM

## 2017-09-15 ENCOUNTER — Other Ambulatory Visit: Payer: Self-pay | Admitting: Family Medicine

## 2017-10-15 ENCOUNTER — Other Ambulatory Visit: Payer: Self-pay | Admitting: Family Medicine

## 2017-10-17 NOTE — Telephone Encounter (Signed)
Ov 11/08/17

## 2017-11-08 ENCOUNTER — Ambulatory Visit (INDEPENDENT_AMBULATORY_CARE_PROVIDER_SITE_OTHER): Payer: Medicare Other | Admitting: Family Medicine

## 2017-11-08 ENCOUNTER — Encounter: Payer: Self-pay | Admitting: Family Medicine

## 2017-11-08 VITALS — BP 100/64 | HR 56 | Temp 97.5°F | Ht 62.0 in | Wt 154.8 lb

## 2017-11-08 DIAGNOSIS — I152 Hypertension secondary to endocrine disorders: Secondary | ICD-10-CM

## 2017-11-08 DIAGNOSIS — E1159 Type 2 diabetes mellitus with other circulatory complications: Secondary | ICD-10-CM | POA: Diagnosis not present

## 2017-11-08 DIAGNOSIS — E119 Type 2 diabetes mellitus without complications: Secondary | ICD-10-CM

## 2017-11-08 DIAGNOSIS — Z6830 Body mass index (BMI) 30.0-30.9, adult: Secondary | ICD-10-CM | POA: Diagnosis not present

## 2017-11-08 DIAGNOSIS — I10 Essential (primary) hypertension: Secondary | ICD-10-CM

## 2017-11-08 DIAGNOSIS — D509 Iron deficiency anemia, unspecified: Secondary | ICD-10-CM

## 2017-11-08 LAB — BAYER DCA HB A1C WAIVED: HB A1C (BAYER DCA - WAIVED): 7.7 % — ABNORMAL HIGH (ref <7.0)

## 2017-11-08 MED ORDER — LISINOPRIL 5 MG PO TABS: 10.0000 mg | ORAL_TABLET | Freq: Every day | ORAL | 3 refills | Status: DC

## 2017-11-08 MED ORDER — METOPROLOL TARTRATE 25 MG PO TABS: ORAL_TABLET | ORAL | 1 refills | Status: DC

## 2017-11-08 MED ORDER — SITAGLIPTIN PHOS-METFORMIN HCL 50-1000 MG PO TABS: ORAL_TABLET | ORAL | 2 refills | Status: DC

## 2017-11-08 NOTE — Progress Notes (Signed)
BP 100/64   Pulse (!) 56   Temp (!) 97.5 F (36.4 C) (Oral)   Ht 5' 2" (1.575 m)   Wt 154 lb 12.8 oz (70.2 kg)   BMI 28.31 kg/m    Subjective:    Patient ID: Roberto Miller, male    DOB: 1932/01/12, 84 y.o.   MRN: [id number]  HPI: Roberto Miller is a 84 y.o. male presenting on 11/08/2017 for Diabetes (3 month follow up) and Hypertension   HPI Type 2 diabetes mellitus Patient comes in today for recheck of his diabetes. Patient has been currently taking Janumet and glipizide. Patient is currently on an ACE inhibitor/ARB. Patient has not seen an ophthalmologist this year. Patient denies any issues with their feet.   Hypertension Patient is currently on lisinopril and metoprolol, blood pressure down so we will cut metoprolol back a little bit, and their blood pressure today is 100/64. Patient denies any lightheadedness or dizziness. Patient denies headaches, blurred vision, chest pains, shortness of breath, or weakness. Denies any side effects from medication and is content with current medication.   Anemia/iron deficiency Patient has been taking iron for some time because of iron deficiency anemia and is wondering if he saw [place] to take it.  We will check his blood counts today and have him hold it for now until we figure out if that is correct or not.  Relevant past medical, surgical, family and social history reviewed and updated as indicated. Interim medical history since our last visit reviewed. Allergies and medications reviewed and updated.  Review of Systems  Constitutional: Negative for chills and fever.  Eyes: Negative for visual disturbance.  Respiratory: Negative for shortness of breath and wheezing.   Cardiovascular: Negative for chest pain and leg swelling.  Musculoskeletal: Negative for back pain and gait problem.  Skin: Negative for rash.  Neurological: Negative for dizziness, weakness, light-headedness, numbness and headaches.  All other systems reviewed and are  negative.   Per HPI unless specifically indicated above   Allergies as of 11/08/2017   No Known Allergies     Medication List        Accurate as of 11/08/17  1:56 PM. Always use your most recent med list.          folic acid 1 MG tablet Commonly known as:  FOLVITE TAKE ONE (1) TABLET EACH DAY   glipiZIDE 10 MG tablet Commonly known as:  GLUCOTROL Take 1 tablet (10 mg total) by mouth 2 (two) times daily.   glucose blood test strip Check blood sugar one time er day and prn   Dx E11.9   lisinopril 5 MG tablet Commonly known as:  PRINIVIL,ZESTRIL Take 2 tablets (10 mg total) by mouth daily.   metoprolol tartrate 25 MG tablet Commonly known as:  LOPRESSOR TAKE THREE TABLETS TWICE DAILY   ONETOUCH DELICA LANCETS 34V Misc Check blood sugar 1x per day and prn   Dx E11.9   sitaGLIPtin-metformin 50-1000 MG tablet Commonly known as:  JANUMET TAKE ONE TABLET TWICE A DAY WITH FOOD          Objective:    BP 100/64   Pulse (!) 56   Temp (!) 97.5 F (36.4 C) (Oral)   Ht 5' 2" (1.575 m)   Wt 154 lb 12.8 oz (70.2 kg)   BMI 28.31 kg/m   Wt Readings from Last 3 Encounters:  11/08/17 154 lb 12.8 oz (70.2 kg)  09/05/17 156 lb (70.8 kg)  09/05/17 156 lb (70.8  kg)    Physical Exam  Constitutional: He is oriented to person, place, and time. He appears well-developed and well-nourished. No distress.  Eyes: Conjunctivae are normal. No scleral icterus.  Neck: Neck supple. No thyromegaly present.  Cardiovascular: Normal rate, regular rhythm, normal heart sounds and intact distal pulses.  No murmur heard. Pulmonary/Chest: Effort normal and breath sounds normal. No respiratory distress. He has no wheezes.  Musculoskeletal: Normal range of motion. He exhibits no edema.  Lymphadenopathy:    He has no cervical adenopathy.  Neurological: He is alert and oriented to person, place, and time. Coordination normal.  Skin: Skin is warm and dry. No rash noted. He is not diaphoretic.    Psychiatric: He has a normal mood and affect. His behavior is normal.  Nursing note and vitals reviewed.       Assessment & Plan:   Problem List Items Addressed This Visit      Cardiovascular and Mediastinum   Hypertension associated with diabetes ([place])   Relevant Medications   sitaGLIPtin-metformin (JANUMET) 50-1000 MG tablet   lisinopril (PRINIVIL,ZESTRIL) 5 MG tablet   metoprolol tartrate (LOPRESSOR) 25 MG tablet   Other Relevant Orders   CMP14+EGFR   Lipid panel     Endocrine   Diabetes ([place]) - Primary   Relevant Medications   sitaGLIPtin-metformin (JANUMET) 50-1000 MG tablet   lisinopril (PRINIVIL,ZESTRIL) 5 MG tablet   Other Relevant Orders   Bayer DCA Hb A1c Waived   Lipid panel     Other   BMI 30.0-30.9,adult   Relevant Orders   Lipid panel   Absolute anemia   Relevant Orders   CBC with Differential/Platelet      We will cut metoprolol down to where he is only taking 2 tablets twice a day instead of 3 tablets twice a day because blood pressure is down and heart rate is down.  Patient is asymptomatic.  Continue medications for diabetes including Janumet and lisinopril and check A1c.  Follow up plan: Return in about 3 months (around 02/08/2018), or if symptoms worsen or fail to improve, for Diabetes and hypertension recheck.  Counseling provided for all of the vaccine components Orders Placed This Encounter  Procedures  . Bayer DCA Hb A1c Waived  . CBC with Differential/Platelet  . CMP14+EGFR  . Lipid panel    Caryl Pina, MD [facility] 11/08/2017, 1:56 PM

## 2017-11-09 LAB — CBC WITH DIFFERENTIAL/PLATELET
Basophils Absolute: 0 10*3/uL (ref 0.0–0.2)
Basos: 1 %
EOS (ABSOLUTE): 0.1 10*3/uL (ref 0.0–0.4)
Eos: 2 %
Hematocrit: 34.5 % — ABNORMAL LOW (ref 37.5–51.0)
Hemoglobin: 11.3 g/dL — ABNORMAL LOW (ref 13.0–17.7)
Immature Grans (Abs): 0 10*3/uL (ref 0.0–0.1)
Immature Granulocytes: 0 %
Lymphocytes Absolute: 1 10*3/uL (ref 0.7–3.1)
Lymphs: 23 %
MCH: 29.5 pg (ref 26.6–33.0)
MCHC: 32.8 g/dL (ref 31.5–35.7)
MCV: 90 fL (ref 79–97)
Monocytes Absolute: 0.3 10*3/uL (ref 0.1–0.9)
Monocytes: 6 %
Neutrophils Absolute: 3 10*3/uL (ref 1.4–7.0)
Neutrophils: 68 %
Platelets: 175 10*3/uL (ref 150–450)
RBC: 3.83 x10E6/uL — ABNORMAL LOW (ref 4.14–5.80)
RDW: 14.1 % (ref 12.3–15.4)
WBC: 4.3 10*3/uL (ref 3.4–10.8)

## 2017-11-09 LAB — LIPID PANEL
Chol/HDL Ratio: 3.7 ratio (ref 0.0–5.0)
Cholesterol, Total: 153 mg/dL (ref 100–199)
HDL: 41 mg/dL (ref >39)
LDL Calculated: 68 mg/dL (ref 0–99)
Triglycerides: 220 mg/dL — ABNORMAL HIGH (ref 0–149)
VLDL Cholesterol Cal: 44 mg/dL — ABNORMAL HIGH (ref 5–40)

## 2017-11-09 LAB — CMP14+EGFR
ALT: 13 IU/L (ref 0–44)
AST: 21 IU/L (ref 0–40)
Albumin/Globulin Ratio: 1.3 (ref 1.2–2.2)
Albumin: 3.9 g/dL (ref 3.5–4.7)
Alkaline Phosphatase: 75 IU/L (ref 39–117)
BUN/Creatinine Ratio: 21 (ref 10–24)
BUN: 22 mg/dL (ref 8–27)
Bilirubin Total: 0.3 mg/dL (ref 0.0–1.2)
CO2: 20 mmol/L (ref 20–29)
Calcium: 9.3 mg/dL (ref 8.6–10.2)
Chloride: 103 mmol/L (ref 96–106)
Creatinine, Ser: 1.05 mg/dL (ref 0.76–1.27)
GFR calc Af Amer: 75 mL/min/{1.73_m2} (ref >59)
GFR calc non Af Amer: 65 mL/min/{1.73_m2} (ref >59)
Globulin, Total: 3.1 g/dL (ref 1.5–4.5)
Glucose: 213 mg/dL — ABNORMAL HIGH (ref 65–99)
Potassium: 4.5 mmol/L (ref 3.5–5.2)
Sodium: 141 mmol/L (ref 134–144)
Total Protein: 7 g/dL (ref 6.0–8.5)

## 2017-12-23 ENCOUNTER — Other Ambulatory Visit: Payer: Self-pay | Admitting: Family Medicine

## 2018-01-03 ENCOUNTER — Encounter: Payer: Self-pay | Admitting: *Deleted

## 2018-01-09 DIAGNOSIS — E119 Type 2 diabetes mellitus without complications: Secondary | ICD-10-CM | POA: Diagnosis not present

## 2018-01-09 DIAGNOSIS — M47816 Spondylosis without myelopathy or radiculopathy, lumbar region: Secondary | ICD-10-CM | POA: Diagnosis not present

## 2018-01-09 DIAGNOSIS — M545 Low back pain: Secondary | ICD-10-CM | POA: Diagnosis not present

## 2018-01-09 DIAGNOSIS — M47819 Spondylosis without myelopathy or radiculopathy, site unspecified: Secondary | ICD-10-CM | POA: Diagnosis not present

## 2018-01-09 DIAGNOSIS — M549 Dorsalgia, unspecified: Secondary | ICD-10-CM | POA: Diagnosis not present

## 2018-01-09 DIAGNOSIS — I10 Essential (primary) hypertension: Secondary | ICD-10-CM | POA: Diagnosis not present

## 2018-01-09 DIAGNOSIS — R0781 Pleurodynia: Secondary | ICD-10-CM | POA: Diagnosis not present

## 2018-01-09 DIAGNOSIS — S20211A Contusion of right front wall of thorax, initial encounter: Secondary | ICD-10-CM | POA: Diagnosis not present

## 2018-01-09 DIAGNOSIS — S299XXA Unspecified injury of thorax, initial encounter: Secondary | ICD-10-CM | POA: Diagnosis not present

## 2018-01-09 DIAGNOSIS — S39012A Strain of muscle, fascia and tendon of lower back, initial encounter: Secondary | ICD-10-CM | POA: Diagnosis not present

## 2018-01-09 DIAGNOSIS — Z87891 Personal history of nicotine dependence: Secondary | ICD-10-CM | POA: Diagnosis not present

## 2018-01-09 DIAGNOSIS — S3992XA Unspecified injury of lower back, initial encounter: Secondary | ICD-10-CM | POA: Diagnosis not present

## 2018-01-10 DIAGNOSIS — S3992XA Unspecified injury of lower back, initial encounter: Secondary | ICD-10-CM | POA: Diagnosis not present

## 2018-01-10 DIAGNOSIS — M549 Dorsalgia, unspecified: Secondary | ICD-10-CM | POA: Diagnosis not present

## 2018-01-10 DIAGNOSIS — R0781 Pleurodynia: Secondary | ICD-10-CM | POA: Diagnosis not present

## 2018-01-10 DIAGNOSIS — S299XXA Unspecified injury of thorax, initial encounter: Secondary | ICD-10-CM | POA: Diagnosis not present

## 2018-01-18 ENCOUNTER — Ambulatory Visit: Payer: Medicare Other | Admitting: Family Medicine

## 2018-01-30 ENCOUNTER — Ambulatory Visit: Payer: Medicare Other | Admitting: Family Medicine

## 2018-02-08 ENCOUNTER — Ambulatory Visit: Payer: Medicare Other | Admitting: Family Medicine

## 2018-02-08 ENCOUNTER — Encounter: Payer: Self-pay | Admitting: Family Medicine

## 2018-03-06 ENCOUNTER — Ambulatory Visit: Payer: Self-pay | Admitting: *Deleted

## 2018-03-06 ENCOUNTER — Encounter: Payer: Self-pay | Admitting: Family Medicine

## 2018-03-06 ENCOUNTER — Ambulatory Visit (INDEPENDENT_AMBULATORY_CARE_PROVIDER_SITE_OTHER): Payer: Medicare Other | Admitting: Family Medicine

## 2018-03-06 VITALS — BP 116/66 | HR 62 | Temp 96.9°F | Ht 62.0 in | Wt 152.2 lb

## 2018-03-06 DIAGNOSIS — E1159 Type 2 diabetes mellitus with other circulatory complications: Secondary | ICD-10-CM | POA: Diagnosis not present

## 2018-03-06 DIAGNOSIS — Z23 Encounter for immunization: Secondary | ICD-10-CM | POA: Diagnosis not present

## 2018-03-06 DIAGNOSIS — E119 Type 2 diabetes mellitus without complications: Secondary | ICD-10-CM | POA: Diagnosis not present

## 2018-03-06 DIAGNOSIS — I10 Essential (primary) hypertension: Secondary | ICD-10-CM | POA: Diagnosis not present

## 2018-03-06 DIAGNOSIS — Z6830 Body mass index (BMI) 30.0-30.9, adult: Secondary | ICD-10-CM

## 2018-03-06 DIAGNOSIS — D649 Anemia, unspecified: Secondary | ICD-10-CM

## 2018-03-06 LAB — BAYER DCA HB A1C WAIVED: HB A1C (BAYER DCA - WAIVED): 7.1 % — ABNORMAL HIGH (ref <7.0)

## 2018-03-06 MED ORDER — LISINOPRIL 5 MG PO TABS: 10.0000 mg | ORAL_TABLET | Freq: Every day | ORAL | 3 refills | Status: DC

## 2018-03-06 MED ORDER — FOLIC ACID 1 MG PO TABS: ORAL_TABLET | ORAL | 3 refills | Status: DC

## 2018-03-06 MED ORDER — SITAGLIPTIN PHOS-METFORMIN HCL 50-1000 MG PO TABS: ORAL_TABLET | ORAL | 3 refills | Status: DC

## 2018-03-06 MED ORDER — GLIPIZIDE 10 MG PO TABS: 10.0000 mg | ORAL_TABLET | Freq: Two times a day (BID) | ORAL | 3 refills | Status: DC

## 2018-03-06 MED ORDER — METOPROLOL TARTRATE 25 MG PO TABS: ORAL_TABLET | ORAL | 3 refills | Status: DC

## 2018-03-06 NOTE — Progress Notes (Signed)
BP 116/66   Pulse 62   Temp (!) 96.9 F (36.1 C) (Oral)   Ht 5\' 2"  (1.575 m)   Wt 152 lb 3.2 oz (69 kg)   BMI 27.84 kg/m    Subjective:    Patient ID: Roberto Miller, male    DOB: 06/04/1931, 84 y.o.   MRN: [id number]  HPI: Roberto Miller is a 84 y.o. male presenting on 03/06/2018 for Diabetes (3 month follow up) and Hypertension   HPI Type 2 diabetes mellitus Patient comes in today for recheck of his diabetes. Patient has been currently taking Janumet and glipizide. Patient is currently on an ACE inhibitor/ARB. Patient has not seen an ophthalmologist this year. Patient denies any issues with their feet.   We discussed being overweight and obesity and dietary and lifestyle changes.  We also discussed a referral to ambulatory chronic care management.  Hypertension Patient is currently on lisinopril and metoprolol, and their blood pressure today is 116/66. Patient denies any lightheadedness or dizziness. Patient denies headaches, blurred vision, chest pains, shortness of breath, or weakness. Denies any side effects from medication and is content with current medication.   Relevant past medical, surgical, family and social history reviewed and updated as indicated. Interim medical history since our last visit reviewed. Allergies and medications reviewed and updated.  Review of Systems  Constitutional: Negative for chills and fever.  Eyes: Negative for visual disturbance.  Respiratory: Negative for shortness of breath and wheezing.   Cardiovascular: Negative for chest pain and leg swelling.  Musculoskeletal: Negative for back pain and gait problem.  Skin: Negative for rash.  Neurological: Negative for dizziness, weakness, light-headedness and numbness.  All other systems reviewed and are negative.   Per HPI unless specifically indicated above   Allergies as of 03/06/2018   No Known Allergies     Medication List       Accurate as of March 06, 2018 10:06 AM. Always  use your most recent med list.        folic acid 1 MG tablet Commonly known as:  FOLVITE TAKE ONE (1) TABLET EACH DAY   glipiZIDE 10 MG tablet Commonly known as:  GLUCOTROL Take 1 tablet (10 mg total) by mouth 2 (two) times daily.   glucose blood test strip Commonly known as:  ONETOUCH VERIO Check blood sugar one time er day and prn   Dx E11.9   lisinopril 5 MG tablet Commonly known as:  PRINIVIL,ZESTRIL Take 2 tablets (10 mg total) by mouth daily.   metoprolol tartrate 25 MG tablet Commonly known as:  LOPRESSOR TAKE two TABLETS TWICE DAILY   ONETOUCH DELICA LANCETS 33G Misc Check blood sugar 1x per day and prn   Dx E11.9   sitaGLIPtin-metformin 50-1000 MG tablet Commonly known as:  JANUMET TAKE ONE TABLET TWICE A DAY WITH FOOD          Objective:    BP 116/66   Pulse 62   Temp (!) 96.9 F (36.1 C) (Oral)   Ht 5\' 2"  (1.575 m)   Wt 152 lb 3.2 oz (69 kg)   BMI 27.84 kg/m   Wt Readings from Last 3 Encounters:  03/06/18 152 lb 3.2 oz (69 kg)  11/08/17 154 lb 12.8 oz (70.2 kg)  09/05/17 156 lb (70.8 kg)    Physical Exam Vitals signs and nursing note reviewed.  Constitutional:      General: He is not in acute distress.    Appearance: He is well-developed. He is not diaphoretic.  Eyes:     General: No scleral icterus.    Conjunctiva/sclera: Conjunctivae normal.  Neck:     Musculoskeletal: Neck supple.     Thyroid: No thyromegaly.  Cardiovascular:     Rate and Rhythm: Normal rate and regular rhythm.     Heart sounds: Normal heart sounds. No murmur.  Pulmonary:     Effort: Pulmonary effort is normal. No respiratory distress.     Breath sounds: Normal breath sounds. No wheezing.  Musculoskeletal: Normal range of motion.  Lymphadenopathy:     Cervical: No cervical adenopathy.  Skin:    General: Skin is warm and dry.     Findings: No rash.  Neurological:     Mental Status: He is alert and oriented to person, place, and time.     Coordination:  Coordination normal.  Psychiatric:        Behavior: Behavior normal.       Assessment & Plan:   Problem List Items Addressed This Visit      Cardiovascular and Mediastinum   Hypertension associated with diabetes (HCC)   Relevant Medications   glipiZIDE (GLUCOTROL) 10 MG tablet   lisinopril (PRINIVIL,ZESTRIL) 5 MG tablet   metoprolol tartrate (LOPRESSOR) 25 MG tablet   sitaGLIPtin-metformin (JANUMET) 50-1000 MG tablet   Other Relevant Orders   Ambulatory referral to Chronic Care Management Services     Endocrine   Diabetes (HCC) - Primary   Relevant Medications   glipiZIDE (GLUCOTROL) 10 MG tablet   lisinopril (PRINIVIL,ZESTRIL) 5 MG tablet   sitaGLIPtin-metformin (JANUMET) 50-1000 MG tablet   Other Relevant Orders   Bayer DCA Hb A1c Waived (Completed)   Ambulatory referral to Chronic Care Management Services     Other   BMI 30.0-30.9,adult   Relevant Orders   Ambulatory referral to Chronic Care Management Services    Other Visit Diagnoses    Encounter for immunization       Relevant Orders   Flu vaccine HIGH DOSE PF (Completed)    Patient's A1c was slightly elevated last time, it sounds like his sugars are doing better but we did some education on lifestyle modification to help improve this and will not add any medications currently but will check to see where its at and make changes from there if we need to.  Follow up plan: No follow-ups on file.  Counseling provided for all of the vaccine components Orders Placed This Encounter  Procedures  . [facility] Hb A1c Waived    Arville Care, MD [facility] 03/06/2018, 10:06 AM

## 2018-03-06 NOTE — Patient Instructions (Addendum)
I will follow up with you about getting a blood pressure monitor for use at home.   Roberto Miller was given information about Chronic Care Management services today including:  1. CCM service includes personalized support from designated clinical staff supervised by his physician, including individualized plan of care and coordination with other care providers 2. 24/7 contact phone numbers for assistance for urgent and routine care needs. 3. Service will only be billed when office clinical staff spend 20 minutes or more in a month to coordinate care. 4. Only one practitioner may furnish and bill the service in a calendar month. 5. The patient may stop CCM services at any time (effective at the end of the month) by phone call to the office staff. 6. The patient will be responsible for cost sharing (co-pay) of up to 20% of the service fee (after annual deductible is met).  Patient agreed to services and verbal consent obtained.  Goals Addressed    . "We need to help him take better care of his diabetes" (pt-stated)       Nurse Case Manager Clinical Goal(s): Over the next 30 days patient and/or healthcare proxy/daughter Roberto Miller) will verbalize understanding of need for longterm plan for management of diabetes.   Interventions: Face to face discussion with patient and healthcare proxy/daughter about rationale and need for plan of care for longterm management of diabetes; advised daughter to assist patient with CBG checks at least weekly. Provided patient/daughter with documentation tool and education about how to use appropriately for self health monitoring.      Marland Kitchen "We want to get a blood pressure monitor so we can check his blood pressure regularly" (pt-stated)       Nurse Case Manager Clinical Goal(s): Over the next 7 days, patient's healthcare proxy (dtr Roberto Miller) will verbalize understanding of health plan coverage and options for obtaining electronic/digital bp monitor for home  use   Interventions: discussed rationale and need for routine self health monitoring of bp with dtr/healthcare proxy who interpreted for patient/spouse; advised patient/proxy that CM team will investigate options and coverage for monitor          The patient verbalized understanding of instructions provided today and declined a print copy of patient instruction materials.    Chong Sicilian, RN-BC, BSN Nurse Case Manager [facility] Ph: [phone]

## 2018-03-06 NOTE — Chronic Care Management (AMB) (Signed)
  Chronic Care Management   Note  03/06/2018 Name: Roberto Miller MRN: [id number] DOB: 1931-07-17  I spoke with [name] and his daughter, who is his healthcare proxy and interpreter, today after his visit with Dr Building control surveyor to discuss CCM services.   [name] was given information about Chronic Care Management services today including:  1. CCM service includes personalized support from designated clinical staff supervised by his physician, including individualized plan of care and coordination with other care providers 2. 24/7 contact phone numbers for assistance for urgent and routine care needs. 3. Service will only be billed when office clinical staff spend 20 minutes or more in a month to coordinate care. 4. Only one practitioner may furnish and bill the service in a calendar month. 5. The patient may stop CCM services at any time (effective at the end of the month) by phone call to the office staff. 6. The patient will be responsible for cost sharing (co-pay) of up to 20% of the service fee (after annual deductible is met).  Patient agreed to services and verbal consent obtained.  Plan I will follow up on the request for a home blood pressure monitor and speak with his daughter by phone within the next 7 days.   Chong Sicilian, RN-BC, BSN Nurse Case Manager [facility] Ph: [phone]

## 2018-04-14 ENCOUNTER — Telehealth: Payer: Self-pay

## 2018-04-18 ENCOUNTER — Telehealth: Payer: Medicare Other

## 2018-05-13 IMAGING — US US ABDOMEN LIMITED
1 series · 14 of 25 positions shown · non-contrast
Comparison: CT scan of the abdomen and pelvis of August 10, 2016

CLINICAL DATA: Elevated liver enzymes for the past month. History
of fatty liver disease, diabetes, pancreatitis, hyperlipidemia.

EXAM:
US ABDOMEN LIMITED - RIGHT UPPER QUADRANT

[Series 1: us abdomen limited · 0.25mm/px · 14 of 53 slices shown]
[im 1/53]
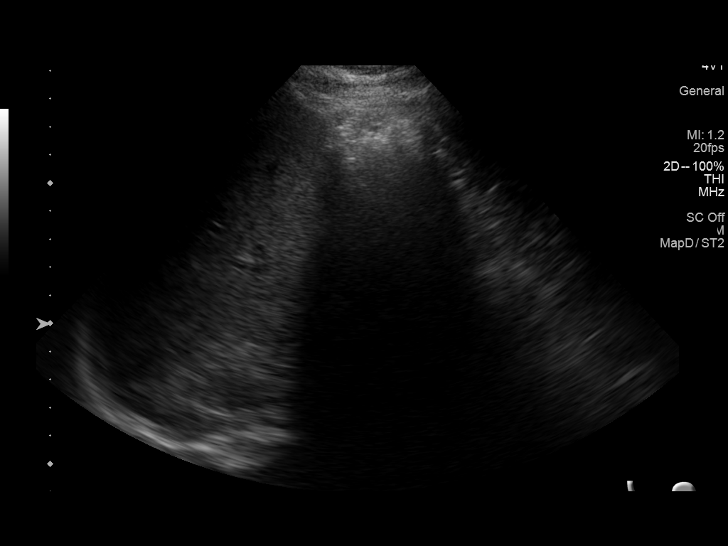
[im 5/53]
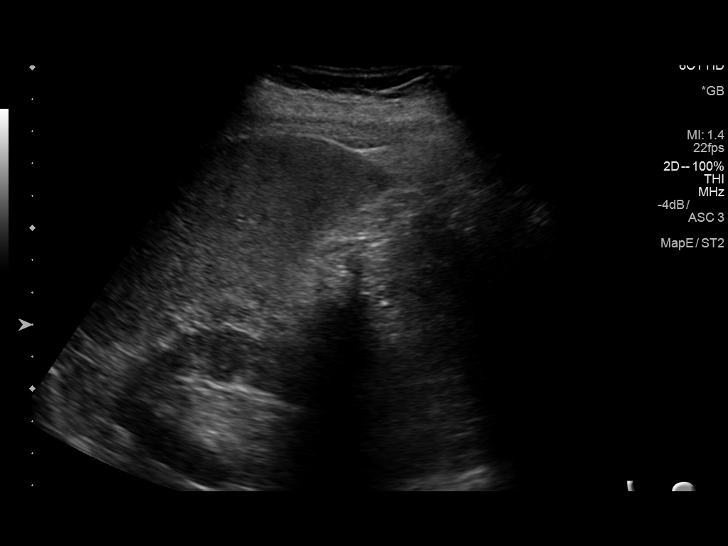
[im 9/53]
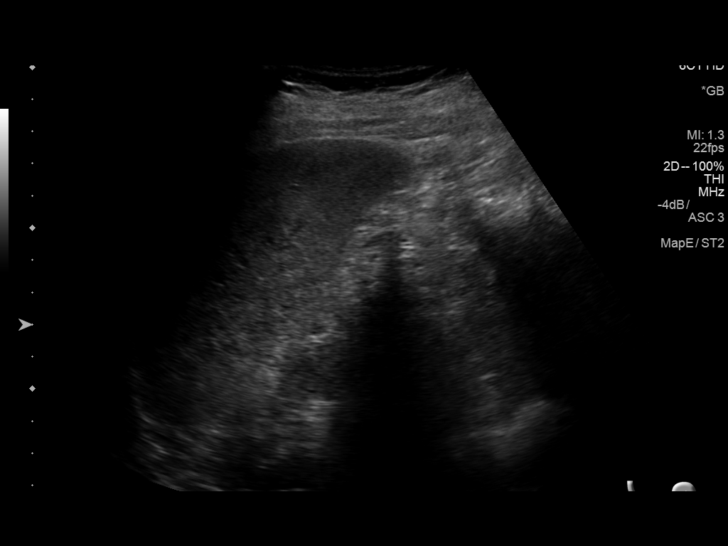
[im 14/53]
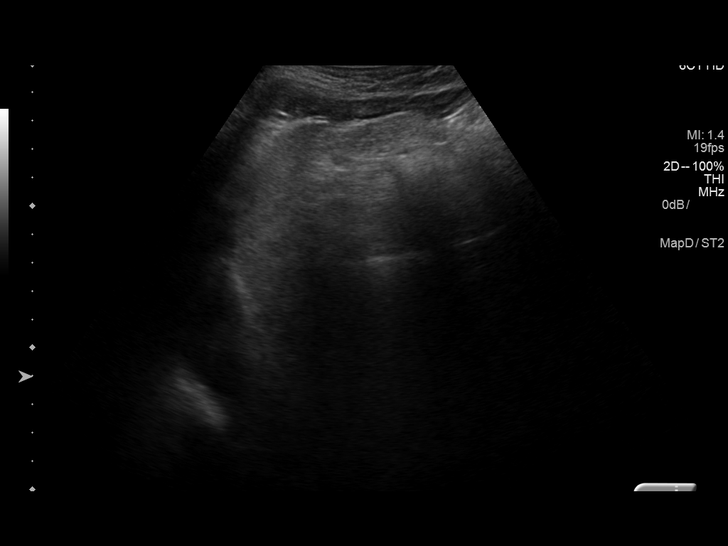
[im 18/53]
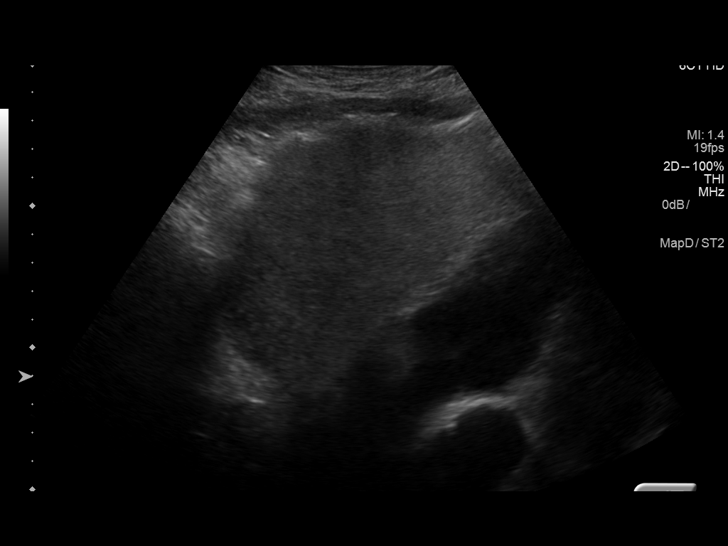
[im 20/53]
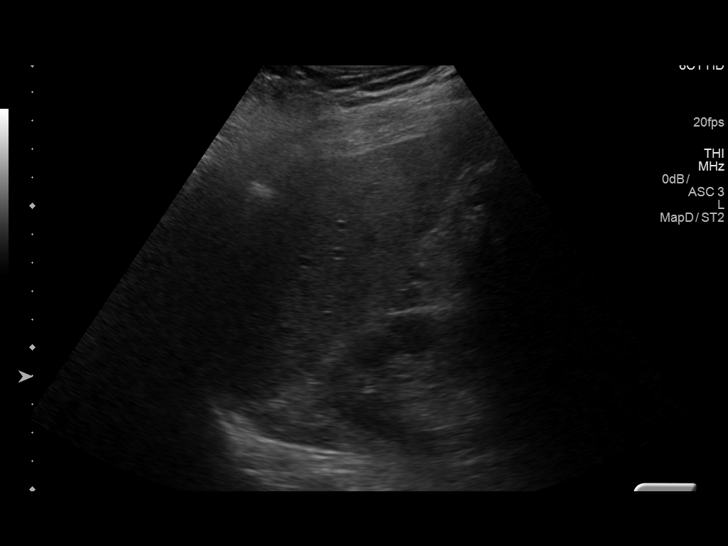
[im 24/53]
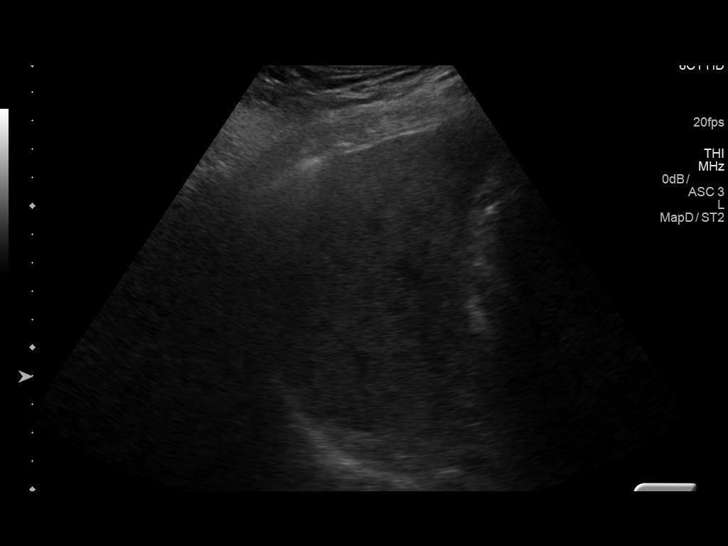
[im 29/53]
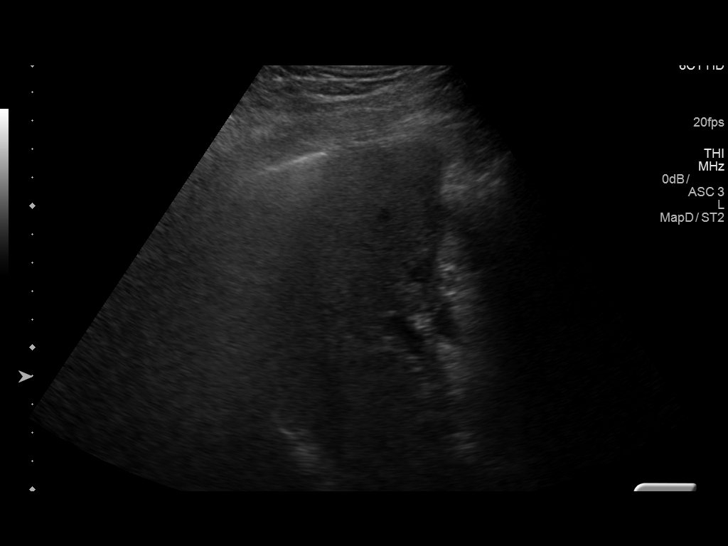
[im 33/53]
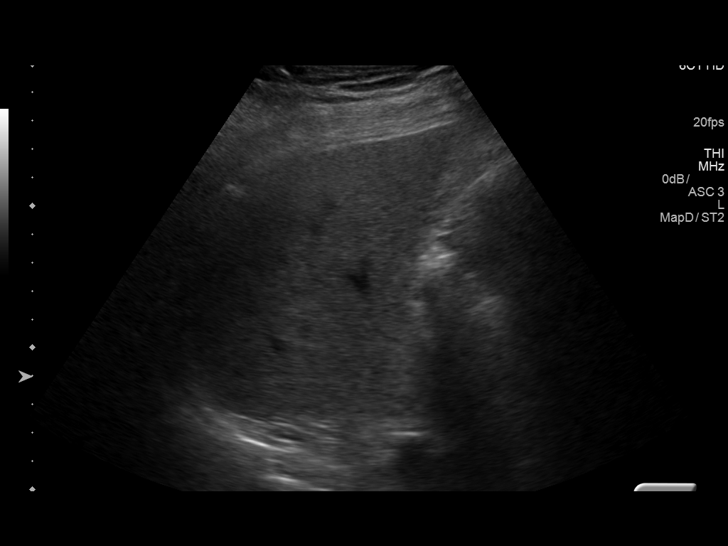
[im 35/53]
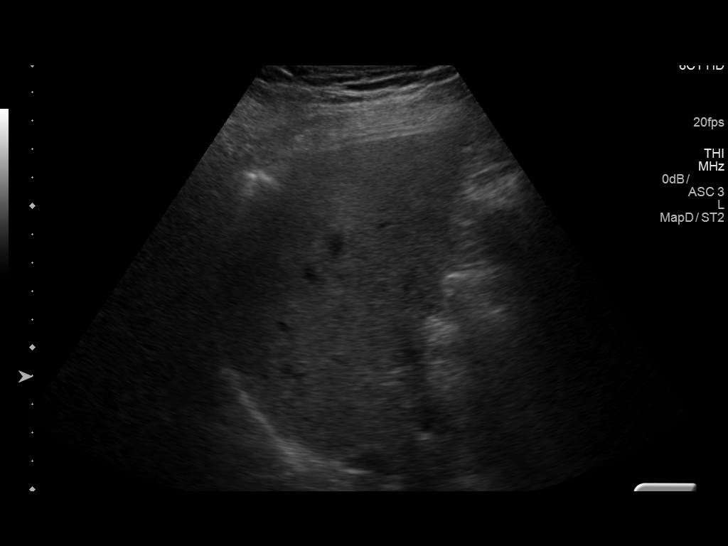
[im 40/53]
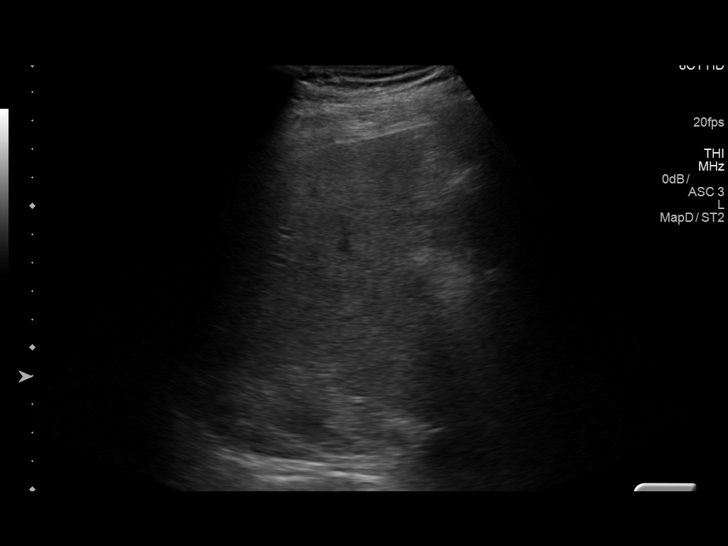
[im 44/53]
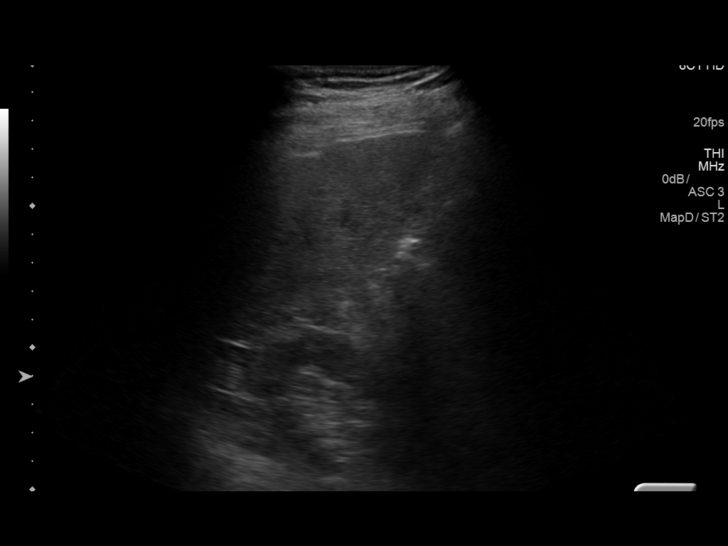
[im 48/53]
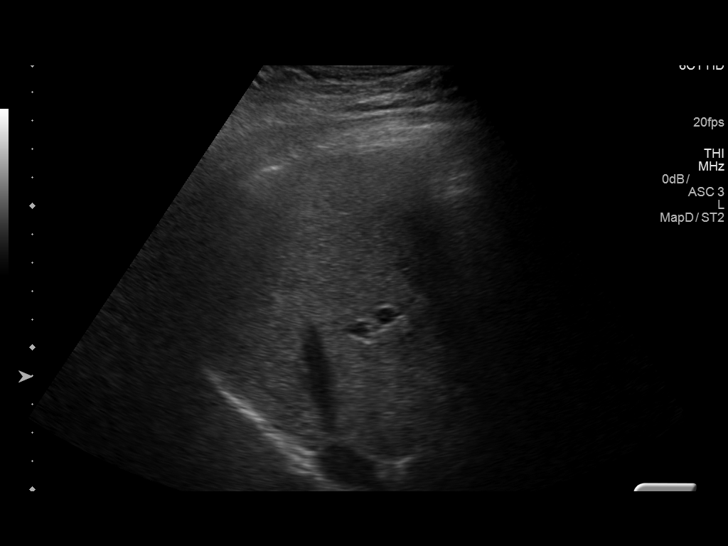
[im 53/53]
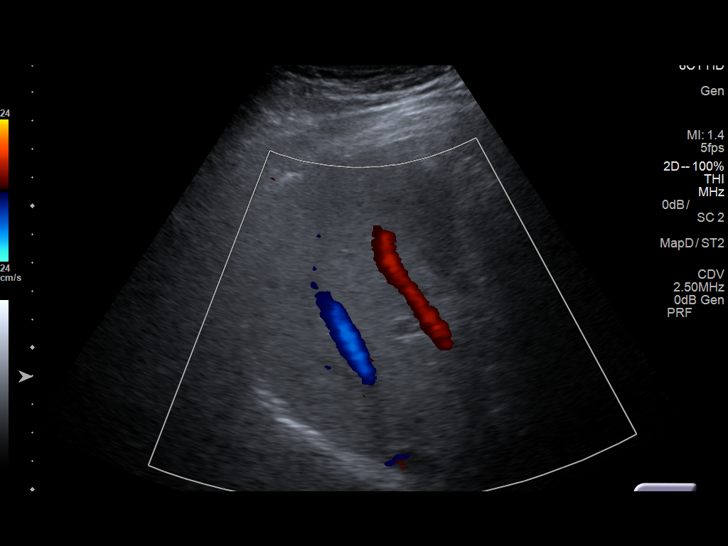

[14 of 25 positions shown; findings below may reference images not displayed]

FINDINGS: Gallbladder:

The gallbladder could not be visualized. There was no tenderness in
the right upper quadrant. There is a structure which may reflect a
contracted, stone-containing gallbladder with a wall echo shadow
contour. This could also reflect the duodenum however.

Common bile duct:

Diameter: 2.2 mm

Liver:

The hepatic echotexture is increased. There is no focal mass or
ductal dilation. The surface contour is smooth.
IMPRESSION: The gallbladder could not be discretely identified. Further
evaluation with contrast-enhanced abdominal CT scanning is
recommended.

Increased hepatic echotexture compatible with fatty infiltrative
change. Normal common bile duct.

## 2018-05-17 DIAGNOSIS — H11001 Unspecified pterygium of right eye: Secondary | ICD-10-CM | POA: Diagnosis not present

## 2018-05-17 DIAGNOSIS — T8522XA Displacement of intraocular lens, initial encounter: Secondary | ICD-10-CM | POA: Diagnosis not present

## 2018-05-17 DIAGNOSIS — H04123 Dry eye syndrome of bilateral lacrimal glands: Secondary | ICD-10-CM | POA: Diagnosis not present

## 2018-05-17 DIAGNOSIS — E119 Type 2 diabetes mellitus without complications: Secondary | ICD-10-CM | POA: Diagnosis not present

## 2018-05-18 DIAGNOSIS — H27131 Posterior dislocation of lens, right eye: Secondary | ICD-10-CM | POA: Diagnosis not present

## 2018-06-26 ENCOUNTER — Ambulatory Visit (INDEPENDENT_AMBULATORY_CARE_PROVIDER_SITE_OTHER): Payer: Medicare Other | Admitting: Family Medicine

## 2018-06-26 ENCOUNTER — Other Ambulatory Visit: Payer: Self-pay

## 2018-06-26 NOTE — Progress Notes (Signed)
Attempted to call the patient's 4 times and unable to contact, will have to reschedule   

## 2018-08-18 ENCOUNTER — Other Ambulatory Visit: Payer: Self-pay

## 2018-08-18 ENCOUNTER — Encounter: Payer: Self-pay | Admitting: Family Medicine

## 2018-08-18 ENCOUNTER — Ambulatory Visit (INDEPENDENT_AMBULATORY_CARE_PROVIDER_SITE_OTHER): Payer: Medicare Other | Admitting: Family Medicine

## 2018-08-18 DIAGNOSIS — I10 Essential (primary) hypertension: Secondary | ICD-10-CM

## 2018-08-18 DIAGNOSIS — I152 Hypertension secondary to endocrine disorders: Secondary | ICD-10-CM

## 2018-08-18 DIAGNOSIS — E1169 Type 2 diabetes mellitus with other specified complication: Secondary | ICD-10-CM | POA: Diagnosis not present

## 2018-08-18 DIAGNOSIS — E1159 Type 2 diabetes mellitus with other circulatory complications: Secondary | ICD-10-CM

## 2018-08-18 NOTE — Progress Notes (Addendum)
Virtual Visit via telephone Note  I connected with Roberto Miller on 08/18/18 at 1456 by telephone and verified that I am speaking with the correct person using two identifiers. Roberto Miller is currently located at home and no other people are currently with her during visit. The provider, [name] Edana Aguado, MD is located in their office at time of visit.  Call ended at 1501  I discussed the limitations, risks, security and privacy concerns of performing an evaluation and management service by telephone and the availability of in person appointments. I also discussed with the patient that there may be a patient responsible charge related to this service. The patient expressed understanding and agreed to proceed.   History and Present Illness: Type 2 diabetes mellitus Patient comes in today for recheck of his diabetes. Patient has been currently taking Janumet and glipizide and bs 135. Patient is currently on an ACE inhibitor/ARB. Patient has not seen an ophthalmologist this year. Patient denies any issues with their feet.   Hypertension Patient is currently on lisinopril and metoprolol, and their blood pressure today is unknown because he did not have a blood pressure cuff at home to check it with. Patient denies any lightheadedness or dizziness. Patient denies headaches, blurred vision, chest pains, shortness of breath, or weakness. Denies any side effects from medication and is content with current medication.   No diagnosis found.  Outpatient Encounter Medications as of 08/18/2018  Medication Sig   folic acid (FOLVITE) 1 MG tablet TAKE ONE (1) TABLET EACH DAY   glipiZIDE (GLUCOTROL) 10 MG tablet Take 1 tablet (10 mg total) by mouth 2 (two) times daily.   glucose blood (ONETOUCH VERIO) test strip Check blood sugar one time er day and prn   Dx E11.9   lisinopril (PRINIVIL,ZESTRIL) 5 MG tablet Take 2 tablets (10 mg total) by mouth daily.   metoprolol tartrate (LOPRESSOR) 25 MG  tablet TAKE two TABLETS TWICE DAILY   ONETOUCH DELICA LANCETS 33G MISC Check blood sugar 1x per day and prn   Dx E11.9   sitaGLIPtin-metformin (JANUMET) 50-1000 MG tablet TAKE ONE TABLET TWICE A DAY WITH FOOD   No facility-administered encounter medications on file as of 08/18/2018.     Review of Systems  Constitutional: Negative for chills and fever.  Respiratory: Negative for shortness of breath and wheezing.   Cardiovascular: Negative for chest pain and leg swelling.  Musculoskeletal: Negative for back pain and gait problem.  Skin: Negative for rash.  Neurological: Negative for dizziness, light-headedness and headaches.  All other systems reviewed and are negative.   Observations/Objective: Patient sounds comfortable and in no acute distress  Assessment and Plan: Problem List Items Addressed This Visit      Cardiovascular and Mediastinum   Hypertension associated with diabetes (HCC)     Endocrine   Diabetes (HCC) - Primary       Follow Up Instructions:  Follow-up in 3 months, his daughter will call and make the appointment   I discussed the assessment and treatment plan with the patient. The patient was provided an opportunity to ask questions and all were answered. The patient agreed with the plan and demonstrated an understanding of the instructions.   The patient was advised to call back or seek an in-person evaluation if the symptoms worsen or if the condition fails to improve as anticipated.  The above assessment and management plan was discussed with the patient. The patient verbalized understanding of and has agreed to the management plan. Patient  is aware to call the clinic if symptoms persist or worsen. Patient is aware when to return to the clinic for a follow-up visit. Patient educated on when it is appropriate to go to the emergency department.    I provided 5 minutes of non-face-to-face time during this encounter.    Nils Pyle, MD

## 2018-08-29 ENCOUNTER — Other Ambulatory Visit: Payer: Self-pay

## 2018-08-29 ENCOUNTER — Ambulatory Visit (INDEPENDENT_AMBULATORY_CARE_PROVIDER_SITE_OTHER): Payer: Medicare Other | Admitting: *Deleted

## 2018-08-29 VITALS — Ht 62.0 in | Wt 152.0 lb

## 2018-08-29 DIAGNOSIS — Z0000 Encounter for general adult medical examination without abnormal findings: Secondary | ICD-10-CM

## 2018-08-29 NOTE — Patient Instructions (Signed)
[name] , Thank you for taking time to come for your Medicare Wellness Visit. I appreciate your ongoing commitment to your health goals. Please review the following plan we discussed and let me know if I can assist you in the future.   These are the goals we discussed: Goals     "We need to help him take better care of his diabetes" (pt-stated)     Nurse Case Manager Clinical Goal(s): Over the next 30 days patient and/or healthcare proxy/daughter Roberto Miller(Roberto Miller) will verbalize understanding of need for longterm plan for management of diabetes.   Interventions: Face to face discussion with patient and healthcare proxy/daughter about rationale and need for plan of care for longterm management of diabetes; advised daughter to assist patient with CBG checks at least weekly. Provided patient/daughter with documentation tool and education about how to use appropriately for self health monitoring.       "We want to get a blood pressure monitor so we can check his blood pressure regularly" (pt-stated)     Nurse Case Manager Clinical Goal(s): Over the next 7 days, patient's healthcare proxy (dtr Roberto MartenVeronica Miller) will verbalize understanding of health plan coverage and options for obtaining electronic/digital bp monitor for home use   Interventions: discussed rationale and need for routine self health monitoring of bp with dtr/healthcare proxy who interpreted for patient/spouse; advised patient/proxy that CM team will investigate options and coverage for monitor         This is a list of the screening recommended for you and due dates:  Health Maintenance  Topic Date Due   Eye exam for diabetics  11/24/2016   Complete foot exam   08/09/2018   Hemoglobin A1C  09/05/2018   Flu Shot  10/21/2018   Tetanus Vaccine  11/18/2020   Pneumonia vaccines  Completed     Voluntades anticipadas Advance Directive  Las voluntades anticipadas son documentos legales que le permiten tomar  decisiones por adelantado acerca de su tratamiento mdico y atencin de la salud en caso de no poder expresarse usted mismo. Son la forma de comunicar sus deseos a familiares, amigos y mdicos. Esto puede ayudarlo a transmitir sus Affiliated Computer Servicesdecisiones sobre los cuidados terminales en caso de que no pueda expresarse. La elaboracin y la redaccin de las voluntades anticipadas deben realizarse con el transcurso del [place]iempo, Teacher, English as a foreign languageen lugar de tomar todas las decisiones a la vez. Las voluntades anticipadas pueden modificarse segn su situacin y sus deseos, incluso despus de haberlas firmado. Si no tiene un documento de voluntades anticipadas, algunos estados asignan familiares a cargo de la toma de decisiones para que acten en representacin suya segn cun cercana sea su relacin. Cada estado tiene sus propias leyes con respecto a las voluntades anticipadas. Recomendamos consultar a su mdico, su abogado o al representante estatal acerca de las leyes de su [place]estado. Hay diferentes tipos de voluntades anticipadas, por ejemplo:  Poder notarial mdico.  Testamento vital.  Orden de no reanimar (ONR) o de no Landscape architectintentar reanimar (ONIR). Apoderado para temas de salud y poder notarial mdico Un apoderado para temas de salud, tambin llamado representante de temas de Lilliesalud, es una persona designada para tomar decisiones relacionadas con la salud en representacin suya en aquellos [employer] Inccasos en los que usted no pueda tomar decisiones por usted mismo. Generalmente, la gente elige a personas a las que conoce bien y que son de confianza para representar sus preferencias. Asegrese de solicitar a esta persona un acuerdo para que acte como su apoderado. El apoderado  puede tener que evaluar por s mismo en el caso de una decisin mdica para la cual no se conocen los deseos del Study Buttepaciente. El poder notarial mdico es un documento legal que nombra a su apoderado para temas de Villanovasalud. En funcin de las leyes de su [place]estado, una vez escrito el  documento, es posible que tambin deba estar:  Sharilyn SitesFirmado.  Autenticado ante escribano.  Fechado.  Copiado.  Atestiguado.  Incorporado a la historia clnica del paciente. Recomendamos tambin asignar a alguien para que administre sus asuntos financieros en una situacin en la que no pueda hacerlo usted mismo. Esto se denomina poder notarial duradero para asuntos financieros. Este es un documento legal diferente del poder notarial duradero para asuntos de salud. Usted puede elegir a la misma persona o a Museum/gallery curatorotra diferente de la designada como representante de sus asuntos de salud para que acte como representante en los asuntos financieros. Si no asigna un apoderado, o si se considera que el apoderado no est actuando para bien del representado, podr Merchandiser, retaildesignarse un tutor legal para que acte en representacin suya. El testamento vital El testamento vital es un conjunto de indicaciones que documentan sus deseos acerca de la atencin mdica para el momento en que no pueda expresarlas usted mismo. Los mdicos deben conservar una copia del testamento vital en su historia clnica. Le recomendamos que entregue una copia a familiares o amigos. A fin de alertar a sus cuidadores en caso de una emergencia, puede colocar una tarjeta en la billetera que indique que tiene un testamento vital y dnde Surveyor, miningencontrarlo. El testamento vital se [place]ots[place]ausa si:  Tiene una enfermedad terminal.  Est incapacitado.  No puede comunicarse ni tomar decisiones. Los puntos a Hotel managerconsiderar en el testamento vital incluyen los siguientes:  El uso o no uso de equipos para Pharmacologistmantener la vida, como dispositivos de dilisis y ventiladores (respiradores).  Una orden ONR u ONIR, que es la indicacin de no Teaching laboratory technicianhacer resucitacin cardiopulmonar (RCP) si la respiracin o los latidos cardacos se detienen.  El Zapata Ranchuso o no uso de la alimentacin por sonda.  La suspensin de alimentos y lquidos.  La atencin de Bloomingtonalivio (cuidados paliativos) cuando el  objetivo es la comodidad ms que la cura.  La donacin de rganos y tejidos. El testamento vital no da indicaciones acerca de la distribucin del dinero ni las propiedades en caso de fallecimiento. Se recomienda buscar asesoramiento de un abogado al redactar un testamento. Las Universal Healthdecisiones sobre impuestos, beneficiarios y distribucin de los activos sern legalmente vinculantes. Este proceso puede Paramedicaliviar a sus familiares y Personnel officeramigos de las preocupaciones respecto de los conflictos o las cuestiones relacionadas con la distribucin de sus bienes. ONR u ONIR La orden de no reanimar (ONR) es el pedido de no Engineer, waterrealizar resucitacin cardiopulmonar (RCP) en el caso de que el corazn o la respiracin se Insurance claims handlerdetengan. Si no se realiz ni se present Rockwell Automationuna orden ONR u ONIR, el mdico intentar ayudar a cualquier paciente cuyo corazn o respiracin se detengan. Si planea someterse a [place] and Herzegovinauna ciruga, hable con su mdico sobre el cumplimiento de su ONR u ONIR en caso de que surjan problemas. Resumen  Las voluntades anticipadas son documentos legales que le permiten tomar decisiones por adelantado acerca de su tratamiento mdico y atencin de la salud en caso de no poder expresarse usted mismo.  El proceso de Event organiserelaborar y Armed forces training and education officerredactar las voluntades anticipadas debe realizarse con el transcurso del [place] Forktiempo. Puede modificar las voluntades anticipadas, incluso despus de haberlas firmado.  Las voluntades anticipadas incluyen  las rdenes ONR u ONIR, los testamentos vitales y la designacin de un representante como su apoderado. Esta informacin no tiene Marine scientist el consejo del mdico. Asegrese de hacerle al mdico cualquier pregunta que tenga. Document Released: 11/08/2012 Document Revised: 08/31/2016 Document Reviewed: 08/31/2016 Elsevier Interactive Patient Education  [employer].

## 2018-08-29 NOTE — Progress Notes (Addendum)
MEDICARE ANNUAL WELLNESS VISIT  08/29/2018  Telephone Visit Disclaimer This Medicare AWV was conducted by telephone due to national recommendations for restrictions regarding the COVID-19 Pandemic (e.g. social distancing).  I verified, using two identifiers, that I am speaking with Roberto Miller or their authorized healthcare agent. I discussed the limitations, risks, security, and privacy concerns of performing an evaluation and management service by telephone and the potential availability of an in-person appointment in the future. The patient expressed understanding and agreed to proceed.   Subjective:  Roberto Miller is a 85 y.o. male patient of [name], [name], MD who had a Medicare Annual Wellness Visit today via telephone. Roberto Miller is Retired and lives with their family. he has 9 children. he reports that he is socially active and does interact with friends/family regularly. he is not physically active and enjoys gardening.  Patient Care Team: [name], [name], MD as PCP - General (Family Medicine) [name], [name], RN as Case Manager  Advanced Directives 08/29/2018 04/01/2014  Does Patient Have a Medical Advance Directive? Yes No  Type of Advance Directive Living will;Healthcare Power of Attorney -  Does patient want to make changes to medical advance directive? No - Patient declined -  Copy of Healthcare Power of Attorney in Chart? No - copy requested -  Would patient like information on creating a medical advance directive? - Yes - Educational materials given    Hospital Utilization Over the Past 12 Months: # of hospitalizations or ER visits: 0 # of surgeries: 0  Review of Systems    Patient reports that his overall health is unchanged compared to last year.  Patient Reported Readings (BP, Pulse, CBG, Weight, etc) CBG 147  Review of Systems: History obtained from chart review and the patient General ROS: negative  All other systems negative.  Pain  Assessment Pain : No/denies pain     Current Medications & Allergies (verified) Allergies as of 08/29/2018   No Known Allergies     Medication List       Accurate as of August 29, 2018  2:52 PM. If you have any questions, ask your nurse or doctor.        folic acid 1 MG tablet Commonly known as:  FOLVITE TAKE ONE (1) TABLET EACH DAY   glipiZIDE 10 MG tablet Commonly known as:  GLUCOTROL Take 1 tablet (10 mg total) by mouth 2 (two) times daily.   glucose blood test strip Commonly known as:  OneTouch Verio Check blood sugar one time er day and prn   Dx E11.9   lisinopril 5 MG tablet Commonly known as:  ZESTRIL Take 2 tablets (10 mg total) by mouth daily.   metoprolol tartrate 25 MG tablet Commonly known as:  LOPRESSOR TAKE two TABLETS TWICE DAILY   OneTouch Delica Lancets 33G Misc Check blood sugar 1x per day and prn   Dx E11.9   sitaGLIPtin-metformin 50-1000 MG tablet Commonly known as:  Janumet TAKE ONE TABLET TWICE A DAY WITH FOOD       History (reviewed): Past Medical History:  Diagnosis Date  . Diabetes mellitus without complication (HCC)   . Hyperlipidemia   . Hypertension    History reviewed. No pertinent surgical history. Family History  Problem Relation Age of Onset  . Diabetes Sister   . Diabetes Brother   . Diabetes Sister    Social History   Socioeconomic History  . Marital status: Married    Spouse name: [name]  . Number of  children: 9  . Years of education: Not on file  . Highest education level: Not on file  Occupational History  . Occupation: retired  Engineer, productionocial Needs  . Financial resource strain: Not hard at all  . Food insecurity:    Worry: Never true    Inability: Never true  . Transportation needs:    Medical: No    Non-medical: No  Tobacco Use  . Smoking status: Never Smoker  . Smokeless tobacco: Never Used  Substance and Sexual Activity  . Alcohol use: No  . Drug use: No  . Sexual activity: Never  Lifestyle  .  Physical activity:    Days per week: 7 days    Minutes per session: 30 min  . Stress: Not at all  Relationships  . Social connections:    Talks on phone: More than three times a week    Gets together: More than three times a week    Attends religious service: More than 4 times per year    Active member of club or organization: Yes    Attends meetings of clubs or organizations: More than 4 times per year    Relationship status: Married  Other Topics Concern  . Not on file  Social History Narrative  . Not on file    Activities of Daily Living In your present state of health, do you have any difficulty performing the following activities: 08/29/2018  Hearing? N  Vision? Y  Difficulty concentrating or making decisions? N  Walking or climbing stairs? Y  Dressing or bathing? N  Doing errands, shopping? Y  Preparing Food and eating ? N  Using the Toilet? N  In the past six months, have you accidently leaked urine? N  Do you have problems with loss of bowel control? N  Managing your Medications? N  Managing your Finances? N  Housekeeping or managing your Housekeeping? N  Some recent data might be hidden    Patient Literacy How often do you need to have someone help you when you read instructions, pamphlets, or other written materials from your doctor or pharmacy?: 5 - Always  Exercise Current Exercise Habits: The patient does not participate in regular exercise at present, Exercise limited by: orthopedic condition(s)  Diet Patient reports consuming 2 meals a day and 2 snack(s) a day Patient reports that his primary diet is: Regular Patient reports that she does have regular access to food.   Depression Screen PHQ 2/9 Scores 08/29/2018 03/06/2018 11/08/2017 09/05/2017 09/05/2017 08/08/2017 05/11/2017  PHQ - 2 Score 0 0 0 0 0 0 0     Fall Risk Fall Risk  08/29/2018 03/06/2018 11/08/2017 09/05/2017 09/05/2017  Falls in the past year? 0 0 No No No  Number falls in past yr: 0 - - - -   Injury with Fall? - - - - -  Risk for fall due to : - - - - -  Follow up - - - - -     Objective:  Roberto Miller seemed alert and oriented and he participated appropriately during our telephone visit.  Blood Pressure Weight BMI  BP Readings from Last 3 Encounters:  03/06/18 116/66  11/08/17 100/64  09/05/17 127/70   Wt Readings from Last 3 Encounters:  08/29/18 152 lb (68.9 kg)  03/06/18 152 lb 3.2 oz (69 kg)  11/08/17 154 lb 12.8 oz (70.2 kg)   BMI Readings from Last 1 Encounters:  08/29/18 27.80 kg/m    *Unable to obtain current vital signs,  weight, and BMI due to telephone visit type  Hearing/Vision  . [place] did not seem to have difficulty with hearing/understanding during the telephone conversation . Reports that he has not had a formal eye exam by an eye care professional within the past year . Reports that he has not had a formal hearing evaluation within the past year *Unable to fully assess hearing and vision during telephone visit type  Cognitive Function: 6CIT Screen 08/29/2018  What Year? 0 points  What month? 0 points  What time? 0 points  Count back from 20 0 points  Months in reverse 0 points  Repeat phrase 0 points  Total Score 0    Normal Cognitive Function Screening: Yes (Normal:0-7, Significant for Dysfunction: >8)  Immunization & Health Maintenance Record Immunization History  Administered Date(s) Administered  . Influenza, High Dose Seasonal PF 03/06/2018  . Influenza,inj,Quad PF,6+ Mos 12/15/2012, 12/27/2013, 01/22/2016  . Pneumococcal Conjugate-13 07/04/2014  . Pneumococcal Polysaccharide-23 11/19/2010  . Tdap 09/19/2010    Health Maintenance  Topic Date Due  . OPHTHALMOLOGY EXAM  11/24/2016  . FOOT EXAM  08/09/2018  . HEMOGLOBIN A1C  09/05/2018  . INFLUENZA VACCINE  10/21/2018  . TETANUS/TDAP  11/18/2020  . PNA vac Low Risk Adult  Completed       Assessment  This is a routine wellness examination for [name].   Health Maintenance: Due or Overdue Health Maintenance Due  Topic Date Due  . OPHTHALMOLOGY EXAM  11/24/2016  . FOOT EXAM  08/09/2018    [name] does not need a referral for Community Assistance: Care Management:   no Social Work:    no Prescription Assistance:  no Nutrition/Diabetes Education:  no   Plan:  Personalized Goals Goals Addressed   None    Personalized Health Maintenance & Screening Recommendations  Diabetes screening Glaucoma screening  Lung Cancer Screening Recommended: no (Low Dose CT Chest recommended if Age 109-80 years, 30 pack-year currently smoking OR have quit w/in past 15 years) Hepatitis C Screening recommended: no HIV Screening recommended: no  Advanced Directives: Written information was prepared per patient's request.  Referrals & Orders No orders of the defined types were placed in this encounter.   Follow-up Plan . Follow-up with [name], Fransisca Kaufmann, MD as planned   I have personally reviewed and noted the following in the patient's chart:   . Medical and social history . Use of alcohol, tobacco or illicit drugs  . Current medications and supplements . Functional ability and status . Nutritional status . Physical activity . Advanced directives . List of other physicians . Hospitalizations, surgeries, and ER visits in previous 12 months . Vitals . Screenings to include cognitive, depression, and falls . Referrals and appointments  In addition, I have reviewed and discussed with [name] certain preventive protocols, quality metrics, and best practice recommendations. A written personalized care plan for preventive services as well as general preventive health recommendations is available and can be mailed to the patient at his request.      Wardell Heath  08/29/2018   I have reviewed and agree with the above AWV documentation.   Evelina Dun, FNP

## 2018-09-04 ENCOUNTER — Ambulatory Visit: Payer: Medicare Other

## 2018-09-04 ENCOUNTER — Other Ambulatory Visit: Payer: Self-pay

## 2018-09-05 NOTE — Chronic Care Management (AMB) (Signed)
  Chronic Care Management   Telephone Follow Up Note   09/04/2018 Name: Tavien Chestnut MRN: [id number] DOB: 1932/03/20  Referred by: [name], Fransisca Kaufmann, MD Reason for referral : Chronic Care Management (RNCM telephone follow up)   [name] is a 85 y.o. year old male who is a primary care patient of [name], Fransisca Kaufmann, MD. The CCM team was consulted for assistance with chronic disease management and care coordination needs.    Mr Limas was offered CCM services when the program was just beginning and he had fallen off of my list. I expect that he still needs CCM. I placed a follow-up telephone call to Mr. Blasius today but I was unable to speak with him.    Follow Up Plan The care management team will reach out to the patient again over the next 14 days. Outreach to his daughter will probably be the best course of action.    Chong Sicilian, RN-BC, BSN Nurse Care Manager [facility] [phone]

## 2018-09-22 DIAGNOSIS — H35362 Drusen (degenerative) of macula, left eye: Secondary | ICD-10-CM | POA: Diagnosis not present

## 2018-09-22 DIAGNOSIS — H43391 Other vitreous opacities, right eye: Secondary | ICD-10-CM | POA: Diagnosis not present

## 2018-09-22 DIAGNOSIS — H53131 Sudden visual loss, right eye: Secondary | ICD-10-CM | POA: Diagnosis not present

## 2018-09-22 DIAGNOSIS — H40003 Preglaucoma, unspecified, bilateral: Secondary | ICD-10-CM | POA: Diagnosis not present

## 2018-09-22 DIAGNOSIS — H5319 Other subjective visual disturbances: Secondary | ICD-10-CM | POA: Diagnosis not present

## 2018-09-22 DIAGNOSIS — T8522XA Displacement of intraocular lens, initial encounter: Secondary | ICD-10-CM | POA: Diagnosis not present

## 2018-09-28 DIAGNOSIS — H43391 Other vitreous opacities, right eye: Secondary | ICD-10-CM | POA: Diagnosis not present

## 2018-09-28 DIAGNOSIS — T8522XA Displacement of intraocular lens, initial encounter: Secondary | ICD-10-CM | POA: Diagnosis not present

## 2018-10-03 ENCOUNTER — Other Ambulatory Visit: Payer: Self-pay | Admitting: Family Medicine

## 2018-10-30 DIAGNOSIS — H35363 Drusen (degenerative) of macula, bilateral: Secondary | ICD-10-CM | POA: Diagnosis not present

## 2019-01-08 DIAGNOSIS — H35033 Hypertensive retinopathy, bilateral: Secondary | ICD-10-CM | POA: Diagnosis not present

## 2019-01-08 DIAGNOSIS — H35363 Drusen (degenerative) of macula, bilateral: Secondary | ICD-10-CM | POA: Diagnosis not present

## 2019-01-08 DIAGNOSIS — Z961 Presence of intraocular lens: Secondary | ICD-10-CM | POA: Diagnosis not present

## 2019-01-08 DIAGNOSIS — H40003 Preglaucoma, unspecified, bilateral: Secondary | ICD-10-CM | POA: Diagnosis not present

## 2019-01-08 DIAGNOSIS — H11153 Pinguecula, bilateral: Secondary | ICD-10-CM | POA: Diagnosis not present

## 2019-01-10 ENCOUNTER — Ambulatory Visit (INDEPENDENT_AMBULATORY_CARE_PROVIDER_SITE_OTHER): Payer: Medicare Other | Admitting: Family Medicine

## 2019-01-10 ENCOUNTER — Encounter: Payer: Self-pay | Admitting: Family Medicine

## 2019-01-10 VITALS — BP 110/66 | HR 57 | Temp 98.0°F | Ht 62.0 in | Wt 153.4 lb

## 2019-01-10 DIAGNOSIS — E1159 Type 2 diabetes mellitus with other circulatory complications: Secondary | ICD-10-CM | POA: Diagnosis not present

## 2019-01-10 DIAGNOSIS — Z6830 Body mass index (BMI) 30.0-30.9, adult: Secondary | ICD-10-CM

## 2019-01-10 DIAGNOSIS — H60331 Swimmer's ear, right ear: Secondary | ICD-10-CM

## 2019-01-10 DIAGNOSIS — I152 Hypertension secondary to endocrine disorders: Secondary | ICD-10-CM

## 2019-01-10 DIAGNOSIS — L819 Disorder of pigmentation, unspecified: Secondary | ICD-10-CM

## 2019-01-10 DIAGNOSIS — E119 Type 2 diabetes mellitus without complications: Secondary | ICD-10-CM | POA: Diagnosis not present

## 2019-01-10 DIAGNOSIS — E1169 Type 2 diabetes mellitus with other specified complication: Secondary | ICD-10-CM

## 2019-01-10 DIAGNOSIS — Z23 Encounter for immunization: Secondary | ICD-10-CM | POA: Diagnosis not present

## 2019-01-10 DIAGNOSIS — I10 Essential (primary) hypertension: Secondary | ICD-10-CM

## 2019-01-10 LAB — BAYER DCA HB A1C WAIVED: HB A1C (BAYER DCA - WAIVED): 7.7 % — ABNORMAL HIGH (ref <7.0)

## 2019-01-10 MED ORDER — LISINOPRIL 5 MG PO TABS: 10.0000 mg | ORAL_TABLET | Freq: Every day | ORAL | 3 refills | Status: DC

## 2019-01-10 MED ORDER — JANUMET 50-1000 MG PO TABS: ORAL_TABLET | ORAL | 3 refills | Status: DC

## 2019-01-10 MED ORDER — NEOMYCIN-POLYMYXIN-HC 3.5-10000-1 OT SOLN: 3.0000 [drp] | Freq: Four times a day (QID) | OTIC | 0 refills | Status: AC

## 2019-01-10 MED ORDER — GLIPIZIDE 10 MG PO TABS: 10.0000 mg | ORAL_TABLET | Freq: Two times a day (BID) | ORAL | 3 refills | Status: DC

## 2019-01-10 MED ORDER — METOPROLOL TARTRATE 25 MG PO TABS: ORAL_TABLET | ORAL | 1 refills | Status: DC

## 2019-01-10 NOTE — Progress Notes (Signed)
BP 110/66   Pulse (!) 57   Temp 98 F (36.7 C) (Temporal)   Ht _0  (1.575 m)   Wt 153 lb 6.4 oz (69.6 kg)   SpO2 98%   BMI 28.06 kg/m    Subjective:   Patient ID: Roberto Miller, male    DOB: Apr 11, 1931, 85 y.o.   MRN: [id number]  HPI: Roberto Miller is a 85 y.o. male presenting on 01/10/2019 for Diabetes (3 mth follow up) and Ear Pain (right x 2 weels- Patient states that he feels like something is in his ear)   HPI Type 2 diabetes mellitus Patient comes in today for recheck of his diabetes. Patient has been currently taking lipid side and Janumet, his blood sugars are up more than they had been recently because of being at home and the Covid. Patient is currently on an ACE inhibitor/ARB. Patient has not seen an ophthalmologist this year. Patient denies any issues with their feet.   Hypertension Patient is currently on lisinopril and metoprolol, and their blood pressure today is 110/66. Patient denies any lightheadedness or dizziness. Patient denies headaches, blurred vision, chest pains, shortness of breath, or weakness. Denies any side effects from medication and is content with current medication.   Patient complains of some pressure in his ear feels like something is in his ear its been going on for 2 weeks on his right ear.  He denies any drainage or fevers or chills or congestion or nothing else but mainly his get that pressure in his ear.  Has a large skin lesion on his left foot on the top he says been there for a while and he was not concerned about it but looking at it on her exam we would like him to go see dermatology.  Relevant past medical, surgical, family and social history reviewed and updated as indicated. Interim medical history since our last visit reviewed. Allergies and medications reviewed and updated.  Review of Systems  Constitutional: Negative for chills and fever.  HENT: Positive for ear pain. Negative for congestion, ear discharge, sinus pressure  and sore throat.   Respiratory: Negative for shortness of breath and wheezing.   Cardiovascular: Negative for chest pain and leg swelling.  Musculoskeletal: Negative for back pain and gait problem.  Skin: Negative for rash.  All other systems reviewed and are negative.   Per HPI unless specifically indicated above   Allergies as of 01/10/2019   No Known Allergies     Medication List       Accurate as of January 10, 2019  2:21 PM. If you have any questions, ask your nurse or doctor.        folic acid 1 MG tablet Commonly known as: FOLVITE TAKE ONE (1) TABLET EACH DAY   glipiZIDE 10 MG tablet Commonly known as: GLUCOTROL Take 1 tablet (10 mg total) by mouth 2 (two) times daily.   glucose blood test strip Commonly known as: OneTouch Verio Check blood sugar one time er day and prn   Dx E11.9   Janumet 50-1000 MG tablet Generic drug: sitaGLIPtin-metformin TAKE ONE TABLET TWICE A DAY WITH FOOD   lisinopril 5 MG tablet Commonly known as: ZESTRIL Take 2 tablets (10 mg total) by mouth daily.   metoprolol tartrate 25 MG tablet Commonly known as: LOPRESSOR TAKE THREE TABLETS TWICE DAILY   neomycin-polymyxin-hydrocortisone OTIC solution Commonly known as: CORTISPORIN Place 3 drops into the right ear 4 (four) times daily for 7 days. Started by: Fransisca Kaufmann  Mack Alvidrez, MD   OneTouch Delica Lancets 71I Misc Check blood sugar 1x per day and prn   Dx E11.9        Objective:   BP 110/66   Pulse (!) 57   Temp 98 F (36.7 C) (Temporal)   Ht _0  (1.575 m)   Wt 153 lb 6.4 oz (69.6 kg)   SpO2 98%   BMI 28.06 kg/m   Wt Readings from Last 3 Encounters:  01/10/19 153 lb 6.4 oz (69.6 kg)  08/29/18 152 lb (68.9 kg)  03/06/18 152 lb 3.2 oz (69 kg)    Physical Exam Vitals signs and nursing note reviewed.  Constitutional:      General: He is not in acute distress.    Appearance: He is well-developed. He is not diaphoretic.  HENT:     Right Ear: Drainage and swelling  present. No middle ear effusion. There is no impacted cerumen. Tympanic membrane is not injected or bulging.     Left Ear: Tympanic membrane normal.  Eyes:     General: No scleral icterus.    Conjunctiva/sclera: Conjunctivae normal.  Neck:     Musculoskeletal: Neck supple.     Thyroid: No thyromegaly.  Cardiovascular:     Rate and Rhythm: Normal rate and regular rhythm.     Heart sounds: Normal heart sounds. No murmur.  Pulmonary:     Effort: Pulmonary effort is normal. No respiratory distress.     Breath sounds: Normal breath sounds. No wheezing.  Musculoskeletal: Normal range of motion.  Lymphadenopathy:     Cervical: No cervical adenopathy.  Skin:    General: Skin is warm and dry.     Findings: Lesion (2 x 3 cm pigmented lesion on top of left foot) present. No rash.  Neurological:     Mental Status: He is alert and oriented to person, place, and time.     Coordination: Coordination normal.  Psychiatric:        Behavior: Behavior normal.       Assessment & Plan:   Problem List Items Addressed This Visit      Cardiovascular and Mediastinum   Hypertension associated with diabetes (HCC)   Relevant Medications   sitaGLIPtin-metformin (JANUMET) 50-1000 MG tablet   lisinopril (ZESTRIL) 5 MG tablet   metoprolol tartrate (LOPRESSOR) 25 MG tablet   glipiZIDE (GLUCOTROL) 10 MG tablet   Other Relevant Orders   CBC with Differential/Platelet (Completed)   CMP14+EGFR (Completed)   Lipid panel (Completed)     Endocrine   Diabetes ([place]) - Primary   Relevant Medications   sitaGLIPtin-metformin (JANUMET) 50-1000 MG tablet   lisinopril (ZESTRIL) 5 MG tablet   glipiZIDE (GLUCOTROL) 10 MG tablet   Other Relevant Orders   CBC with Differential/Platelet (Completed)   CMP14+EGFR (Completed)   Lipid panel (Completed)   Bayer DCA Hb A1c Waived (Completed)   CBC with Differential/Platelet (Completed)   CMP14+EGFR (Completed)   Lipid panel (Completed)     Other   BMI  30.0-30.9,adult   Relevant Orders   CMP14+EGFR (Completed)   Lipid panel (Completed)    Other Visit Diagnoses    Need for immunization against influenza       Relevant Orders   Flu Vaccine QUAD High Dose(Fluad) (Completed)   Acute swimmer's ear of right side       Atypical pigmented skin lesion       On top of left foot, 2-1/2 x 3 cm   Relevant Orders   Ambulatory referral to  Dermatology      A1c 7.7, will change diet rather than medicine and they will return in 3 months  Treatment right ear like otitis externa and given drops and see if it does not improve based on exam  Skin lesion on top of left foot is concerning and will send to dermatology Follow up plan: Return in about 3 months (around 04/12/2019), or if symptoms worsen or fail to improve.  Counseling provided for all of the vaccine components Orders Placed This Encounter  Procedures  . Flu Vaccine QUAD High Dose(Fluad)  . Bayer DCA Hb A1c Waived  . CBC with Differential/Platelet  . CMP14+EGFR  . Lipid panel  . Ambulatory referral to Dermatology    Caryl Pina, MD [facility] 01/10/2019, 2:21 PM

## 2019-01-11 LAB — CBC WITH DIFFERENTIAL/PLATELET
Basophils Absolute: 0 10*3/uL (ref 0.0–0.2)
Basos: 1 %
EOS (ABSOLUTE): 0.1 10*3/uL (ref 0.0–0.4)
Eos: 2 %
Hematocrit: 33 % — ABNORMAL LOW (ref 37.5–51.0)
Hemoglobin: 11.2 g/dL — ABNORMAL LOW (ref 13.0–17.7)
Immature Grans (Abs): 0 10*3/uL (ref 0.0–0.1)
Immature Granulocytes: 1 %
Lymphocytes Absolute: 1 10*3/uL (ref 0.7–3.1)
Lymphs: 26 %
MCH: 29.8 pg (ref 26.6–33.0)
MCHC: 33.9 g/dL (ref 31.5–35.7)
MCV: 88 fL (ref 79–97)
Monocytes Absolute: 0.4 10*3/uL (ref 0.1–0.9)
Monocytes: 10 %
Neutrophils Absolute: 2.2 10*3/uL (ref 1.4–7.0)
Neutrophils: 60 %
Platelets: 177 10*3/uL (ref 150–450)
RBC: 3.76 x10E6/uL — ABNORMAL LOW (ref 4.14–5.80)
RDW: 13 % (ref 11.6–15.4)
WBC: 3.7 10*3/uL (ref 3.4–10.8)

## 2019-01-11 LAB — CMP14+EGFR
ALT: 12 IU/L (ref 0–44)
AST: 19 IU/L (ref 0–40)
Albumin/Globulin Ratio: 1.5 (ref 1.2–2.2)
Albumin: 4.2 g/dL (ref 3.6–4.6)
Alkaline Phosphatase: 91 IU/L (ref 39–117)
BUN/Creatinine Ratio: 21 (ref 10–24)
BUN: 21 mg/dL (ref 8–27)
Bilirubin Total: 0.3 mg/dL (ref 0.0–1.2)
CO2: 22 mmol/L (ref 20–29)
Calcium: 9.2 mg/dL (ref 8.6–10.2)
Chloride: 103 mmol/L (ref 96–106)
Creatinine, Ser: 0.98 mg/dL (ref 0.76–1.27)
GFR calc Af Amer: 81 mL/min/{1.73_m2} (ref >59)
GFR calc non Af Amer: 70 mL/min/{1.73_m2} (ref >59)
Globulin, Total: 2.8 g/dL (ref 1.5–4.5)
Glucose: 207 mg/dL — ABNORMAL HIGH (ref 65–99)
Potassium: 4.8 mmol/L (ref 3.5–5.2)
Sodium: 136 mmol/L (ref 134–144)
Total Protein: 7 g/dL (ref 6.0–8.5)

## 2019-01-11 LAB — LIPID PANEL
Chol/HDL Ratio: 3.6 ratio (ref 0.0–5.0)
Cholesterol, Total: 156 mg/dL (ref 100–199)
HDL: 43 mg/dL (ref >39)
LDL Chol Calc (NIH): 87 mg/dL (ref 0–99)
Triglycerides: 151 mg/dL — ABNORMAL HIGH (ref 0–149)
VLDL Cholesterol Cal: 26 mg/dL (ref 5–40)

## 2019-01-29 DIAGNOSIS — L280 Lichen simplex chronicus: Secondary | ICD-10-CM | POA: Diagnosis not present

## 2019-01-29 DIAGNOSIS — L820 Inflamed seborrheic keratosis: Secondary | ICD-10-CM | POA: Diagnosis not present

## 2019-02-07 IMAGING — CT CT ABD-PELV W/ CM
2 of 5 series · 16 of 46 positions shown, 18 images · IV contrast (Isovue)
Comparison: None.

CLINICAL DATA: Periumbilical pain and fever since yesterday.
History of pancreatitis.

EXAM:
CT ABDOMEN AND PELVIS WITH CONTRAST
TECHNIQUE: Multidetector CT imaging of the abdomen and pelvis was performed
using the standard protocol following bolus administration of
intravenous contrast.
CONTRAST:  100 ml DDI7F0-FWW IOPAMIDOL ([id number]-[id number]) INJECTION 61%

[Series 2: axial st · axial · 0.73mm/px · z∈[+1066,+1476]mm · 13 of 94 slices shown, 15 images]
[im 6/94  soft-tissue]
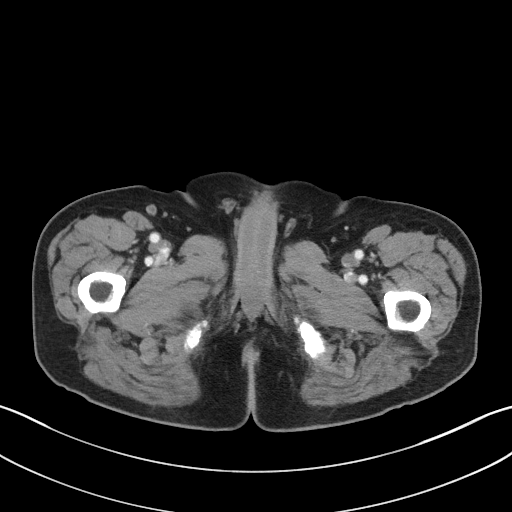
[im 6/94  bone]
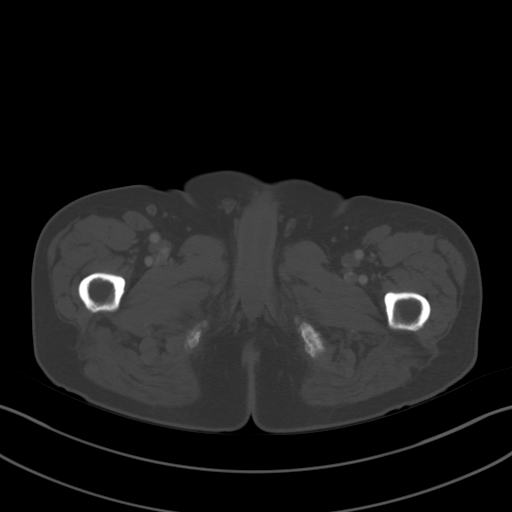
[im 11/94  soft-tissue]
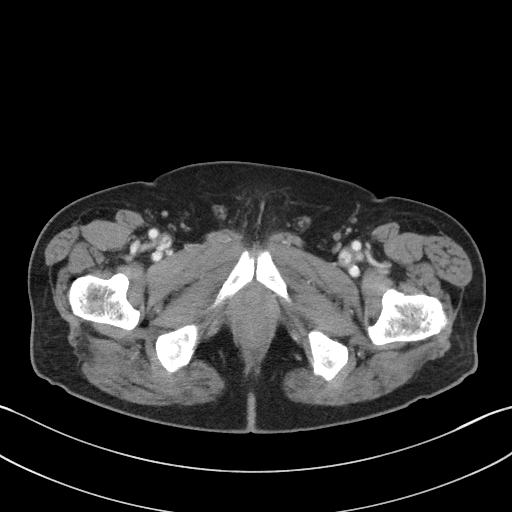
[im 21/94  soft-tissue]
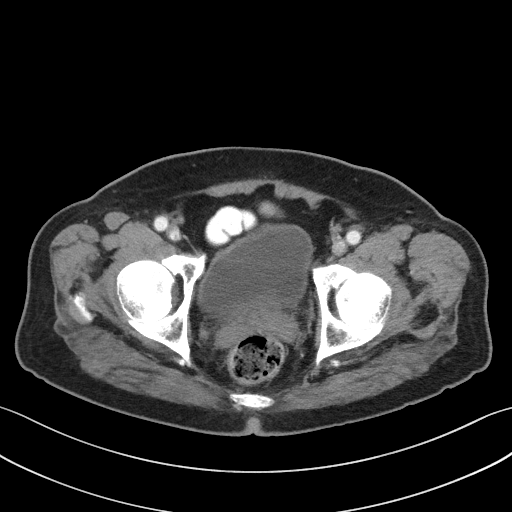
[im 26/94  soft-tissue]
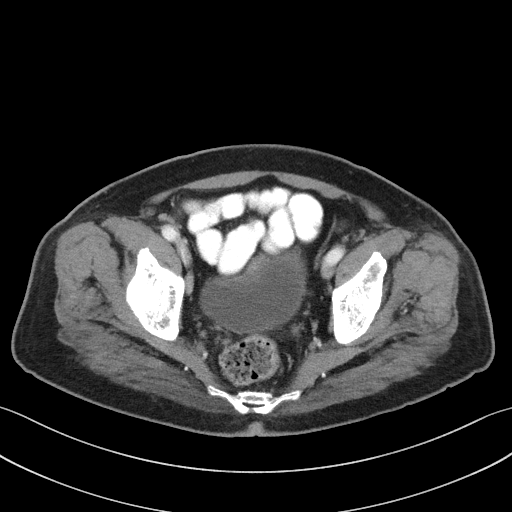
[im 32/94  soft-tissue]
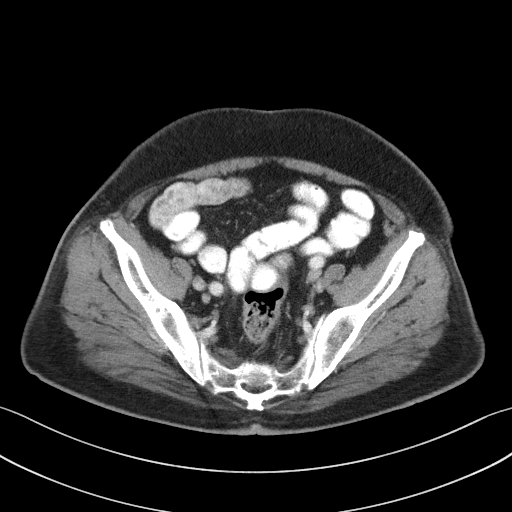
[im 42/94  soft-tissue]
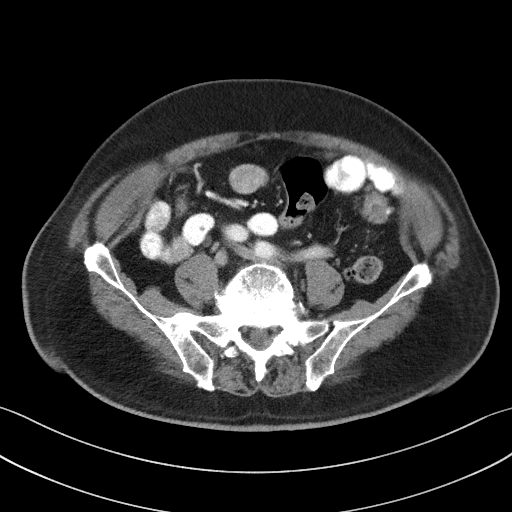
[im 47/94  soft-tissue]
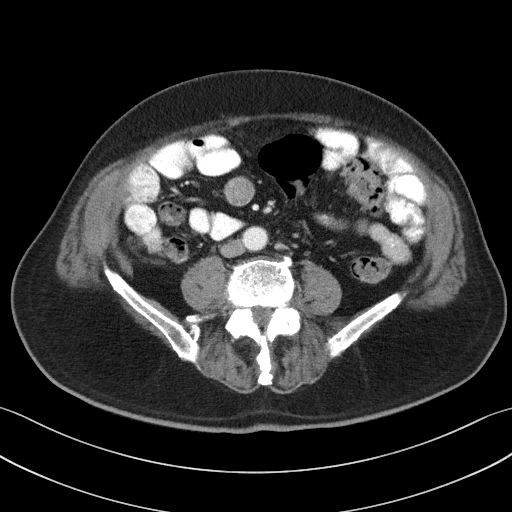
[im 52/94  soft-tissue]
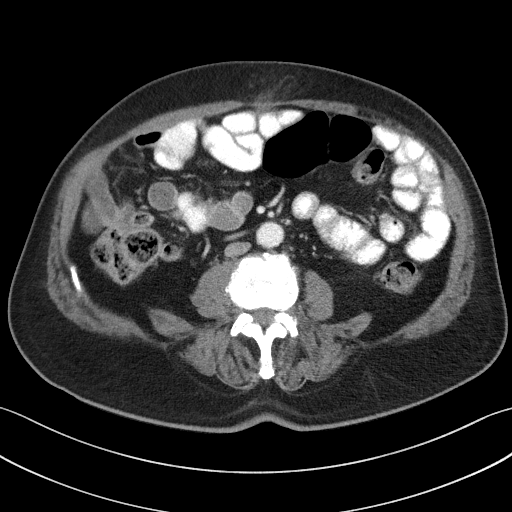
[im 63/94  soft-tissue]
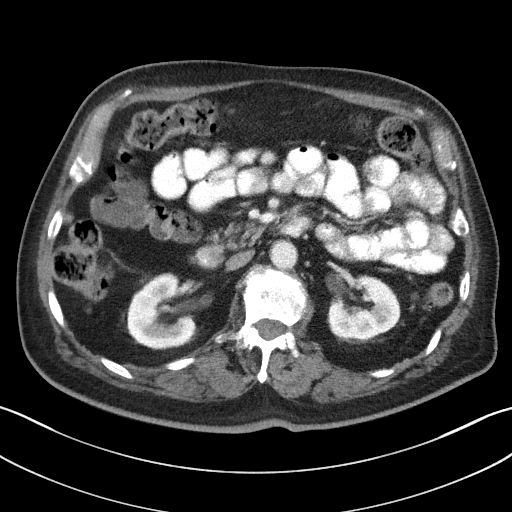
[im 63/94  bone]
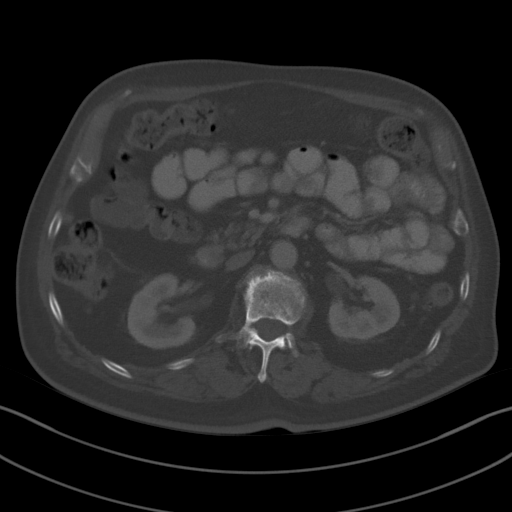
[im 68/94  soft-tissue]
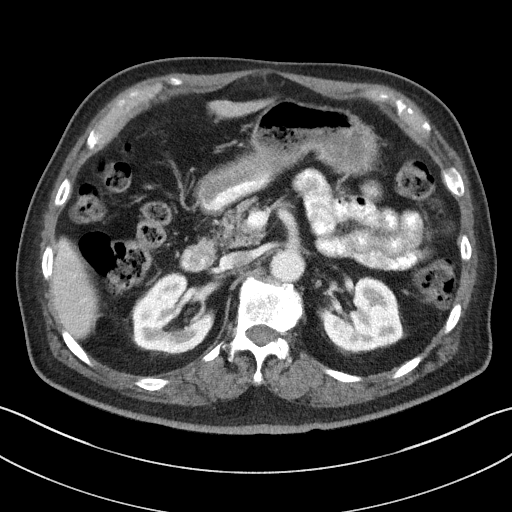
[im 73/94  soft-tissue]
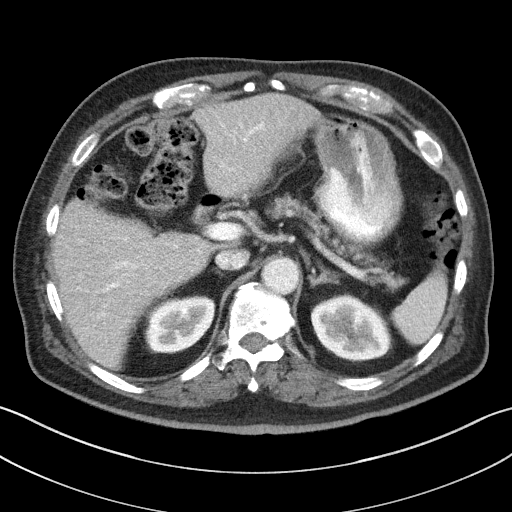
[im 83/94  soft-tissue]
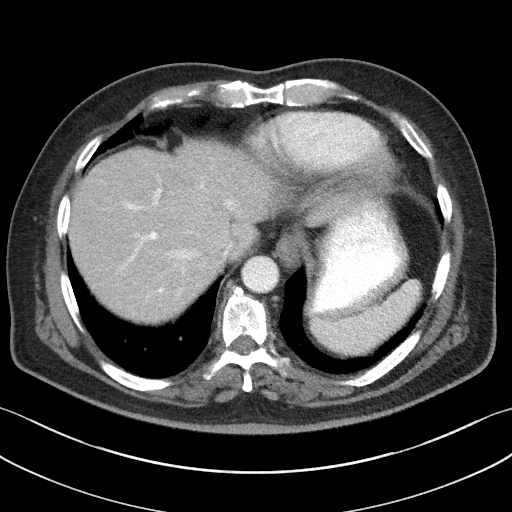
[im 88/94  soft-tissue]
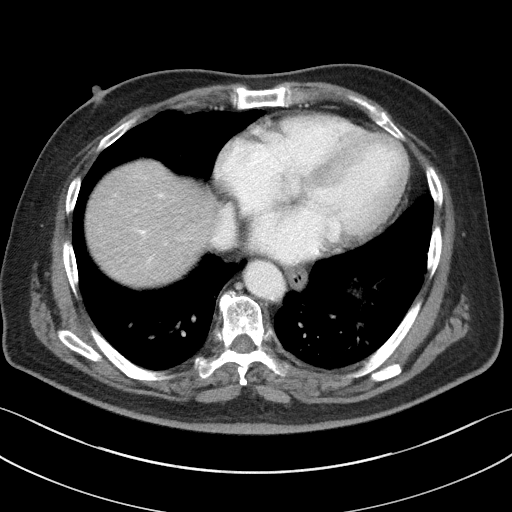

[Series 4: coronal st · coronal · 0.71mm/px · 3 of 91 slices shown]
[im 31/91  soft-tissue]
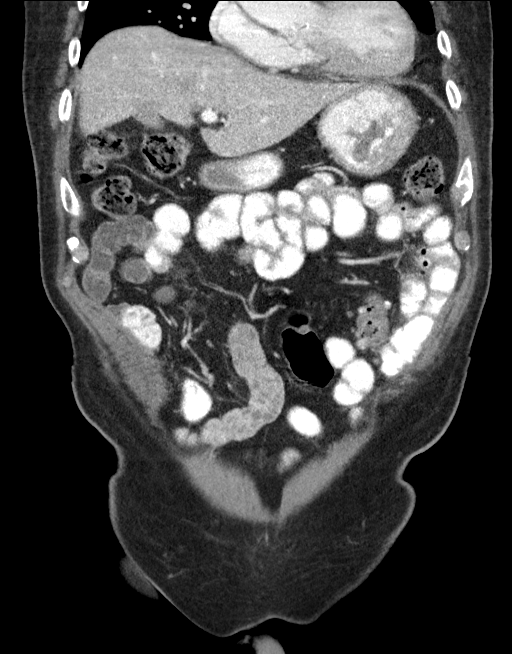
[im 41/91  soft-tissue]
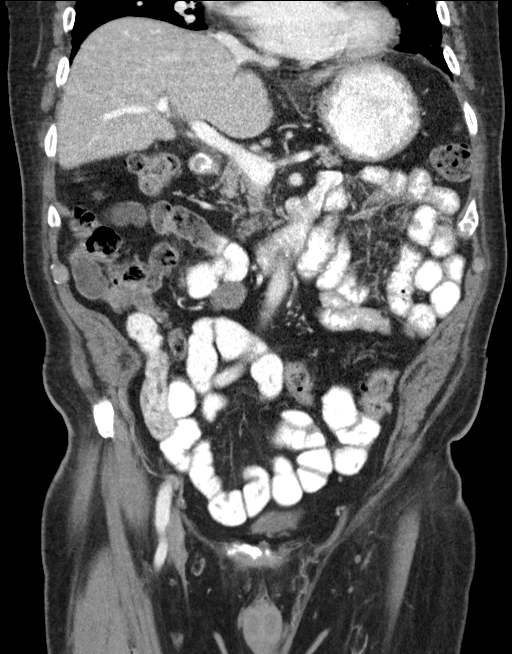
[im 51/91  soft-tissue]
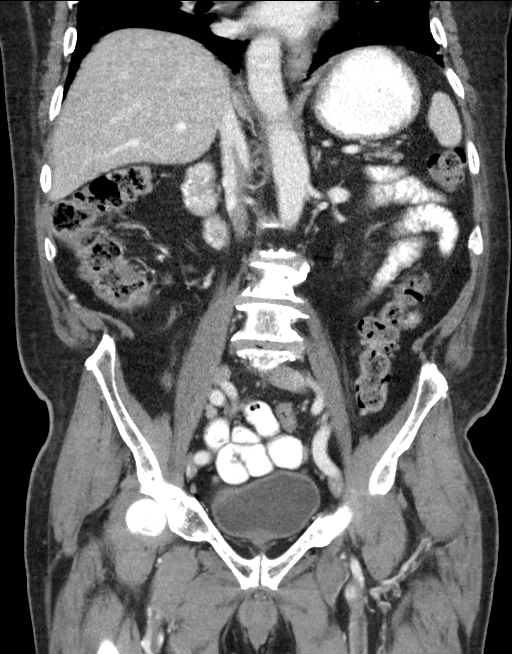

[16 of 46 positions shown; findings below may reference images not displayed]

FINDINGS: Lower chest: Calcified mediastinal and hilar lymph nodes are
identified. Heart size is mildly enlarged. No pericardial effusion.
Mild dependent atelectasis is seen.

Hepatobiliary: The liver is low attenuating consistent with fatty
infiltration. No focal lesion. The gallbladder and biliary tree
appear normal.

Pancreas: Unremarkable. No pancreatic ductal dilatation or
surrounding inflammatory changes.

Spleen: Normal in size without focal abnormality.

Adrenals/Urinary Tract: Adrenal glands are unremarkable. Kidneys are
normal, without renal calculi, focal lesion, or hydronephrosis.
Bladder is unremarkable.

Stomach/Bowel: Scattered diverticula are identified about the colon
but no diverticulitis is seen. The stomach, small bowel and appendix
appear normal.

Vascular/Lymphatic: No significant vascular findings are present. No
enlarged abdominal or pelvic lymph nodes.

Reproductive: The prostate gland is enlarged.

Other: No fluid collection or hernia.

Musculoskeletal: No lytic or sclerotic bony lesion. Convex left
scoliosis is identified. The patient has bilateral L5 pars
interarticularis defects with 1.1 cm anterolisthesis L5 on S1.
Marked multilevel loss of disc space height is seen at thoracic and
lumbar spine.
IMPRESSION: No acute abnormality abdomen or pelvis.

Fatty infiltration of the liver.

Cardiomegaly.

Diverticulosis without diverticulitis.

Prostatomegaly.

Multilevel spondylosis. Bilateral L5 pars interarticularis defects
with 1.1 cm anterolisthesis L5 on S1 also seen.

## 2019-02-25 DIAGNOSIS — T8522XA Displacement of intraocular lens, initial encounter: Secondary | ICD-10-CM | POA: Diagnosis not present

## 2019-02-25 DIAGNOSIS — H2701 Aphakia, right eye: Secondary | ICD-10-CM | POA: Diagnosis not present

## 2019-02-25 DIAGNOSIS — H43391 Other vitreous opacities, right eye: Secondary | ICD-10-CM | POA: Diagnosis not present

## 2019-04-06 ENCOUNTER — Other Ambulatory Visit: Payer: Self-pay

## 2019-04-09 ENCOUNTER — Ambulatory Visit (INDEPENDENT_AMBULATORY_CARE_PROVIDER_SITE_OTHER): Payer: Medicare Other | Admitting: Family Medicine

## 2019-04-09 ENCOUNTER — Other Ambulatory Visit: Payer: Self-pay

## 2019-04-09 ENCOUNTER — Encounter: Payer: Self-pay | Admitting: Family Medicine

## 2019-04-09 VITALS — BP 118/63 | HR 71 | Temp 97.0°F | Ht 62.0 in | Wt 149.8 lb

## 2019-04-09 DIAGNOSIS — E119 Type 2 diabetes mellitus without complications: Secondary | ICD-10-CM

## 2019-04-09 DIAGNOSIS — I10 Essential (primary) hypertension: Secondary | ICD-10-CM

## 2019-04-09 DIAGNOSIS — I152 Hypertension secondary to endocrine disorders: Secondary | ICD-10-CM

## 2019-04-09 DIAGNOSIS — E1159 Type 2 diabetes mellitus with other circulatory complications: Secondary | ICD-10-CM | POA: Diagnosis not present

## 2019-04-09 DIAGNOSIS — Z6830 Body mass index (BMI) 30.0-30.9, adult: Secondary | ICD-10-CM | POA: Diagnosis not present

## 2019-04-09 LAB — BAYER DCA HB A1C WAIVED: HB A1C (BAYER DCA - WAIVED): 7.2 % — ABNORMAL HIGH (ref <7.0)

## 2019-04-09 MED ORDER — METOPROLOL TARTRATE 25 MG PO TABS: 75.0000 mg | ORAL_TABLET | Freq: Two times a day (BID) | ORAL | 3 refills | Status: DC

## 2019-04-09 NOTE — Progress Notes (Signed)
BP 118/63   Pulse 71   Temp (!) 97 F (36.1 C) (Temporal)   Ht 5\' 2"  (1.575 m)   Wt 149 lb 12.8 oz (67.9 kg)   SpO2 97%   BMI 27.40 kg/m    Subjective:   Patient ID: , male    DOB: August 31, 1931, 86 y.o.   MRN: [id number]  HPI: Roberto Miller is a 86 y.o. male presenting on 04/09/2019 for Diabetes (3 month follow up)   HPI Type 2 diabetes mellitus Patient comes in today for recheck of his diabetes. Patient has been currently taking glipizide and Janumet, A1c is up just a little bit but not enough to change medications but will monitor closely and if it increases more than we may need to adjust. Patient is currently on an ACE inhibitor/ARB. Patient has not seen an ophthalmologist this year. Patient denies any issues with their feet.   Hypertension Patient is currently on lisinopril and metoprolol, and their blood pressure today is 118/63. Patient denies any lightheadedness or dizziness. Patient denies headaches, blurred vision, chest pains, shortness of breath, or weakness. Denies any side effects from medication and is content with current medication.   overweight Discussed and counseled on dietary and activity changes.  Discussed improving dietary lifestyle to improve diabetes  Relevant past medical, surgical, family and social history reviewed and updated as indicated. Interim medical history since our last visit reviewed. Allergies and medications reviewed and updated.  Review of Systems  Constitutional: Negative for chills and fever.  Eyes: Negative for discharge.  Respiratory: Negative for shortness of breath and wheezing.   Cardiovascular: Negative for chest pain and leg swelling.  Musculoskeletal: Negative for back pain and gait problem.  Skin: Negative for rash.  Neurological: Negative for dizziness, weakness and light-headedness.  All other systems reviewed and are negative.   Per HPI unless specifically indicated above   Allergies as of 04/09/2019    No Known Allergies     Medication List       Accurate as of April 09, 2019 11:59 PM. If you have any questions, ask your nurse or doctor.        STOP taking these medications   folic acid 1 MG tablet Commonly known as: FOLVITE Stopped by: April 11, 2019 [name], MD     TAKE these medications   glipiZIDE 10 MG tablet Commonly known as: GLUCOTROL Take 1 tablet (10 mg total) by mouth 2 (two) times daily.   glucose blood test strip Commonly known as: OneTouch Verio Check blood sugar one time er day and prn   Dx E11.9   Janumet 50-1000 MG tablet Generic drug: sitaGLIPtin-metformin TAKE ONE TABLET TWICE A DAY WITH FOOD   lisinopril 5 MG tablet Commonly known as: ZESTRIL Take 2 tablets (10 mg total) by mouth daily.   metoprolol tartrate 25 MG tablet Commonly known as: LOPRESSOR Take 3 tablets (75 mg total) by mouth 2 (two) times daily. What changed:   how much to take  how to take this  when to take this  additional instructions Changed by: Elige Radon, MD   OneTouch Delica Lancets 33G Misc Check blood sugar 1x per day and prn   Dx E11.9        Objective:   BP 118/63   Pulse 71   Temp (!) 97 F (36.1 C) (Temporal)   Ht 5\' 2"  (1.575 m)   Wt 149 lb 12.8 oz (67.9 kg)   SpO2 97%   BMI 27.40 kg/m  Wt Readings from Last 3 Encounters:  04/09/19 149 lb 12.8 oz (67.9 kg)  01/10/19 153 lb 6.4 oz (69.6 kg)  08/29/18 152 lb (68.9 kg)    Physical Exam Vitals and nursing note reviewed.  Constitutional:      General: He is not in acute distress.    Appearance: He is well-developed. He is not diaphoretic.  Eyes:     General: No scleral icterus.    Conjunctiva/sclera: Conjunctivae normal.  Neck:     Thyroid: No thyromegaly.  Cardiovascular:     Rate and Rhythm: Normal rate and regular rhythm.     Heart sounds: Normal heart sounds. No murmur.  Pulmonary:     Effort: Pulmonary effort is normal. No respiratory distress.     Breath sounds: Normal  breath sounds. No wheezing.  Musculoskeletal:        General: Normal range of motion.     Cervical back: Neck supple.  Lymphadenopathy:     Cervical: No cervical adenopathy.  Skin:    General: Skin is warm and dry.     Findings: No rash.  Neurological:     Mental Status: He is alert and oriented to person, place, and time.     Coordination: Coordination normal.  Psychiatric:        Behavior: Behavior normal.     Results for orders placed or performed in visit on 04/09/19  Bayer DCA Hb A1c Waived  Result Value Ref Range   HB A1C (BAYER DCA - WAIVED) 7.2 (H) <7.0 %    Assessment & Plan:   Problem List Items Addressed This Visit      Cardiovascular and Mediastinum   Hypertension associated with diabetes ([place])   Relevant Medications   metoprolol tartrate (LOPRESSOR) 25 MG tablet     Endocrine   Diabetes ([place]) - Primary   Relevant Orders   Bayer DCA Hb A1c Waived (Completed)     Other   BMI 30.0-30.9,adult      Will continue current medication, no major changes today.  Will focus on dietary and lifestyle modification. Follow up plan: Return in about 3 months (around 07/08/2019), or if symptoms worsen or fail to improve, for Diabetes and hypertension.  Counseling provided for all of the vaccine components Orders Placed This Encounter  Procedures  . Bayer [facility] Hb A1c [place], MD [facility] 04/15/2019, 8:50 PM

## 2019-05-07 ENCOUNTER — Telehealth: Payer: Self-pay | Admitting: Family Medicine

## 2019-05-07 NOTE — Telephone Encounter (Signed)
As far as all the information we have so far, the benefits outweigh the risk and I lean towards getting the vaccination 

## 2019-05-07 NOTE — Telephone Encounter (Signed)
Daughter aware.

## 2019-07-05 ENCOUNTER — Telehealth: Payer: Self-pay | Admitting: Family Medicine

## 2019-07-05 NOTE — Chronic Care Management (AMB) (Signed)
  Care Management   Note  07/05/2019 Name: Abe Schools MRN: [id number] DOB: Aug 05, 1931  Javarri Segal is a 86 y.o. year old male who is a primary care patient of [name], Elige Radon, MD and is actively engaged with the care management team. I reached out to [name] by phone today to assist with scheduling a follow up visit with the RN Case Manager  Follow up plan: Telephone appointment with care management team member scheduled for: 08/09/2019  Penne Lash, RMA Care Guide, Embedded Care Coordination [facility]  [place], [place] [address] Direct Dial: [phone] [email or web address] Website: [email or web address]

## 2019-07-09 ENCOUNTER — Encounter: Payer: Self-pay | Admitting: Family Medicine

## 2019-07-09 ENCOUNTER — Ambulatory Visit (INDEPENDENT_AMBULATORY_CARE_PROVIDER_SITE_OTHER): Payer: Medicare HMO | Admitting: Family Medicine

## 2019-07-09 ENCOUNTER — Other Ambulatory Visit: Payer: Self-pay

## 2019-07-09 VITALS — BP 127/67 | HR 59 | Temp 97.8°F | Ht 62.0 in | Wt 148.0 lb

## 2019-07-09 DIAGNOSIS — E119 Type 2 diabetes mellitus without complications: Secondary | ICD-10-CM | POA: Diagnosis not present

## 2019-07-09 DIAGNOSIS — Z6830 Body mass index (BMI) 30.0-30.9, adult: Secondary | ICD-10-CM | POA: Diagnosis not present

## 2019-07-09 DIAGNOSIS — E1159 Type 2 diabetes mellitus with other circulatory complications: Secondary | ICD-10-CM

## 2019-07-09 DIAGNOSIS — I10 Essential (primary) hypertension: Secondary | ICD-10-CM | POA: Diagnosis not present

## 2019-07-09 LAB — BAYER DCA HB A1C WAIVED: HB A1C (BAYER DCA - WAIVED): 6.8 % (ref <7.0)

## 2019-07-09 NOTE — Progress Notes (Signed)
 BP 127/67   Pulse (!) 59   Temp 97.8 F (36.6 C) (Temporal)   Ht 5' 2" (1.575 m)   Wt 148 lb (67.1 kg)   BMI 27.07 kg/m    Subjective:   Patient ID: Roberto Miller, male    DOB: 05/11/1931, 87 y.o.   MRN: [id number]  HPI: Nabil Pritchard is a 87 y.o. male presenting on 07/09/2019 for Medical Management of Chronic Issues   HPI Type 2 diabetes mellitus Patient comes in today for recheck of his diabetes. Patient has been currently taking glipizide and Janumet. Patient is currently on an ACE inhibitor/ARB. Patient has not seen an ophthalmologist this year. Patient denies any issues with their feet.   Hypertension Patient is currently on lisinopril and metoprolol, and their blood pressure today is 127/67. Patient denies any lightheadedness or dizziness. Patient denies headaches, blurred vision, chest pains, shortness of breath, or weakness. Denies any side effects from medication and is content with current medication.   Relevant past medical, surgical, family and social history reviewed and updated as indicated. Interim medical history since our last visit reviewed. Allergies and medications reviewed and updated.  Review of Systems  Constitutional: Negative for chills and fever.  Respiratory: Negative for shortness of breath and wheezing.   Cardiovascular: Negative for chest pain and leg swelling.  Musculoskeletal: Negative for back pain and gait problem.  Skin: Negative for rash.  Neurological: Negative for dizziness, weakness and light-headedness.  All other systems reviewed and are negative.   Per HPI unless specifically indicated above   Allergies as of 07/09/2019   No Known Allergies     Medication List       Accurate as of July 09, 2019  2:21 PM. If you have any questions, ask your nurse or doctor.        glipiZIDE 10 MG tablet Commonly known as: GLUCOTROL Take 1 tablet (10 mg total) by mouth 2 (two) times daily.   glucose blood test strip Commonly known  as: OneTouch Verio Check blood sugar one time er day and prn   Dx E11.9   Janumet 50-1000 MG tablet Generic drug: sitaGLIPtin-metformin TAKE ONE TABLET TWICE A DAY WITH FOOD   lisinopril 5 MG tablet Commonly known as: ZESTRIL Take 2 tablets (10 mg total) by mouth daily.   metoprolol tartrate 25 MG tablet Commonly known as: LOPRESSOR Take 3 tablets (75 mg total) by mouth 2 (two) times daily.   OneTouch Delica Lancets 33G Misc Check blood sugar 1x per day and prn   Dx E11.9        Objective:   BP 127/67   Pulse (!) 59   Temp 97.8 F (36.6 C) (Temporal)   Ht 5' 2" (1.575 m)   Wt 148 lb (67.1 kg)   BMI 27.07 kg/m   Wt Readings from Last 3 Encounters:  07/09/19 148 lb (67.1 kg)  04/09/19 149 lb 12.8 oz (67.9 kg)  01/10/19 153 lb 6.4 oz (69.6 kg)    Physical Exam Vitals and nursing note reviewed.  Constitutional:      General: He is not in acute distress.    Appearance: He is well-developed. He is not diaphoretic.  Eyes:     General: No scleral icterus.    Conjunctiva/sclera: Conjunctivae normal.  Neck:     Thyroid: No thyromegaly.  Cardiovascular:     Rate and Rhythm: Normal rate and regular rhythm.     Heart sounds: Normal heart sounds. No murmur.  Pulmonary:       Effort: Pulmonary effort is normal. No respiratory distress.     Breath sounds: Normal breath sounds. No wheezing.  Musculoskeletal:        General: Normal range of motion.     Cervical back: Neck supple.  Lymphadenopathy:     Cervical: No cervical adenopathy.  Skin:    General: Skin is warm and dry.     Findings: No rash.  Neurological:     Mental Status: He is alert and oriented to person, place, and time.     Coordination: Coordination normal.  Psychiatric:        Behavior: Behavior normal.     A1c is 6.8  Assessment & Plan:   Problem List Items Addressed This Visit      Cardiovascular and Mediastinum   Hypertension associated with diabetes ([place])   Relevant Orders   CMP14+EGFR    Lipid panel     Endocrine   Diabetes ([place]) - Primary   Relevant Orders   Bayer DCA Hb A1c Waived   CBC with Differential/Platelet   CMP14+EGFR     Other   BMI 30.0-30.9,adult   Relevant Orders   Lipid panel      A1c is improved at 6.8 which is improved, continue current medication and activity. Follow up plan: Return in about 3 months (around 10/08/2019), or if symptoms worsen or fail to improve, for Diabetes and hypertension recheck.  Counseling provided for all of the vaccine components Orders Placed This Encounter  Procedures  . Bayer DCA Hb A1c Waived  . CBC with Differential/Platelet  . CMP14+EGFR  . Lipid panel    Caryl Pina, MD [facility] 07/09/2019, 2:21 PM

## 2019-07-10 LAB — CBC WITH DIFFERENTIAL/PLATELET
Basophils Absolute: 0 10*3/uL (ref 0.0–0.2)
Basos: 0 %
EOS (ABSOLUTE): 0.1 10*3/uL (ref 0.0–0.4)
Eos: 2 %
Hematocrit: 35.6 % — ABNORMAL LOW (ref 37.5–51.0)
Hemoglobin: 11.8 g/dL — ABNORMAL LOW (ref 13.0–17.7)
Immature Grans (Abs): 0 10*3/uL (ref 0.0–0.1)
Immature Granulocytes: 0 %
Lymphocytes Absolute: 1.1 10*3/uL (ref 0.7–3.1)
Lymphs: 24 %
MCH: 30 pg (ref 26.6–33.0)
MCHC: 33.1 g/dL (ref 31.5–35.7)
MCV: 91 fL (ref 79–97)
Monocytes Absolute: 0.4 10*3/uL (ref 0.1–0.9)
Monocytes: 9 %
Neutrophils Absolute: 2.9 10*3/uL (ref 1.4–7.0)
Neutrophils: 65 %
Platelets: 175 10*3/uL (ref 150–450)
RBC: 3.93 x10E6/uL — ABNORMAL LOW (ref 4.14–5.80)
RDW: 13.5 % (ref 11.6–15.4)
WBC: 4.6 10*3/uL (ref 3.4–10.8)

## 2019-07-10 LAB — LIPID PANEL
Chol/HDL Ratio: 3 ratio (ref 0.0–5.0)
Cholesterol, Total: 169 mg/dL (ref 100–199)
HDL: 56 mg/dL
LDL Chol Calc (NIH): 90 mg/dL (ref 0–99)
Triglycerides: 131 mg/dL (ref 0–149)
VLDL Cholesterol Cal: 23 mg/dL (ref 5–40)

## 2019-07-10 LAB — CMP14+EGFR
ALT: 14 IU/L (ref 0–44)
AST: 26 IU/L (ref 0–40)
Albumin/Globulin Ratio: 2 (ref 1.2–2.2)
Albumin: 4.6 g/dL (ref 3.6–4.6)
Alkaline Phosphatase: 75 IU/L (ref 39–117)
BUN/Creatinine Ratio: 18 (ref 10–24)
BUN: 18 mg/dL (ref 8–27)
Bilirubin Total: 0.5 mg/dL (ref 0.0–1.2)
CO2: 24 mmol/L (ref 20–29)
Calcium: 9.5 mg/dL (ref 8.6–10.2)
Chloride: 106 mmol/L (ref 96–106)
Creatinine, Ser: 0.98 mg/dL (ref 0.76–1.27)
GFR calc Af Amer: 81 mL/min/{1.73_m2} (ref >59)
GFR calc non Af Amer: 70 mL/min/{1.73_m2} (ref >59)
Globulin, Total: 2.3 g/dL (ref 1.5–4.5)
Glucose: 212 mg/dL — ABNORMAL HIGH (ref 65–99)
Potassium: 4.8 mmol/L (ref 3.5–5.2)
Sodium: 141 mmol/L (ref 134–144)
Total Protein: 6.9 g/dL (ref 6.0–8.5)

## 2019-07-13 ENCOUNTER — Telehealth: Payer: Self-pay | Admitting: Family Medicine

## 2019-07-13 NOTE — Telephone Encounter (Signed)
Nicholos Johns called from [employer] stating that patient need One Touch Strips sent to his pharmacy ([facility], [place]) because he has a new machine being shipped to him and is almost out of strips.

## 2019-07-13 NOTE — Telephone Encounter (Signed)
Left message to call back to verify which type of One Touch machine was being shipped to patient.

## 2019-08-07 DIAGNOSIS — H35363 Drusen (degenerative) of macula, bilateral: Secondary | ICD-10-CM | POA: Diagnosis not present

## 2019-08-09 ENCOUNTER — Ambulatory Visit (INDEPENDENT_AMBULATORY_CARE_PROVIDER_SITE_OTHER): Payer: Medicare HMO | Admitting: *Deleted

## 2019-08-09 DIAGNOSIS — E1159 Type 2 diabetes mellitus with other circulatory complications: Secondary | ICD-10-CM | POA: Diagnosis not present

## 2019-08-09 DIAGNOSIS — E1169 Type 2 diabetes mellitus with other specified complication: Secondary | ICD-10-CM | POA: Diagnosis not present

## 2019-08-09 DIAGNOSIS — H40013 Open angle with borderline findings, low risk, bilateral: Secondary | ICD-10-CM | POA: Diagnosis not present

## 2019-08-09 DIAGNOSIS — I10 Essential (primary) hypertension: Secondary | ICD-10-CM | POA: Diagnosis not present

## 2019-08-09 DIAGNOSIS — Z961 Presence of intraocular lens: Secondary | ICD-10-CM | POA: Diagnosis not present

## 2019-08-09 DIAGNOSIS — E119 Type 2 diabetes mellitus without complications: Secondary | ICD-10-CM | POA: Diagnosis not present

## 2019-08-09 NOTE — Chronic Care Management (AMB) (Signed)
Chronic Care Management   Follow Up Note   08/09/2019 Name: Roberto Miller MRN: [id number] DOB: 18-Aug-1931  Referred by: [name], Elige Radon, MD Reason for referral : Chronic Care Management (RN follow up)   Roberto Miller is a 86 y.o. year old male who is a primary care patient of [name], Elige Radon, MD. The CCM team was consulted for assistance with chronic disease management and care coordination needs.    Review of patient status, including review of consultants reports, relevant laboratory and other test results, and collaboration with appropriate care team members and the patient's provider was performed as part of comprehensive patient evaluation and provision of chronic care management services.    I spoke with Roberto Miller daughter, [name], today by telephone regarding management of his chronic medical conditions. She reports that they are well controlled at this time and that he does not have any CCM needs. They are interested in a follow-up call from the CCM team to reassess in a couple of months.   SDOH (Social Determinants of Health) assessments performed: Yes See Care Plan activities for detailed interventions related to [facility])     Outpatient Encounter Medications as of 08/09/2019  Medication Sig  . glipiZIDE (GLUCOTROL) 10 MG tablet Take 1 tablet (10 mg total) by mouth 2 (two) times daily.  Marland Kitchen glucose blood (ONETOUCH VERIO) test strip Check blood sugar one time er day and prn   Dx E11.9  . lisinopril (ZESTRIL) 5 MG tablet Take 2 tablets (10 mg total) by mouth daily.  . metoprolol tartrate (LOPRESSOR) 25 MG tablet Take 3 tablets (75 mg total) by mouth 2 (two) times daily.  [name] DELICA LANCETS 33G MISC Check blood sugar 1x per day and prn   Dx E11.9  . sitaGLIPtin-metformin (JANUMET) 50-1000 MG tablet TAKE ONE TABLET TWICE A DAY WITH FOOD   No facility-administered encounter medications on file as of 08/09/2019.     RN Care Plan   . "We want to keep his blood  sugar under control" (pt-stated)       CARE PLAN ENTRY (see longitudinal plan of care for additional care plan information)  Current Barriers:  . Chronic Disease Management support and education needs related to diabetes  Nurse Case Manager Clinical Goal(s):  Marland Kitchen Over the next 60 days, patient will continue to demonstrate compliance with diabetes management by taking medications as prescribed . Over the next 60 days, patient will continue to check blood sugar daily as directed  Interventions:  . Inter-disciplinary care team collaboration (see longitudinal plan of care) . Evaluation of current treatment plan related to diabetes and patient's adherence to plan as established by provider. . Reviewed medications with patient and discussed glipizide, metformin, Janumet o Discussed affordability. Per daughter, no problems at this time . Discussed plans with patient for ongoing care management follow up and provided patient with direct contact information for care management team . Advised patient, providing education and rationale, to check cbg daily and record, calling [phone] for findings outside established parameters.   . Chart reviewed including recent office notes and lab results . Provided patient's daughter with RN Care Manager contact information and encouraged to reach out as needed  Patient Self Care Activities:  . Performs ADL's independently . Performs IADL's independently  Initial goal documentation          Plan:   The care management team will reach out to the patient again over the next 60 days.    Demetrios Loll, BSN, RN-BC  [place] [facility] Care Management Direct Dial: [phone]

## 2019-08-09 NOTE — Patient Instructions (Signed)
Visit Information  Goals Addressed            This Visit's Progress     Patient Stated   . "We want to keep his blood sugar under control" (pt-stated)       CARE PLAN ENTRY (see longitudinal plan of care for additional care plan information)  Current Barriers:  . Chronic Disease Management support and education needs related to diabetes  Nurse Case Manager Clinical Goal(s):  Marland Kitchen Over the next 60 days, patient will continue to demonstrate compliance with diabetes management by taking medications as prescribed . Over the next 60 days, patient will continue to check blood sugar daily as directed  Interventions:  . Inter-disciplinary care team collaboration (see longitudinal plan of care) . Evaluation of current treatment plan related to diabetes and patient's adherence to plan as established by provider. . Reviewed medications with patient and discussed glipizide, metformin, Janumet o Discussed affordability. Per daughter, no problems at this time . Discussed plans with patient for ongoing care management follow up and provided patient with direct contact information for care management team . Advised patient, providing education and rationale, to check cbg daily and record, calling [phone] for findings outside established parameters.   . Chart reviewed including recent office notes and lab results . Provided patient's daughter with RN Care Manager contact information and encouraged to reach out as needed  Patient Self Care Activities:  . Performs ADL's independently . Performs IADL's independently  Initial goal documentation         The patient verbalized understanding of instructions provided today and declined a print copy of patient instruction materials.   Follow-up Plan The care management team will reach out to the patient again over the next 60 days.   Demetrios Loll, BSN, RN-BC Embedded Chronic Care Manager [facility] Care  Management Direct Dial: [phone]

## 2019-09-06 ENCOUNTER — Ambulatory Visit (INDEPENDENT_AMBULATORY_CARE_PROVIDER_SITE_OTHER): Payer: Medicare HMO | Admitting: *Deleted

## 2019-09-06 DIAGNOSIS — Z0000 Encounter for general adult medical examination without abnormal findings: Secondary | ICD-10-CM

## 2019-09-06 NOTE — Progress Notes (Signed)
MEDICARE ANNUAL WELLNESS VISIT  09/06/2019  Telephone Visit Disclaimer This Medicare AWV was conducted by telephone due to national recommendations for restrictions regarding the COVID-19 Pandemic (e.g. social distancing).  I verified, using two identifiers, that I am speaking with Roberto Miller or their authorized healthcare agent. I discussed the limitations, risks, security, and privacy concerns of performing an evaluation and management service by telephone and the potential availability of an in-person appointment in the future. The patient expressed understanding and agreed to proceed.   Subjective:  Roberto Miller is a 86 y.o. male patient of [name], Elige Radon, MD who had a Medicare Annual Wellness Visit today via telephone with the interpreter from [employer]. Roberto Miller is Retired and lives with their spouse.Since his wife had a stroke he is her primary caregiver and helps her do most ADL's.  he has 9 children. he reports that he is socially active and does interact with friends/family regularly. he is moderately physically active and enjoys working in the garden and being outside.  Patient Care Team: [name], Elige Radon, MD as PCP - General (Family Medicine) Gwenith Daily, RN as Case Manager  Advanced Directives 09/06/2019 08/29/2018 04/01/2014  Does Patient Have a Medical Advance Directive? No Yes No  Type of Advance Directive - Living will;Healthcare Power of Attorney -  Does patient want to make changes to medical advance directive? - No - Patient declined -  Copy of Healthcare Power of Attorney in Chart? - No - copy requested -  Would patient like information on creating a medical advance directive? No - Patient declined - Yes - Educational materials given    Hospital Utilization Over the Past 12 Months: # of hospitalizations or ER visits: 0 # of surgeries: 1  Review of Systems    Patient reports that his overall health is better compared to last  year.  History obtained from chart review  Patient Reported Readings (BP, Pulse, CBG, Weight, etc) none  Pain Assessment       Current Medications & Allergies (verified) Allergies as of 09/06/2019   No Known Allergies     Medication List       Accurate as of September 06, 2019  2:37 PM. If you have any questions, ask your nurse or doctor.        glipiZIDE 10 MG tablet Commonly known as: GLUCOTROL Take 1 tablet (10 mg total) by mouth 2 (two) times daily.   glucose blood test strip Commonly known as: OneTouch Verio Check blood sugar one time er day and prn   Dx E11.9   Janumet 50-1000 MG tablet Generic drug: sitaGLIPtin-metformin TAKE ONE TABLET TWICE A DAY WITH FOOD   latanoprost 0.005 % ophthalmic solution Commonly known as: XALATAN   lisinopril 5 MG tablet Commonly known as: ZESTRIL Take 2 tablets (10 mg total) by mouth daily.   metoprolol tartrate 25 MG tablet Commonly known as: LOPRESSOR Take 3 tablets (75 mg total) by mouth 2 (two) times daily.   OneTouch Delica Lancets 33G Misc Check blood sugar 1x per day and prn   Dx E11.9       History (reviewed): Past Medical History:  Diagnosis Date  . Diabetes mellitus without complication (HCC)   . Hyperlipidemia   . Hypertension    Past Surgical History:  Procedure Laterality Date  . EYE SURGERY Right    Family History  Problem Relation Age of Onset  . Diabetes Sister   . Diabetes Brother   . Diabetes Sister  Social History   Socioeconomic History  . Marital status: Married    Spouse name: Davonna Belling  . Number of children: 9  . Years of education: 1  . Highest education level: 1st grade  Occupational History  . Occupation: retired  Tobacco Use  . Smoking status: Never Smoker  . Smokeless tobacco: Never Used  Vaping Use  . Vaping Use: Never used  Substance and Sexual Activity  . Alcohol use: No  . Drug use: No  . Sexual activity: Not Currently  Other Topics Concern  . Not on file   Social History Narrative  . Not on file   Social Determinants of Health   Financial Resource Strain: Low Risk   . Difficulty of Paying Living Expenses: Not hard at all  Food Insecurity: No Food Insecurity  . Worried About Programme researcher, broadcasting/film/video in the Last Year: Never true  . Ran Out of Food in the Last Year: Never true  Transportation Needs: No Transportation Needs  . Lack of Transportation (Medical): No  . Lack of Transportation (Non-Medical): No  Physical Activity: Sufficiently Active  . Days of Exercise per Week: 7 days  . Minutes of Exercise per Session: 30 min  Stress: No Stress Concern Present  . Feeling of Stress : Only a little  Social Connections: Socially Integrated  . Frequency of Communication with Friends and Family: More than three times a week  . Frequency of Social Gatherings with Friends and Family: More than three times a week  . Attends Religious Services: More than 4 times per year  . Active Member of Clubs or Organizations: Yes  . Attends Banker Meetings: More than 4 times per year  . Marital Status: Married    Activities of Daily Living In your present state of health, do you have any difficulty performing the following activities: 09/06/2019 08/09/2019  Hearing? N N  Vision? N N  Comment wears glasses -  Difficulty concentrating or making decisions? N N  Walking or climbing stairs? N N  Dressing or bathing? N N  Doing errands, shopping? N N  Preparing Food and eating ? N N  Using the Toilet? N N  In the past six months, have you accidently leaked urine? N N  Do you have problems with loss of bowel control? N N  Managing your Medications? N N  Managing your Finances? N N  Housekeeping or managing your Housekeeping? N N  Some recent data might be hidden    Patient Education/ Literacy How often do you need to have someone help you when you read instructions, pamphlets, or other written materials from your doctor or pharmacy?: 1 -  Never What is the last grade level you completed in school?: 1st grade  Exercise Current Exercise Habits: Home exercise routine, Type of exercise: walking, Time (Minutes): 30, Frequency (Times/Week): 7, Weekly Exercise (Minutes/Week): 210, Intensity: Mild, Exercise limited by: None identified  Diet Patient reports consuming 2 meals a day and 1 snack(s) a day Patient reports that his primary diet is: Regular Patient reports that she does have regular access to food.   Depression Screen PHQ 2/9 Scores 09/06/2019 07/09/2019 04/09/2019 01/10/2019 08/29/2018 03/06/2018 11/08/2017  PHQ - 2 Score 0 0 0 0 0 0 0     Fall Risk Fall Risk  09/06/2019 07/09/2019 04/09/2019 01/10/2019 08/29/2018  Falls in the past year? 0 0 0 0 0  Number falls in past yr: - - - - 0  Injury with Fall? - - - - -  Risk for fall due to : - - - - -  Follow up - - - - -     Objective:  Roberto Miller seemed alert and oriented and he participated appropriately during our telephone visit.  Blood Pressure Weight BMI  BP Readings from Last 3 Encounters:  07/09/19 127/67  04/09/19 118/63  01/10/19 110/66   Wt Readings from Last 3 Encounters:  07/09/19 148 lb (67.1 kg)  04/09/19 149 lb 12.8 oz (67.9 kg)  01/10/19 153 lb 6.4 oz (69.6 kg)   BMI Readings from Last 1 Encounters:  07/09/19 27.07 kg/m    *Unable to obtain current vital signs, weight, and BMI due to telephone visit type  Hearing/Vision  . Roberto Miller did not seem to have difficulty with hearing/understanding during the telephone conversation . Reports that he has had a formal eye exam by an eye care professional within the past year . Reports that he has not had a formal hearing evaluation within the past year *Unable to fully assess hearing and vision during telephone visit type  Cognitive Function: 6CIT Screen 09/06/2019 08/29/2018  What Year? 4 points 0 points  What month? 0 points 0 points  What time? 0 points 0 points  Count back from 20 0 points 0 points   Months in reverse 4 points 0 points  Repeat phrase 10 points 0 points  Total Score 18 0   (Normal:0-7, Significant for Dysfunction: >8)  Normal Cognitive Function Screening: No: he scored 18 on the screening-although an interpreter was used. Unsure if there is cognitive deficit or language barrier or the fact that he only has a 1st grade education. Recommend repeat MMSE if PCP feels it is necessary.   Immunization & Health Maintenance Record Immunization History  Administered Date(s) Administered  . Fluad Quad(high Dose 65+) 01/10/2019  . Influenza, High Dose Seasonal PF 03/06/2018  . Influenza,inj,Quad PF,6+ Mos 12/15/2012, 12/27/2013, 01/22/2016  . Pneumococcal Conjugate-13 07/04/2014  . Pneumococcal Polysaccharide-23 11/19/2010  . Tdap 09/19/2010    Health Maintenance  Topic Date Due  . COVID-19 Vaccine (1) Never done  . OPHTHALMOLOGY EXAM  11/24/2016  . FOOT EXAM  08/09/2018  . INFLUENZA VACCINE  10/21/2019  . HEMOGLOBIN A1C  01/08/2020  . TETANUS/TDAP  11/18/2020  . PNA vac Low Risk Adult  Completed       Assessment  This is a routine wellness examination for Roberto Miller.  Health Maintenance: Due or Overdue Health Maintenance Due  Topic Date Due  . COVID-19 Vaccine (1) Never done  . OPHTHALMOLOGY EXAM  11/24/2016  . FOOT EXAM  08/09/2018    Roberto Miller does not need a referral for Community Assistance: Care Management:   Enrolled with Demetrios Loll, RN Social Work:    no Prescription Assistance:  no Nutrition/Diabetes Education:  no   Plan:  Personalized Goals Goals Addressed   None    Personalized Health Maintenance & Screening Recommendations  Shingrix vaccine  Lung Cancer Screening Recommended: no (Low Dose CT Chest recommended if Age 65-80 years, 30 pack-year currently smoking OR have quit w/in past 15 years) Hepatitis C Screening recommended: no HIV Screening recommended: no  Advanced Directives: Written information was not prepared  per patient's request.  Referrals & Orders No orders of the defined types were placed in this encounter.   Follow-up Plan . Follow-up with [name], Elige Radon, MD as planned . Bring COVID vaccine card in for our records . Consider Shingrix vaccine at your next visit with your PCP  I have personally reviewed and noted the following in the patient's chart:   . Medical and social history . Use of alcohol, tobacco or illicit drugs  . Current medications and supplements . Functional ability and status . Nutritional status . Physical activity . Advanced directives . List of other physicians . Hospitalizations, surgeries, and ER visits in previous 12 months . Vitals . Screenings to include cognitive, depression, and falls . Referrals and appointments  In addition, I have reviewed and discussed with Roberto Miller certain preventive protocols, quality metrics, and best practice recommendations. A written personalized care plan for preventive services as well as general preventive health recommendations is available and can be mailed to the patient at his request.      Milas Hock, LPN  2/[id number]/4128

## 2019-09-06 NOTE — Patient Instructions (Signed)
Preventive Care 75 Years and Older, Male Preventive care refers to lifestyle choices and visits with your health care provider that can promote health and wellness. This includes:  A yearly physical exam. This is also called an annual well check.  Regular dental and eye exams.  Immunizations.  Screening for certain conditions.  Healthy lifestyle choices, such as diet and exercise. What can I expect for my preventive care visit? Physical exam Your health care provider will check:  Height and weight. These may be used to calculate body mass index (BMI), which is a measurement that tells if you are at a healthy weight.  Heart rate and blood pressure.  Your skin for abnormal spots. Counseling Your health care provider may ask you questions about:  Alcohol, tobacco, and drug use.  Emotional well-being.  Home and relationship well-being.  Sexual activity.  Eating habits.  History of falls.  Memory and ability to understand (cognition).  Work and work Statistician. What immunizations do I need?  Influenza (flu) vaccine  This is recommended every year. Tetanus, diphtheria, and pertussis (Tdap) vaccine  You may need a Td booster every 10 years. Varicella (chickenpox) vaccine  You may need this vaccine if you have not already been vaccinated. Zoster (shingles) vaccine  You may need this after age 50. Pneumococcal conjugate (PCV13) vaccine  One dose is recommended after age 24. Pneumococcal polysaccharide (PPSV23) vaccine  One dose is recommended after age 33. Measles, mumps, and rubella (MMR) vaccine  You may need at least one dose of MMR if you were born in 1957 or later. You may also need a second dose. Meningococcal conjugate (MenACWY) vaccine  You may need this if you have certain conditions. Hepatitis A vaccine  You may need this if you have certain conditions or if you travel or work in places where you may be exposed to hepatitis A. Hepatitis B vaccine   You may need this if you have certain conditions or if you travel or work in places where you may be exposed to hepatitis B. Haemophilus influenzae type b (Hib) vaccine  You may need this if you have certain conditions. You may receive vaccines as individual doses or as more than one vaccine together in one shot (combination vaccines). Talk with your health care provider about the risks and benefits of combination vaccines. What tests do I need? Blood tests  Lipid and cholesterol levels. These may be checked every 5 years, or more frequently depending on your overall health.  Hepatitis C test.  Hepatitis B test. Screening  Lung cancer screening. You may have this screening every year starting at age 74 if you have a 30-pack-year history of smoking and currently smoke or have quit within the past 15 years.  Colorectal cancer screening. All adults should have this screening starting at age 57 and continuing until age 54. Your health care provider may recommend screening at age 47 if you are at increased risk. You will have tests every 1-10 years, depending on your results and the type of screening test.  Prostate cancer screening. Recommendations will vary depending on your family history and other risks.  Diabetes screening. This is done by checking your blood sugar (glucose) after you have not eaten for a while (fasting). You may have this done every 1-3 years.  Abdominal aortic aneurysm (AAA) screening. You may need this if you are a current or former smoker.  Sexually transmitted disease (STD) testing. Follow these instructions at home: Eating and drinking  Eat  a diet that includes fresh fruits and vegetables, whole grains, lean protein, and low-fat dairy products. Limit your intake of foods with high amounts of sugar, saturated fats, and salt.  Take vitamin and mineral supplements as recommended by your health care provider.  Do not drink alcohol if your health care  provider tells you not to drink.  If you drink alcohol: ? Limit how much you have to 0-2 drinks a day. ? Be aware of how much alcohol is in your drink. In the [place] one drink equals one 12 oz bottle of beer (355 mL), one 5 oz glass of wine (148 mL), or one 1 oz glass of hard liquor (44 mL). Lifestyle  Take daily care of your teeth and gums.  Stay active. Exercise for at least 30 minutes on 5 or more days each week.  Do not use any products that contain nicotine or tobacco, such as cigarettes, e-cigarettes, and chewing tobacco. If you need help quitting, ask your health care provider.  If you are sexually active, practice safe sex. Use a condom or other form of protection to prevent STIs (sexually transmitted infections).  Talk with your health care provider about taking a low-dose aspirin or statin. What's next?  Visit your health care provider once a year for a well check visit.  Ask your health care provider how often you should have your eyes and teeth checked.  Stay up to date on all vaccines. This information is not intended to replace advice given to you by your health care provider. Make sure you discuss any questions you have with your health care provider. Document Revised: 03/02/2018 Document Reviewed: 03/02/2018 Elsevier Patient Education  2020 Elsevier Inc.  

## 2019-09-12 DIAGNOSIS — H40013 Open angle with borderline findings, low risk, bilateral: Secondary | ICD-10-CM | POA: Diagnosis not present

## 2019-10-03 ENCOUNTER — Ambulatory Visit: Payer: Medicare HMO | Admitting: *Deleted

## 2019-10-03 DIAGNOSIS — E1169 Type 2 diabetes mellitus with other specified complication: Secondary | ICD-10-CM

## 2019-10-03 DIAGNOSIS — E1159 Type 2 diabetes mellitus with other circulatory complications: Secondary | ICD-10-CM

## 2019-10-03 NOTE — Patient Instructions (Signed)
Visit Information  Goals Addressed              This Visit's Progress     Patient Stated   .  COMPLETED: "We want to get a blood pressure monitor so we can check his blood pressure regularly" (pt-stated)        Nurse Case Manager Clinical Goal(s): Over the next 7 days, patient's healthcare proxy (dtr [name]) will verbalize understanding of health plan coverage and options for obtaining electronic/digital bp monitor for home use   Interventions: discussed rationale and need for routine self health monitoring of bp with dtr/healthcare proxy who interpreted for patient/spouse; advised patient/proxy that CM team will investigate options and coverage for monitor      .  "We want to keep his blood sugar under control" (pt-stated)        CARE PLAN ENTRY (see longitudinal plan of care for additional care plan information)  Current Barriers:  . Chronic Disease Management support and education needs related to diabetes  Nurse Case Manager Clinical Goal(s):  Marland Kitchen Over the next 60 days, patient will continue to demonstrate compliance with diabetes management by taking medications as prescribed . Over the next 60 days, patient will continue to check blood sugar daily as directed  Interventions:  . Inter-disciplinary care team collaboration (see longitudinal plan of care) . Evaluation of current treatment plan related to diabetes and patient's adherence to plan as established by provider. . Chart reviewed including recent office notes and lab results . Discussed diabetes management with patient's daughter o No care needs at this time . Advised patient, providing education and rationale, to check cbg daily and record, calling [phone] for findings outside established parameters.   . Reviewed upcoming appointments: Dr Louanne Skye 10/09/19 . Previously provided patient's daughter with RN Care Manager contact information and encouraged to reach out as needed  Patient Self Care Activities:   . Performs ADL's independently . Performs IADL's independently  Please see past updates related to this goal by clicking on the "Past Updates" button in the selected goal          Patient verbalizes understanding of instructions provided today.   Follow-up Plan The care management team will reach out to the patient again over the next 60 days.  Next PCP appointment scheduled for: 10/09/19 with Dr [name]  Demetrios Loll, BSN, RN-BC Embedded Chronic Care Manager [facility] Care Management Direct Dial: [phone]

## 2019-10-03 NOTE — Chronic Care Management (AMB) (Signed)
  Chronic Care Management   Follow Up Note   10/03/2019 Name: Roberto Miller MRN: [id number] DOB: 12-07-1931  Referred by: [name], Elige Radon, MD Reason for referral : Chronic Care Management (RN follow up)   Roberto Miller is a 86 y.o. year old male who is a primary care patient of [name], Elige Radon, MD. The CCM team was consulted for assistance with chronic disease management and care coordination needs.    Review of patient status, including review of consultants reports, relevant laboratory and other test results, and collaboration with appropriate care team members and the patient's provider was performed as part of comprehensive patient evaluation and provision of chronic care management services.    SDOH (Social Determinants of Health) assessments performed: No See Care Plan activities for detailed interventions related to [facility])     Outpatient Encounter Medications as of 10/03/2019  Medication Sig  . glipiZIDE (GLUCOTROL) 10 MG tablet Take 1 tablet (10 mg total) by mouth 2 (two) times daily.  Marland Kitchen glucose blood (ONETOUCH VERIO) test strip Check blood sugar one time er day and prn   Dx E11.9  . latanoprost (XALATAN) 0.005 % ophthalmic solution   . lisinopril (ZESTRIL) 5 MG tablet Take 2 tablets (10 mg total) by mouth daily.  . metoprolol tartrate (LOPRESSOR) 25 MG tablet Take 3 tablets (75 mg total) by mouth 2 (two) times daily.  [name] DELICA LANCETS 33G MISC Check blood sugar 1x per day and prn   Dx E11.9  . sitaGLIPtin-metformin (JANUMET) 50-1000 MG tablet TAKE ONE TABLET TWICE A DAY WITH FOOD   No facility-administered encounter medications on file as of 10/03/2019.     RN Care Plan   Patient Stated   .  "We want to keep his blood sugar under control" (pt-stated)        CARE PLAN ENTRY (see longitudinal plan of care for additional care plan information)  Current Barriers:  . Chronic Disease Management support and education needs related to diabetes  Nurse Case  Manager Clinical Goal(s):  Marland Kitchen Over the next 60 days, patient will continue to demonstrate compliance with diabetes management by taking medications as prescribed . Over the next 60 days, patient will continue to check blood sugar daily as directed  Interventions:  . Inter-disciplinary care team collaboration (see longitudinal plan of care) . Evaluation of current treatment plan related to diabetes and patient's adherence to plan as established by provider. . Chart reviewed including recent office notes and lab results . Discussed diabetes management with patient's daughter o No care needs at this time . Advised patient, providing education and rationale, to check cbg daily and record, calling [phone] for findings outside established parameters.   . Reviewed upcoming appointments: Dr Louanne Skye 10/09/19 . Previously provided patient's daughter with RN Care Manager contact information and encouraged to reach out as needed  Patient Self Care Activities:  . Performs ADL's independently . Performs IADL's independently  Please see past updates related to this goal by clicking on the "Past Updates" button in the selected goal           Plan:   The care management team will reach out to the patient again over the next 60 days.  Follow up with PCP 10/09/19  Demetrios Loll, BSN, RN-BC Embedded Chronic Care Manager [facility] Care Management Direct Dial: [phone]

## 2019-10-09 ENCOUNTER — Ambulatory Visit: Payer: Medicare HMO | Admitting: Family Medicine

## 2019-10-12 ENCOUNTER — Encounter: Payer: Self-pay | Admitting: Family Medicine

## 2019-10-17 DIAGNOSIS — I10 Essential (primary) hypertension: Secondary | ICD-10-CM | POA: Diagnosis not present

## 2019-10-17 DIAGNOSIS — Z0100 Encounter for examination of eyes and vision without abnormal findings: Secondary | ICD-10-CM | POA: Diagnosis not present

## 2019-10-17 DIAGNOSIS — H5203 Hypermetropia, bilateral: Secondary | ICD-10-CM | POA: Diagnosis not present

## 2019-10-17 DIAGNOSIS — E139 Other specified diabetes mellitus without complications: Secondary | ICD-10-CM | POA: Diagnosis not present

## 2019-11-15 ENCOUNTER — Ambulatory Visit (INDEPENDENT_AMBULATORY_CARE_PROVIDER_SITE_OTHER): Payer: Medicare HMO | Admitting: Family Medicine

## 2019-11-15 ENCOUNTER — Encounter: Payer: Self-pay | Admitting: Family Medicine

## 2019-11-15 ENCOUNTER — Other Ambulatory Visit: Payer: Self-pay

## 2019-11-15 VITALS — BP 87/56 | HR 82 | Temp 98.2°F | Ht 62.0 in | Wt 145.0 lb

## 2019-11-15 DIAGNOSIS — E1159 Type 2 diabetes mellitus with other circulatory complications: Secondary | ICD-10-CM | POA: Diagnosis not present

## 2019-11-15 DIAGNOSIS — E1169 Type 2 diabetes mellitus with other specified complication: Secondary | ICD-10-CM

## 2019-11-15 DIAGNOSIS — I10 Essential (primary) hypertension: Secondary | ICD-10-CM

## 2019-11-15 LAB — BAYER DCA HB A1C WAIVED: HB A1C (BAYER DCA - WAIVED): 7.1 % — ABNORMAL HIGH (ref <7.0)

## 2019-11-15 MED ORDER — LISINOPRIL 5 MG PO TABS: 5.0000 mg | ORAL_TABLET | Freq: Every day | ORAL | 3 refills | Status: DC

## 2019-11-15 NOTE — Progress Notes (Signed)
BP (!) 87/56   Pulse 82   Temp 98.2 F (36.8 C)   Ht 5\' 2"  (1.575 m)   Wt 145 lb (65.8 kg)   SpO2 99%   BMI 26.52 kg/m    Subjective:   Patient ID: , male    DOB: 07-Aug-1931, 86 y.o.   MRN: [id number]  HPI: Roberto Miller is a 86 y.o. male presenting on 11/15/2019 for Medical Management of Chronic Issues and Diabetes   HPI Type 2 diabetes mellitus Patient comes in today for recheck of his diabetes. Patient has been currently taking Janumet and glipizide. Patient is currently on an ACE inhibitor/ARB. Patient has not seen an ophthalmologist this year. Patient denies any issues with their feet. The symptom started onset as an adult hypertension ARE RELATED TO DM   Hypertension Patient is currently on lisinopril and metoprolol, and their blood pressure today is 87/56. Patient denies any lightheadedness or dizziness. Patient denies headaches, blurred vision, chest pains, shortness of breath, or weakness. Denies any side effects from medication and is content with current medication.   Relevant past medical, surgical, family and social history reviewed and updated as indicated. Interim medical history since our last visit reviewed. Allergies and medications reviewed and updated.  Review of Systems  Constitutional: Negative for chills and fever.  Respiratory: Negative for shortness of breath and wheezing.   Cardiovascular: Negative for chest pain and leg swelling.  Musculoskeletal: Negative for back pain and gait problem.  Skin: Negative for rash.  Neurological: Negative for dizziness, weakness and light-headedness.  All other systems reviewed and are negative.   Per HPI unless specifically indicated above   Allergies as of 11/15/2019   No Known Allergies     Medication List       Accurate as of November 15, 2019  4:56 PM. If you have any questions, ask your nurse or doctor.        glipiZIDE 10 MG tablet Commonly known as: GLUCOTROL Take 1 tablet (10 mg  total) by mouth 2 (two) times daily.   glucose blood test strip Commonly known as: OneTouch Verio Check blood sugar one time er day and prn   Dx E11.9   Janumet 50-1000 MG tablet Generic drug: sitaGLIPtin-metformin TAKE ONE TABLET TWICE A DAY WITH FOOD   latanoprost 0.005 % ophthalmic solution Commonly known as: XALATAN   lisinopril 5 MG tablet Commonly known as: ZESTRIL Take 1 tablet (5 mg total) by mouth daily. What changed: how much to take Changed by: November 17, 2019 Jacori Mulrooney, MD   metoprolol tartrate 25 MG tablet Commonly known as: LOPRESSOR Take 3 tablets (75 mg total) by mouth 2 (two) times daily.   OneTouch Delica Lancets 33G Misc Check blood sugar 1x per day and prn   Dx E11.9        Objective:   BP (!) 87/56   Pulse 82   Temp 98.2 F (36.8 C)   Ht 5\' 2"  (1.575 m)   Wt 145 lb (65.8 kg)   SpO2 99%   BMI 26.52 kg/m   Wt Readings from Last 3 Encounters:  11/15/19 145 lb (65.8 kg)  07/09/19 148 lb (67.1 kg)  04/09/19 149 lb 12.8 oz (67.9 kg)    Physical Exam Vitals and nursing note reviewed.  Constitutional:      General: He is not in acute distress.    Appearance: He is well-developed. He is not diaphoretic.  Eyes:     General: No scleral icterus.  Conjunctiva/sclera: Conjunctivae normal.  Neck:     Thyroid: No thyromegaly.  Cardiovascular:     Rate and Rhythm: Normal rate and regular rhythm.     Heart sounds: Normal heart sounds. No murmur heard.   Pulmonary:     Effort: Pulmonary effort is normal. No respiratory distress.     Breath sounds: Normal breath sounds. No wheezing.  Musculoskeletal:        General: Normal range of motion.     Cervical back: Neck supple.  Lymphadenopathy:     Cervical: No cervical adenopathy.  Skin:    General: Skin is warm and dry.     Findings: No rash.  Neurological:     Mental Status: He is alert and oriented to person, place, and time.     Coordination: Coordination normal.  Psychiatric:        Behavior:  Behavior normal.       Assessment & Plan:   Problem List Items Addressed This Visit      Cardiovascular and Mediastinum   Hypertension associated with diabetes (HCC)   Relevant Medications   lisinopril (ZESTRIL) 5 MG tablet     Endocrine   Diabetes (HCC) - Primary   Relevant Medications   lisinopril (ZESTRIL) 5 MG tablet   Other Relevant Orders   Bayer DCA Hb A1c Waived      A1c 7.1 today, no changes, blood pressure slightly low so will back off on the lisinopril to just 1 a day and see how it goes.  Patient is asymptomatic.   Follow up plan: Return in about 3 months (around 02/15/2020), or if symptoms worsen or fail to improve, for Hypertension and diabetes.  Counseling provided for all of the vaccine components Orders Placed This Encounter  Procedures  . Bayer [facility] Hb A1c Waived    Arville Care, MD [facility] 11/15/2019, 4:56 PM

## 2020-01-17 ENCOUNTER — Other Ambulatory Visit: Payer: Self-pay | Admitting: Family Medicine

## 2020-01-17 DIAGNOSIS — E119 Type 2 diabetes mellitus without complications: Secondary | ICD-10-CM

## 2020-01-30 DIAGNOSIS — Z20828 Contact with and (suspected) exposure to other viral communicable diseases: Secondary | ICD-10-CM | POA: Diagnosis not present

## 2020-02-03 DIAGNOSIS — Z20822 Contact with and (suspected) exposure to covid-19: Secondary | ICD-10-CM | POA: Diagnosis not present

## 2020-02-20 ENCOUNTER — Encounter: Payer: Self-pay | Admitting: Family Medicine

## 2020-02-20 ENCOUNTER — Ambulatory Visit (INDEPENDENT_AMBULATORY_CARE_PROVIDER_SITE_OTHER): Payer: Medicare HMO | Admitting: Family Medicine

## 2020-02-20 ENCOUNTER — Other Ambulatory Visit: Payer: Self-pay

## 2020-02-20 VITALS — Ht 62.0 in

## 2020-02-20 DIAGNOSIS — E119 Type 2 diabetes mellitus without complications: Secondary | ICD-10-CM | POA: Diagnosis not present

## 2020-02-20 DIAGNOSIS — Z23 Encounter for immunization: Secondary | ICD-10-CM | POA: Diagnosis not present

## 2020-02-20 DIAGNOSIS — E1169 Type 2 diabetes mellitus with other specified complication: Secondary | ICD-10-CM

## 2020-02-20 DIAGNOSIS — I152 Hypertension secondary to endocrine disorders: Secondary | ICD-10-CM

## 2020-02-20 DIAGNOSIS — E1159 Type 2 diabetes mellitus with other circulatory complications: Secondary | ICD-10-CM

## 2020-02-20 LAB — BAYER DCA HB A1C WAIVED: HB A1C (BAYER DCA - WAIVED): 6.7 % (ref <7.0)

## 2020-02-20 MED ORDER — GLIPIZIDE 10 MG PO TABS: 10.0000 mg | ORAL_TABLET | Freq: Two times a day (BID) | ORAL | 3 refills | Status: DC

## 2020-02-20 MED ORDER — JANUMET 50-1000 MG PO TABS: 1.0000 | ORAL_TABLET | Freq: Two times a day (BID) | ORAL | 3 refills | Status: DC

## 2020-02-20 MED ORDER — METOPROLOL TARTRATE 25 MG PO TABS: 75.0000 mg | ORAL_TABLET | Freq: Two times a day (BID) | ORAL | 3 refills | Status: DC

## 2020-02-20 MED ORDER — ROSUVASTATIN CALCIUM 5 MG PO TABS: 5.0000 mg | ORAL_TABLET | Freq: Every day | ORAL | 3 refills | Status: DC

## 2020-02-20 NOTE — Progress Notes (Signed)
Ht 5' 2"  (1.575 m)   BMI 26.52 kg/m    Subjective:   Patient ID: Roberto Miller, male    DOB: 1932/02/11, 86 y.o.   MRN: [id number]  HPI: Roberto Miller is a 86 y.o. male presenting on 02/20/2020 for Medical Management of Chronic Issues and Diabetes   HPI Type 2 diabetes mellitus Patient comes in today for recheck of his diabetes. Patient has been currently taking glipizide and Janumet. Patient is currently on an ACE inhibitor/ARB. Patient has seen an ophthalmologist this year. Patient denies any issues with their feet. The symptom started onset as an adult hypertension ARE RELATED TO DM   Hypertension Patient is currently on lisinopril 5 and metoprolol, and their blood pressure today is 134/70. Patient denies any lightheadedness or dizziness. Patient denies headaches, blurred vision, chest pains, shortness of breath, or weakness. Denies any side effects from medication and is content with current medication.   Relevant past medical, surgical, family and social history reviewed and updated as indicated. Interim medical history since our last visit reviewed. Allergies and medications reviewed and updated.  Review of Systems  Constitutional: Negative for chills and fever.  Eyes: Negative for discharge.  Respiratory: Negative for shortness of breath and wheezing.   Cardiovascular: Negative for chest pain and leg swelling.  Musculoskeletal: Negative for back pain and gait problem.  Skin: Negative for rash.  Neurological: Negative for dizziness, weakness and numbness.  All other systems reviewed and are negative.   Per HPI unless specifically indicated above   Allergies as of 02/20/2020   No Known Allergies     Medication List       Accurate as of February 20, 2020  3:12 PM. If you have any questions, ask your nurse or doctor.        glipiZIDE 10 MG tablet Commonly known as: GLUCOTROL Take 1 tablet (10 mg total) by mouth 2 (two) times daily.   glucose blood test  strip Commonly known as: OneTouch Verio Check blood sugar one time er day and prn   Dx E11.9   Janumet 50-1000 MG tablet Generic drug: sitaGLIPtin-metformin TAKE ONE TABLET TWICE A DAY WITH FOOD   latanoprost 0.005 % ophthalmic solution Commonly known as: XALATAN   lisinopril 5 MG tablet Commonly known as: ZESTRIL Take 1 tablet (5 mg total) by mouth daily.   metoprolol tartrate 25 MG tablet Commonly known as: LOPRESSOR Take 3 tablets (75 mg total) by mouth 2 (two) times daily.   OneTouch Delica Lancets 71I Misc Check blood sugar 1x per day and prn   Dx E11.9        Objective:   Ht 5' 2"  (1.575 m)   BMI 26.52 kg/m   Wt Readings from Last 3 Encounters:  11/15/19 145 lb (65.8 kg)  07/09/19 148 lb (67.1 kg)  04/09/19 149 lb 12.8 oz (67.9 kg)    Physical Exam Vitals and nursing note reviewed.  Constitutional:      General: He is not in acute distress.    Appearance: He is well-developed. He is not diaphoretic.  Eyes:     General: No scleral icterus.    Conjunctiva/sclera: Conjunctivae normal.  Neck:     Thyroid: No thyromegaly.  Cardiovascular:     Rate and Rhythm: Normal rate and regular rhythm.     Heart sounds: Normal heart sounds. No murmur heard.   Pulmonary:     Effort: Pulmonary effort is normal. No respiratory distress.     Breath sounds: Normal breath  sounds. No wheezing.  Musculoskeletal:        General: Normal range of motion.     Cervical back: Neck supple.  Lymphadenopathy:     Cervical: No cervical adenopathy.  Skin:    General: Skin is warm and dry.     Findings: No rash.  Neurological:     Mental Status: He is alert and oriented to person, place, and time.     Coordination: Coordination normal.  Psychiatric:        Behavior: Behavior normal.     Results for orders placed or performed in visit on 11/15/19  Bayer DCA Hb A1c Waived  Result Value Ref Range   HB A1C (BAYER DCA - WAIVED) 7.1 (H) <7.0 %    Assessment & Plan:   Problem  List Items Addressed This Visit      Cardiovascular and Mediastinum   Hypertension associated with diabetes ([place])   Relevant Medications   sitaGLIPtin-metformin (JANUMET) 50-1000 MG tablet   glipiZIDE (GLUCOTROL) 10 MG tablet   metoprolol tartrate (LOPRESSOR) 25 MG tablet   Other Relevant Orders   CMP14+EGFR     Endocrine   Diabetes ([place]) - Primary   Relevant Medications   sitaGLIPtin-metformin (JANUMET) 50-1000 MG tablet   glipiZIDE (GLUCOTROL) 10 MG tablet   Other Relevant Orders   Lipid panel   Bayer DCA Hb A1c Waived   CMP14+EGFR   Lipid panel      A1c 6.7, no change  Patient's wife recently passed away from Covid but patient seems to be handling it well and keeping close to his family and his daughter and is church. Follow up plan: Return in about 3 months (around 05/20/2020), or if symptoms worsen or fail to improve, for Diabetes and hypertension.  Counseling provided for all of the vaccine components Orders Placed This Encounter  Procedures  . Bayer [facility] Hb A1c [place], MD [facility] 02/20/2020, 3:12 PM

## 2020-02-21 ENCOUNTER — Ambulatory Visit: Payer: Medicare HMO | Admitting: Family Medicine

## 2020-02-21 LAB — CMP14+EGFR
ALT: 11 IU/L (ref 0–44)
AST: 21 IU/L (ref 0–40)
Albumin/Globulin Ratio: 1.5 (ref 1.2–2.2)
Albumin: 4.3 g/dL (ref 3.6–4.6)
Alkaline Phosphatase: 81 IU/L (ref 44–121)
BUN/Creatinine Ratio: 16 (ref 10–24)
BUN: 16 mg/dL (ref 8–27)
Bilirubin Total: 0.3 mg/dL (ref 0.0–1.2)
CO2: 27 mmol/L (ref 20–29)
Calcium: 9.8 mg/dL (ref 8.6–10.2)
Chloride: 99 mmol/L (ref 96–106)
Creatinine, Ser: 1.01 mg/dL (ref 0.76–1.27)
GFR calc Af Amer: 77 mL/min/{1.73_m2} (ref >59)
GFR calc non Af Amer: 67 mL/min/{1.73_m2} (ref >59)
Globulin, Total: 2.9 g/dL (ref 1.5–4.5)
Glucose: 119 mg/dL — ABNORMAL HIGH (ref 65–99)
Potassium: 4.8 mmol/L (ref 3.5–5.2)
Sodium: 137 mmol/L (ref 134–144)
Total Protein: 7.2 g/dL (ref 6.0–8.5)

## 2020-02-21 LAB — LIPID PANEL
Chol/HDL Ratio: 3.2 ratio (ref 0.0–5.0)
Cholesterol, Total: 189 mg/dL (ref 100–199)
HDL: 60 mg/dL (ref >39)
LDL Chol Calc (NIH): 95 mg/dL (ref 0–99)
Triglycerides: 200 mg/dL — ABNORMAL HIGH (ref 0–149)
VLDL Cholesterol Cal: 34 mg/dL (ref 5–40)

## 2020-02-22 DIAGNOSIS — E1165 Type 2 diabetes mellitus with hyperglycemia: Secondary | ICD-10-CM | POA: Diagnosis not present

## 2020-02-22 DIAGNOSIS — I10 Essential (primary) hypertension: Secondary | ICD-10-CM | POA: Diagnosis not present

## 2020-02-22 DIAGNOSIS — Z7984 Long term (current) use of oral hypoglycemic drugs: Secondary | ICD-10-CM | POA: Diagnosis not present

## 2020-02-22 DIAGNOSIS — Z833 Family history of diabetes mellitus: Secondary | ICD-10-CM | POA: Diagnosis not present

## 2020-02-22 DIAGNOSIS — H409 Unspecified glaucoma: Secondary | ICD-10-CM | POA: Diagnosis not present

## 2020-02-22 DIAGNOSIS — Z87891 Personal history of nicotine dependence: Secondary | ICD-10-CM | POA: Diagnosis not present

## 2020-03-24 ENCOUNTER — Ambulatory Visit (INDEPENDENT_AMBULATORY_CARE_PROVIDER_SITE_OTHER): Payer: Medicare HMO | Admitting: Family Medicine

## 2020-03-24 ENCOUNTER — Encounter: Payer: Self-pay | Admitting: Family Medicine

## 2020-03-24 DIAGNOSIS — S335XXA Sprain of ligaments of lumbar spine, initial encounter: Secondary | ICD-10-CM | POA: Diagnosis not present

## 2020-03-24 DIAGNOSIS — L209 Atopic dermatitis, unspecified: Secondary | ICD-10-CM

## 2020-03-24 MED ORDER — HYDROCORTISONE 1 % EX OINT: 1.0000 "application " | TOPICAL_OINTMENT | Freq: Two times a day (BID) | CUTANEOUS | 1 refills | Status: DC

## 2020-03-24 MED ORDER — DICLOFENAC SODIUM 1 % EX GEL: 2.0000 g | Freq: Four times a day (QID) | CUTANEOUS | 3 refills | Status: AC

## 2020-03-24 MED ORDER — PREDNISONE 20 MG PO TABS: ORAL_TABLET | ORAL | 0 refills | Status: DC
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
## 2020-04-11 ENCOUNTER — Ambulatory Visit: Payer: Medicare HMO | Admitting: Family Medicine

## 2020-04-15 ENCOUNTER — Other Ambulatory Visit: Payer: Self-pay | Admitting: *Deleted

## 2020-04-15 MED ORDER — ACCU-CHEK GUIDE W/DEVICE KIT: PACK | 0 refills | Status: DC

## 2020-04-15 MED ORDER — ACCU-CHEK GUIDE VI STRP: ORAL_STRIP | 3 refills | Status: DC

## 2020-04-15 MED ORDER — ACCU-CHEK SOFTCLIX LANCETS MISC: 3 refills | Status: DC

## 2020-05-07 DIAGNOSIS — H5213 Myopia, bilateral: Secondary | ICD-10-CM | POA: Diagnosis not present

## 2020-05-07 DIAGNOSIS — H40013 Open angle with borderline findings, low risk, bilateral: Secondary | ICD-10-CM | POA: Diagnosis not present

## 2020-05-07 DIAGNOSIS — H524 Presbyopia: Secondary | ICD-10-CM | POA: Diagnosis not present

## 2020-05-07 DIAGNOSIS — H52223 Regular astigmatism, bilateral: Secondary | ICD-10-CM | POA: Diagnosis not present

## 2020-05-08 ENCOUNTER — Ambulatory Visit (INDEPENDENT_AMBULATORY_CARE_PROVIDER_SITE_OTHER): Payer: Medicare HMO | Admitting: Family Medicine

## 2020-05-08 ENCOUNTER — Encounter: Payer: Self-pay | Admitting: Family Medicine

## 2020-05-08 ENCOUNTER — Other Ambulatory Visit: Payer: Self-pay

## 2020-05-08 VITALS — BP 96/52 | HR 54 | Temp 96.8°F | Ht 62.0 in | Wt 148.6 lb

## 2020-05-08 DIAGNOSIS — Z0100 Encounter for examination of eyes and vision without abnormal findings: Secondary | ICD-10-CM | POA: Diagnosis not present

## 2020-05-08 DIAGNOSIS — S39012A Strain of muscle, fascia and tendon of lower back, initial encounter: Secondary | ICD-10-CM

## 2020-05-08 MED ORDER — KETOROLAC TROMETHAMINE 60 MG/2ML IM SOLN: 60.0000 mg | Freq: Once | INTRAMUSCULAR | Status: DC

## 2020-05-08 MED ORDER — ROSUVASTATIN CALCIUM 5 MG PO TABS
5.0000 mg | ORAL_TABLET | Freq: Every day | ORAL | 3 refills | Status: DC
Start: 2020-05-08 — End: 2020-05-29

## 2020-05-08 MED ORDER — KETOROLAC TROMETHAMINE 60 MG/2ML IM SOLN
60.0000 mg | Freq: Once | INTRAMUSCULAR | Status: AC
  Administered 2020-05-08: 60 mg via INTRAMUSCULAR
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
## 2020-05-22 ENCOUNTER — Encounter: Payer: Self-pay | Admitting: Family Medicine

## 2020-05-22 ENCOUNTER — Ambulatory Visit (INDEPENDENT_AMBULATORY_CARE_PROVIDER_SITE_OTHER): Payer: Medicare HMO | Admitting: Family Medicine

## 2020-05-22 ENCOUNTER — Other Ambulatory Visit: Payer: Self-pay

## 2020-05-22 VITALS — BP 99/56 | HR 60 | Ht 62.0 in | Wt 149.0 lb

## 2020-05-22 DIAGNOSIS — E1169 Type 2 diabetes mellitus with other specified complication: Secondary | ICD-10-CM

## 2020-05-22 DIAGNOSIS — M2041 Other hammer toe(s) (acquired), right foot: Secondary | ICD-10-CM | POA: Diagnosis not present

## 2020-05-22 DIAGNOSIS — E1159 Type 2 diabetes mellitus with other circulatory complications: Secondary | ICD-10-CM

## 2020-05-22 DIAGNOSIS — I152 Hypertension secondary to endocrine disorders: Secondary | ICD-10-CM

## 2020-05-22 DIAGNOSIS — M2042 Other hammer toe(s) (acquired), left foot: Secondary | ICD-10-CM

## 2020-05-22 DIAGNOSIS — L84 Corns and callosities: Secondary | ICD-10-CM | POA: Diagnosis not present

## 2020-05-22 DIAGNOSIS — L209 Atopic dermatitis, unspecified: Secondary | ICD-10-CM

## 2020-05-22 LAB — CBC WITH DIFFERENTIAL/PLATELET
Basophils Absolute: 0 10*3/uL (ref 0.0–0.2)
Basos: 0 %
EOS (ABSOLUTE): 0.1 10*3/uL (ref 0.0–0.4)
Eos: 2 %
Hematocrit: 32.9 % — ABNORMAL LOW (ref 37.5–51.0)
Hemoglobin: 10.9 g/dL — ABNORMAL LOW (ref 13.0–17.7)
Immature Grans (Abs): 0 10*3/uL (ref 0.0–0.1)
Immature Granulocytes: 0 %
Lymphocytes Absolute: 0.8 10*3/uL (ref 0.7–3.1)
Lymphs: 17 %
MCH: 29.4 pg (ref 26.6–33.0)
MCHC: 33.1 g/dL (ref 31.5–35.7)
MCV: 89 fL (ref 79–97)
Monocytes Absolute: 0.4 10*3/uL (ref 0.1–0.9)
Monocytes: 8 %
Neutrophils Absolute: 3.5 10*3/uL (ref 1.4–7.0)
Neutrophils: 73 %
Platelets: 171 10*3/uL (ref 150–450)
RBC: 3.71 x10E6/uL — ABNORMAL LOW (ref 4.14–5.80)
RDW: 13.3 % (ref 11.6–15.4)
WBC: 4.8 10*3/uL (ref 3.4–10.8)

## 2020-05-22 LAB — LIPID PANEL
Chol/HDL Ratio: 2.8 ratio (ref 0.0–5.0)
Cholesterol, Total: 163 mg/dL (ref 100–199)
HDL: 59 mg/dL (ref >39)
LDL Chol Calc (NIH): 87 mg/dL (ref 0–99)
Triglycerides: 90 mg/dL (ref 0–149)
VLDL Cholesterol Cal: 17 mg/dL (ref 5–40)

## 2020-05-22 LAB — CMP14+EGFR
ALT: 15 IU/L (ref 0–44)
AST: 21 IU/L (ref 0–40)
Albumin/Globulin Ratio: 1.8 (ref 1.2–2.2)
Albumin: 4.2 g/dL (ref 3.6–4.6)
Alkaline Phosphatase: 74 IU/L (ref 44–121)
BUN/Creatinine Ratio: 20 (ref 10–24)
BUN: 21 mg/dL (ref 8–27)
Bilirubin Total: 0.4 mg/dL (ref 0.0–1.2)
CO2: 19 mmol/L — ABNORMAL LOW (ref 20–29)
Calcium: 9.2 mg/dL (ref 8.6–10.2)
Chloride: 104 mmol/L (ref 96–106)
Creatinine, Ser: 1.05 mg/dL (ref 0.76–1.27)
Globulin, Total: 2.4 g/dL (ref 1.5–4.5)
Glucose: 205 mg/dL — ABNORMAL HIGH (ref 65–99)
Potassium: 5 mmol/L (ref 3.5–5.2)
Sodium: 138 mmol/L (ref 134–144)
Total Protein: 6.6 g/dL (ref 6.0–8.5)
eGFR: 69 mL/min/{1.73_m2} (ref >59)

## 2020-05-22 LAB — BAYER DCA HB A1C WAIVED: HB A1C (BAYER DCA - WAIVED): 7.5 % — ABNORMAL HIGH (ref <7.0)

## 2020-05-22 MED ORDER — GLIPIZIDE 10 MG PO TABS: 10.0000 mg | ORAL_TABLET | Freq: Every day | ORAL | 3 refills | Status: DC

## 2020-05-22 MED ORDER — HYDROCORTISONE 1 % EX OINT: 1.0000 "application " | TOPICAL_OINTMENT | Freq: Two times a day (BID) | CUTANEOUS | 1 refills | Status: AC

## 2020-05-22 MED ORDER — METOPROLOL TARTRATE 25 MG PO TABS: 25.0000 mg | ORAL_TABLET | Freq: Two times a day (BID) | ORAL | 3 refills | Status: DC

## 2020-05-22 NOTE — Progress Notes (Signed)
BP (!) 99/56   Pulse 60   Ht _0  (1.575 m)   Wt 149 lb (67.6 kg)   SpO2 98%   BMI 27.25 kg/m    Subjective:   Patient ID: Roberto Miller, male    DOB: 01-12-1932, 87 y.o.   MRN: [id number]  HPI: Roberto Miller is a 87 y.o. male presenting on 05/22/2020 for Medical Management of Chronic Issues and Diabetes   HPI Type 2 diabetes mellitus Patient comes in today for recheck of his diabetes. Patient has been currently taking Janumet and glipizide, A1c 7.5.. Patient is currently on an ACE inhibitor/ARB. Patient has not seen an ophthalmologist this year. Patient denies any new issues with their feet. The symptom started onset as an adult hypertension ARE RELATED TO DM   Hypertension Patient is currently on lisinopril and metoprolol, and their blood pressure today is 99/56. Patient denies any lightheadedness or dizziness. Patient denies headaches, blurred vision, chest pains, shortness of breath, or weakness. Denies any side effects from medication and is content with current medication.   Relevant past medical, surgical, family and social history reviewed and updated as indicated. Interim medical history since our last visit reviewed. Allergies and medications reviewed and updated.  Review of Systems  Constitutional: Negative for chills and fever.  Eyes: Negative for discharge.  Respiratory: Negative for shortness of breath and wheezing.   Cardiovascular: Negative for chest pain and leg swelling.  Musculoskeletal: Negative for back pain and gait problem.  Skin: Negative for rash.  All other systems reviewed and are negative.   Per HPI unless specifically indicated above   Allergies as of 05/22/2020   No Known Allergies     Medication List       Accurate as of May 22, 2020 11:14 AM. If you have any questions, ask your nurse or doctor.        Accu-Chek Guide test strip Generic drug: glucose blood Check blood sugar 1x per day and prn   Dx E11.9   Accu-Chek Guide  w/Device Kit Check blood sugar 1x per day and prn   Dx E11.9   Accu-Chek Softclix Lancets lancets Check blood sugar 1x per day and prn   Dx E11.9   diclofenac Sodium 1 % Gel Commonly known as: Voltaren Apply 2 g topically 4 (four) times daily.   glipiZIDE 10 MG tablet Commonly known as: GLUCOTROL Take 1 tablet (10 mg total) by mouth 2 (two) times daily.   hydrocortisone 1 % ointment Apply 1 application topically 2 (two) times daily.   Janumet 50-1000 MG tablet Generic drug: sitaGLIPtin-metformin Take 1 tablet by mouth 2 (two) times daily with a meal.   latanoprost 0.005 % ophthalmic solution Commonly known as: XALATAN   lisinopril 5 MG tablet Commonly known as: ZESTRIL Take 1 tablet (5 mg total) by mouth daily.   metoprolol tartrate 25 MG tablet Commonly known as: LOPRESSOR Take 3 tablets (75 mg total) by mouth 2 (two) times daily.   multivitamin capsule Take 1 capsule by mouth daily.   rosuvastatin 5 MG tablet Commonly known as: Crestor Take 1 tablet (5 mg total) by mouth daily.        Objective:   BP (!) 99/56   Pulse 60   Ht _1  (1.575 m)   Wt 149 lb (67.6 kg)   SpO2 98%   BMI 27.25 kg/m   Wt Readings from Last 3 Encounters:  05/22/20 149 lb (67.6 kg)  05/08/20 148 lb 9.6 oz (67.4 kg)  11/15/19 145 lb (65.8 kg)    Physical Exam Vitals and nursing note reviewed.  Constitutional:      General: He is not in acute distress.    Appearance: He is well-developed and well-nourished. He is not diaphoretic.  Eyes:     General: No scleral icterus.    Extraocular Movements: EOM normal.     Conjunctiva/sclera: Conjunctivae normal.  Neck:     Thyroid: No thyromegaly.  Cardiovascular:     Rate and Rhythm: Normal rate and regular rhythm.     Pulses: Intact distal pulses.     Heart sounds: Normal heart sounds. No murmur heard.   Pulmonary:     Effort: Pulmonary effort is normal. No respiratory distress.     Breath sounds: Normal breath sounds. No  wheezing.  Musculoskeletal:        General: No edema. Normal range of motion.     Cervical back: Neck supple.  Lymphadenopathy:     Cervical: No cervical adenopathy.  Skin:    General: Skin is warm and dry.     Findings: No rash.  Neurological:     Mental Status: He is alert and oriented to person, place, and time.     Coordination: Coordination normal.  Psychiatric:        Mood and Affect: Mood and affect normal.        Behavior: Behavior normal.     Diabetic Foot Exam - Simple   Simple Foot Form Diabetic Foot exam was performed with the following findings: Yes 05/22/2020 11:39 AM  Visual Inspection See comments: Yes Sensation Testing Intact to touch and monofilament testing bilaterally: Yes Pulse Check Posterior Tibialis and Dorsalis pulse intact bilaterally: Yes Comments Patient has calluses on the end of both of his middle toes and hammertoes on all of his second through fifth digits on both feet      Assessment & Plan:   Problem List Items Addressed This Visit      Cardiovascular and Mediastinum   Hypertension associated with diabetes ([place])   Relevant Medications   metoprolol tartrate (LOPRESSOR) 25 MG tablet   glipiZIDE (GLUCOTROL) 10 MG tablet   Other Relevant Orders   CBC with Differential/Platelet   CMP14+EGFR   Lipid panel   Bayer DCA Hb A1c Waived (Completed)     Endocrine   Diabetes ([place]) - Primary   Relevant Medications   glipiZIDE (GLUCOTROL) 10 MG tablet   Other Relevant Orders   CBC with Differential/Platelet   CMP14+EGFR   Lipid panel   Bayer DCA Hb A1c Waived (Completed)   For home use only DME Other see comment    Other Visit Diagnoses    Callus       Relevant Orders   For home use only DME Other see comment   Hammertoe, bilateral       Relevant Orders   For home use only DME Other see comment      Uses his cream for his rash that he gets sometimes on his feet that itches.  Also recommended Eucerin or CeraVe day  We will prescribe  diabetic shoes for hammertoes and callus to see if it helps.  Continue current medication, we just had the dosing wrong computer but he has been taking metoprolol 25 twice daily and glipizide just once a day and he needs to continue with that.  A1c is 7.5 which is slightly up but focus on diet and exercise for now. Follow up plan: Return in about 3 months (  around 08/22/2020), or if symptoms worsen or fail to improve, for Diabetes and hypertension recheck.  Counseling provided for all of the vaccine components Orders Placed This Encounter  Procedures  . CBC with Differential/Platelet  . CMP14+EGFR  . Lipid panel  . [facility] Hb A1c [place], MD [facility] 05/22/2020, 11:14 AM

## 2020-05-26 ENCOUNTER — Telehealth: Payer: Self-pay

## 2020-05-26 DIAGNOSIS — M545 Low back pain, unspecified: Secondary | ICD-10-CM

## 2020-05-26 NOTE — Telephone Encounter (Signed)
Dr. Louanne Skye,  Do you need him to schedule an appointment. He was here on 05/22/20

## 2020-05-26 NOTE — Telephone Encounter (Signed)
REFERRAL REQUEST Telephone Note  Have you been seen at our office for this problem? dauther said yes (Advise that they may need an appointment with their PCP before a referral can be done)  Reason for Referral: left side pain wtbs with orthopaedic Referral discussed with patient: daughter said yes Best contact number of patient for referral team:[phone]    Has patient been seen by a specialist for this issue before: no Patient provider preference for referral: na Patient location preference for referral: na   Patient notified that referrals can take up to a week or longer to process. If they haven't heard anything within a week they should call back and speak with the referral department.

## 2020-05-29 ENCOUNTER — Other Ambulatory Visit: Payer: Self-pay | Admitting: *Deleted

## 2020-05-29 DIAGNOSIS — E119 Type 2 diabetes mellitus without complications: Secondary | ICD-10-CM

## 2020-05-29 MED ORDER — LISINOPRIL 5 MG PO TABS: 5.0000 mg | ORAL_TABLET | Freq: Every day | ORAL | 2 refills | Status: DC

## 2020-05-29 MED ORDER — METOPROLOL TARTRATE 25 MG PO TABS: 25.0000 mg | ORAL_TABLET | Freq: Two times a day (BID) | ORAL | 2 refills | Status: DC

## 2020-05-29 MED ORDER — GLIPIZIDE 10 MG PO TABS: 10.0000 mg | ORAL_TABLET | Freq: Every day | ORAL | 2 refills | Status: DC

## 2020-05-29 MED ORDER — LATANOPROST 0.005 % OP SOLN
1.0000 [drp] | Freq: Every day | OPHTHALMIC | 1 refills | Status: AC
Start: 2020-05-29 — End: ?

## 2020-05-29 MED ORDER — ROSUVASTATIN CALCIUM 5 MG PO TABS: 5.0000 mg | ORAL_TABLET | Freq: Every day | ORAL | 2 refills | Status: DC

## 2020-05-29 MED ORDER — JANUMET 50-1000 MG PO TABS: 1.0000 | ORAL_TABLET | Freq: Two times a day (BID) | ORAL | 2 refills | Status: DC

## 2020-06-02 ENCOUNTER — Encounter: Payer: Self-pay | Admitting: Family Medicine

## 2020-06-02 ENCOUNTER — Other Ambulatory Visit: Payer: Self-pay

## 2020-06-02 ENCOUNTER — Ambulatory Visit (INDEPENDENT_AMBULATORY_CARE_PROVIDER_SITE_OTHER): Payer: Medicare HMO

## 2020-06-02 ENCOUNTER — Ambulatory Visit (INDEPENDENT_AMBULATORY_CARE_PROVIDER_SITE_OTHER): Payer: Medicare HMO | Admitting: Family Medicine

## 2020-06-02 VITALS — BP 139/67 | HR 68 | Ht 62.0 in | Wt 149.0 lb

## 2020-06-02 DIAGNOSIS — M545 Low back pain, unspecified: Secondary | ICD-10-CM | POA: Diagnosis not present

## 2020-06-02 DIAGNOSIS — M5416 Radiculopathy, lumbar region: Secondary | ICD-10-CM

## 2020-06-02 DIAGNOSIS — Z23 Encounter for immunization: Secondary | ICD-10-CM
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
## 2020-06-05 NOTE — Telephone Encounter (Signed)
Placed referral for the patient. 

## 2020-06-10 ENCOUNTER — Ambulatory Visit: Payer: Medicare HMO | Attending: Family Medicine | Admitting: Physical Therapy

## 2020-06-10 ENCOUNTER — Other Ambulatory Visit: Payer: Self-pay

## 2020-06-10 ENCOUNTER — Encounter: Payer: Self-pay | Admitting: Physical Therapy

## 2020-06-10 DIAGNOSIS — M6281 Muscle weakness (generalized): Secondary | ICD-10-CM | POA: Diagnosis not present

## 2020-06-10 DIAGNOSIS — M5442 Lumbago with sciatica, left side: Secondary | ICD-10-CM | POA: Diagnosis not present

## 2020-06-10 DIAGNOSIS — R293 Abnormal posture: Secondary | ICD-10-CM | POA: Insufficient documentation
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
## 2020-06-12 ENCOUNTER — Ambulatory Visit (INDEPENDENT_AMBULATORY_CARE_PROVIDER_SITE_OTHER): Payer: Medicare HMO | Admitting: Orthopaedic Surgery

## 2020-06-12 ENCOUNTER — Ambulatory Visit (INDEPENDENT_AMBULATORY_CARE_PROVIDER_SITE_OTHER): Payer: Medicare HMO

## 2020-06-12 ENCOUNTER — Other Ambulatory Visit: Payer: Self-pay

## 2020-06-12 DIAGNOSIS — M79605 Pain in left leg: Secondary | ICD-10-CM | POA: Diagnosis not present

## 2020-06-12 DIAGNOSIS — M5136 Other intervertebral disc degeneration, lumbar region: Secondary | ICD-10-CM | POA: Diagnosis not present

## 2020-06-12 MED ORDER — DICLOFENAC SODIUM 75 MG PO TBEC: 75.0000 mg | DELAYED_RELEASE_TABLET | Freq: Two times a day (BID) | ORAL | 1 refills | Status: DC
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
## 2020-06-18 ENCOUNTER — Ambulatory Visit: Payer: Medicare HMO | Admitting: Physical Therapy

## 2020-08-01 DIAGNOSIS — Z029 Encounter for administrative examinations, unspecified: Secondary | ICD-10-CM

## 2020-09-09 ENCOUNTER — Ambulatory Visit (INDEPENDENT_AMBULATORY_CARE_PROVIDER_SITE_OTHER): Payer: Medicare Other

## 2020-09-09 VITALS — Ht 62.0 in | Wt 149.0 lb

## 2020-09-09 DIAGNOSIS — Z0000 Encounter for general adult medical examination without abnormal findings: Secondary | ICD-10-CM

## 2020-09-09 NOTE — Progress Notes (Signed)
Subjective:   Roberto Miller is a 87 y.o. male who presents for Medicare Annual/Subsequent preventive examination.  Virtual Visit via Telephone Note  I connected with  [name] on 09/09/20 at  2:00 PM EDT by telephone and verified that I am speaking with the correct person using two identifiers.  Location: Patient: Home Provider: [facility] Persons participating in the virtual visit: patient, [place] Interpreter/Nurse Health Advisor   I discussed the limitations, risks, security and privacy concerns of performing an evaluation and management service by telephone and the availability of in person appointments. The patient expressed understanding and agreed to proceed.  Interactive audio and video telecommunications were attempted between this nurse and patient, however failed, due to patient having technical difficulties OR patient did not have access to video capability.  We continued and completed visit with audio only.  Some vital signs may be absent or patient reported.   Carlyann Placide E Yaslyn Cumby, LPN   Review of Systems     Cardiac Risk Factors include: advanced age (>61mn, >>45women);diabetes mellitus;male gender;sedentary lifestyle;hypertension     Objective:    Today's Vitals   09/09/20 1406  Weight: 149 lb (67.6 kg)  Height: 5' 2"  (1.575 m)   Body mass index is 27.25 kg/m.  Advanced Directives 09/09/2020 06/10/2020 09/06/2019 08/29/2018 04/01/2014  Does Patient Have a Medical Advance Directive? No No No Yes No  Type of Advance Directive - - - Living will;Healthcare Power of Attorney -  Does patient want to make changes to medical advance directive? - - - No - Patient declined -  [place] of [place]? - - - No - copy requested -  Would patient like information on creating a medical advance directive? No - Patient declined - No - Patient declined - Yes - Educational materials given    Current Medications (verified) Outpatient Encounter Medications  as of 09/09/2020  Medication Sig   Accu-Chek Softclix Lancets lancets Check blood sugar 1x per day and prn   Dx E11.9   Blood Glucose Monitoring Suppl (ACCU-CHEK GUIDE) w/Device KIT Check blood sugar 1x per day and prn   Dx E11.9   diclofenac (VOLTAREN) 75 MG EC tablet Take 1 tablet (75 mg total) by mouth 2 (two) times daily.   diclofenac Sodium (VOLTAREN) 1 % GEL Apply 2 g topically 4 (four) times daily.   glipiZIDE (GLUCOTROL) 10 MG tablet Take 1 tablet (10 mg total) by mouth daily before breakfast.   glucose blood (ACCU-CHEK GUIDE) test strip Check blood sugar 1x per day and prn   Dx E11.9   hydrocortisone 1 % ointment Apply 1 application topically 2 (two) times daily.   latanoprost (XALATAN) 0.005 % ophthalmic solution Place 1 drop into both eyes at bedtime.   lisinopril (ZESTRIL) 5 MG tablet Take 1 tablet (5 mg total) by mouth daily.   metoprolol tartrate (LOPRESSOR) 25 MG tablet Take 1 tablet (25 mg total) by mouth 2 (two) times daily.   Multiple Vitamin (MULTIVITAMIN) capsule Take 1 capsule by mouth daily.   rosuvastatin (CRESTOR) 5 MG tablet Take 1 tablet (5 mg total) by mouth daily.   sitaGLIPtin-metformin (JANUMET) 50-1000 MG tablet Take 1 tablet by mouth 2 (two) times daily with a meal.   No facility-administered encounter medications on file as of 09/09/2020.    Allergies (verified) Patient has no known allergies.   History: Past Medical History:  Diagnosis Date   Diabetes mellitus without complication ([facility]    Hyperlipidemia    Hypertension  Past Surgical History:  Procedure Laterality Date   EYE SURGERY Right    Family History  Problem Relation Age of Onset   Diabetes Sister    Diabetes Brother    Diabetes Sister    Social History   Socioeconomic History   Marital status: Married    Spouse name: Alyson Locket   Number of children: 9   Years of education: 1   Highest education level: 1st grade  Occupational History   Occupation: retired  Tobacco Use    Smoking status: Never   Smokeless tobacco: Never  Vaping Use   Vaping Use: Never used  Substance and Sexual Activity   Alcohol use: No   Drug use: No   Sexual activity: Not Currently  Other Topics Concern   Not on file  Social History Narrative   Living with daughter in [place] - speaks no English   Social Determinants of Health   Financial Resource Strain: Low Risk    Difficulty of Paying Living Expenses: Not hard at all  Food Insecurity: No Food Insecurity   Worried About Charity fundraiser in the Last Year: Never true   Arboriculturist in the Last Year: Never true  Transportation Needs: No Transportation Needs   Lack of Transportation (Medical): No   Lack of Transportation (Non-Medical): No  Physical Activity: Unknown   Days of Exercise per Week: 0 days   Minutes of Exercise per Session: Not on file  Stress: No Stress Concern Present   Feeling of Stress : Not at all  Social Connections: Socially Integrated   Frequency of Communication with Friends and Family: More than three times a week   Frequency of Social Gatherings with Friends and Family: More than three times a week   Attends Religious Services: More than 4 times per year   Active Member of [employer] or Organizations: Yes   Attends Music therapist: More than 4 times per year   Marital Status: Married    Tobacco Counseling Counseling given: Not Answered   Clinical Intake:  Pre-visit preparation completed: Yes  Pain : No/denies pain     BMI - recorded: 27.25 Nutritional Status: BMI 25 -29 Overweight Nutritional Risks: None Diabetes: Yes CBG done?: No Did pt. bring in CBG monitor from home?: No  How often do you need to have someone help you when you read instructions, pamphlets, or other written materials from your doctor or pharmacy?: 1 - Never  Nutrition Risk Assessment:  Has the patient had any N/V/D within the last 2 months?  No  Does the patient have any non-healing wounds?  No  Has the  patient had any unintentional weight loss or weight gain?  No   Diabetes:  Is the patient diabetic?  Yes  If diabetic, was a CBG obtained today?  No  Did the patient bring in their glucometer from home?  No  How often do you monitor your CBG's? Once daily fasting - 65 this morning per patient.   Financial Strains and Diabetes Management:  Are you having any financial strains with the device, your supplies or your medication? No .  Does the patient want to be seen by Chronic Care Management for management of their diabetes?  No  Would the patient like to be referred to a Nutritionist or for Diabetic Management?  No   Diabetic Exams:  Diabetic Eye Exam: Completed 11/01/2019.   Diabetic Foot Exam: Completed 05/22/20. Pt has been advised about the importance in completing  this exam. Pt is scheduled for diabetic foot exam on 09/15/20.    Interpreter Needed?: No  Information entered by :: Chryl Holten, LPN   Activities of Daily Living In your present state of health, do you have any difficulty performing the following activities: 09/09/2020  Hearing? N  Vision? N  Difficulty concentrating or making decisions? N  Walking or climbing stairs? Y  Dressing or bathing? N  Doing errands, shopping? N  Preparing Food and eating ? N  Using the Toilet? N  In the past six months, have you accidently leaked urine? N  Do you have problems with loss of bowel control? N  Managing your Medications? N  Managing your Finances? N  Housekeeping or managing your Housekeeping? N  Some recent data might be hidden    Patient Care Team: [name], Fransisca Kaufmann, MD as PCP - General (Family Medicine) Ilean China, RN as Case Manager  Indicate any recent Medical Services you may have received from other than [facility] providers in the past year (date may be approximate).     Assessment:   This is a routine wellness examination for [place].  Hearing/Vision screen Hearing Screening - Comments:: Denies  hearing difficulties  Vision Screening - Comments:: Wears eyeglasses - up to date with annual eye exam with [facility] in [place] and Dr Katy Fitch in [place] issues and exercise activities discussed: Current Exercise Habits: The patient does not participate in regular exercise at present, Exercise limited by: None identified   Goals Addressed             This Visit's Progress    Prevent falls   On track     Depression Screen PHQ 2/9 Scores 09/09/2020 06/02/2020 05/22/2020 05/08/2020 02/20/2020 11/15/2019 09/06/2019  PHQ - 2 Score 0 0 0 0 0 0 0    Fall Risk Fall Risk  09/09/2020 06/02/2020 05/22/2020 05/08/2020 02/20/2020  Falls in the past year? 0 0 0 0 0  Number falls in past yr: 0 - - - -  Injury with Fall? 0 - - - -  Risk for fall due to : Orthopedic patient;Impaired vision - - - -  Follow up Falls prevention discussed - - - -    FALL RISK PREVENTION PERTAINING TO THE HOME:  Any stairs in or around the home? Yes  If so, are there any without handrails? No  Home free of loose throw rugs in walkways, pet beds, electrical cords, etc? Yes  Adequate lighting in your home to reduce risk of falls? Yes   ASSISTIVE DEVICES UTILIZED TO PREVENT FALLS:  Life alert? No  Use of a cane, walker or w/c? No  Grab bars in the bathroom? Yes  Shower chair or bench in shower? Yes  Elevated toilet seat or a handicapped toilet? Yes   TIMED UP AND GO:  Was the test performed? No . Telephonic visit.  Cognitive Function: Normal cognitive status assessed by direct observation by this Nurse Health Advisor. No abnormalities found. Unable to complete this assessment - communication barrier. Patient reports no memory concerns.  MMSE - Mini Mental State Exam 05/11/2017  Not completed: Unable to complete     6CIT Screen 09/06/2019 08/29/2018  What Year? 4 points 0 points  What month? 0 points 0 points  What time? 0 points 0 points  Count back from 20 0 points 0 points  Months in reverse 4 points 0  points  Repeat phrase 10 points 0 points  Total Score 18 0  Immunizations Immunization History  Administered Date(s) Administered   Fluad Quad(high Dose 65+) 01/10/2019, 02/20/2020   Influenza, High Dose Seasonal PF 03/06/2018   Influenza,inj,Quad PF,6+ Mos 12/15/2012, 12/27/2013, 01/22/2016   Moderna Sars-Covid-2 Vaccination 01/23/2020   Pneumococcal Conjugate-13 07/04/2014   Pneumococcal Polysaccharide-23 11/19/2010   Tdap 09/19/2010   Zoster Recombinat (Shingrix) 06/02/2020    TDAP status: Up to date  Flu Vaccine status: Up to date  Pneumococcal vaccine status: Due, Education has been provided regarding the importance of this vaccine. Advised may receive this vaccine at local pharmacy or Health Dept. Aware to provide a copy of the vaccination record if obtained from local pharmacy or Health Dept. Verbalized acceptance and understanding.  Covid-19 vaccine status: Completed vaccines  Qualifies for Shingles Vaccine? Yes   Zostavax completed Yes   Shingrix Completed?: No.    Education has been provided regarding the importance of this vaccine. Patient has been advised to call insurance company to determine out of pocket expense if they have not yet received this vaccine. Advised may also receive vaccine at local pharmacy or Health Dept. Verbalized acceptance and understanding.  Screening Tests Health Maintenance  Topic Date Due   COVID-19 Vaccine (2 - Moderna series) 02/20/2020   Zoster Vaccines- Shingrix (2 of 2) 07/28/2020   INFLUENZA VACCINE  10/20/2020   OPHTHALMOLOGY EXAM  10/31/2020   TETANUS/TDAP  11/18/2020   HEMOGLOBIN A1C  11/22/2020   FOOT EXAM  05/22/2021   PNA vac Low Risk Adult  Completed   HPV VACCINES  Aged Out    Health Maintenance  Health Maintenance Due  Topic Date Due   COVID-19 Vaccine (2 - Moderna series) 02/20/2020   Zoster Vaccines- Shingrix (2 of 2) 07/28/2020    Colorectal cancer screening: No longer required.   Lung Cancer Screening:  (Low Dose CT Chest recommended if Age 51-80 years, 30 pack-year currently smoking OR have quit w/in 15years.) does not qualify.   Additional Screening:  Hepatitis C Screening: does not qualify  Vision Screening: Recommended annual ophthalmology exams for early detection of glaucoma and other disorders of the eye. Is the patient up to date with their annual eye exam?  Yes  Who is the provider or what is the name of the office in which the patient attends annual eye exams? [place] and [place] If pt is not established with a provider, would they like to be referred to a provider to establish care? No .   Dental Screening: Recommended annual dental exams for proper oral hygiene  Community Resource Referral / Chronic Care Management: CRR required this visit?  No   CCM required this visit?  No      Plan:     I have personally reviewed and noted the following in the patient's chart:   Medical and social history Use of alcohol, tobacco or illicit drugs  Current medications and supplements including opioid prescriptions. Patient is not currently taking opioid prescriptions. Functional ability and status Nutritional status Physical activity Advanced directives List of other physicians Hospitalizations, surgeries, and ER visits in previous 12 months Vitals Screenings to include cognitive, depression, and falls Referrals and appointments  In addition, I have reviewed and discussed with patient certain preventive protocols, quality metrics, and best practice recommendations. A written personalized care plan for preventive services as well as general preventive health recommendations were provided to patient.     Sandrea Hammond, LPN   9/38/1829   Nurse Notes: None

## 2020-09-09 NOTE — Patient Instructions (Signed)
[name] , Thank you for taking time to come for your Medicare Wellness Visit. I appreciate your ongoing commitment to your health goals. Please review the following plan we discussed and let me know if I can assist you in the future.   Screening recommendations/referrals: Colonoscopy: No longer required Recommended yearly ophthalmology/optometry visit for glaucoma screening and checkup Recommended yearly dental visit for hygiene and checkup  Vaccinations: Influenza vaccine: Done 02/20/2020 - Repeat annually Pneumococcal vaccine: Done 11/19/2010 & 07/04/2014 Tdap vaccine: Done 09/19/2010 - Repeat in 10 years Shingles vaccine: First dose done 06/02/20 - due for second dose   Covid-19: Done 01/23/2020 - has had other doses - we need dates  Advanced directives: Advance directive discussed with you today. Even though you declined this today, please call our office should you change your mind, and we can give you the proper paperwork for you to fill out.  Conditions/risks identified: Aim for 30 minutes of exercise or brisk walking each day, drink 6-8 glasses of water and eat lots of fruits and vegetables.  Next appointment: Follow up in one year for your annual wellness visit.   Preventive Care 7 Years and Older, Male  Preventive care refers to lifestyle choices and visits with your health care provider that can promote health and wellness. What does preventive care include? A yearly physical exam. This is also called an annual well check. Dental exams once or twice a year. Routine eye exams. Ask your health care provider how often you should have your eyes checked. Personal lifestyle choices, including: Daily care of your teeth and gums. Regular physical activity. Eating a healthy diet. Avoiding tobacco and drug use. Limiting alcohol use. Practicing safe sex. Taking low doses of aspirin every day. Taking vitamin and mineral supplements as recommended by your health care provider. What  happens during an annual well check? The services and screenings done by your health care provider during your annual well check will depend on your age, overall health, lifestyle risk factors, and family history of disease. Counseling  Your health care provider may ask you questions about your: Alcohol use. Tobacco use. Drug use. Emotional well-being. Home and relationship well-being. Sexual activity. Eating habits. History of falls. Memory and ability to understand (cognition). Work and work Astronomer. Screening  You may have the following tests or measurements: Height, weight, and BMI. Blood pressure. Lipid and cholesterol levels. These may be checked every 5 years, or more frequently if you are over 64 years old. Skin check. Lung cancer screening. You may have this screening every year starting at age 17 if you have a 30-pack-year history of smoking and currently smoke or have quit within the past 15 years. Fecal occult blood test (FOBT) of the stool. You may have this test every year starting at age 81. Flexible sigmoidoscopy or colonoscopy. You may have a sigmoidoscopy every 5 years or a colonoscopy every 10 years starting at age 76. Prostate cancer screening. Recommendations will vary depending on your family history and other risks. Hepatitis C blood test. Hepatitis B blood test. Sexually transmitted disease (STD) testing. Diabetes screening. This is done by checking your blood sugar (glucose) after you have not eaten for a while (fasting). You may have this done every 1-3 years. Abdominal aortic aneurysm (AAA) screening. You may need this if you are a current or former smoker. Osteoporosis. You may be screened starting at age 53 if you are at high risk. Talk with your health care provider about your test results, treatment  options, and if necessary, the need for more tests. Vaccines  Your health care provider may recommend certain vaccines, such as: Influenza vaccine. This  is recommended every year. Tetanus, diphtheria, and acellular pertussis (Tdap, Td) vaccine. You may need a Td booster every 10 years. Zoster vaccine. You may need this after age 48. Pneumococcal 13-valent conjugate (PCV13) vaccine. One dose is recommended after age 3. Pneumococcal polysaccharide (PPSV23) vaccine. One dose is recommended after age 39. Talk to your health care provider about which screenings and vaccines you need and how often you need them. This information is not intended to replace advice given to you by your health care provider. Make sure you discuss any questions you have with your health care provider. Document Released: 04/04/2015 Document Revised: 11/26/2015 Document Reviewed: 01/07/2015 Elsevier Interactive Patient Education  2017 [employer] Prevention in the Home Falls can cause injuries. They can happen to people of all ages. There are many things you can do to make your home safe and to help prevent falls. What can I do on the outside of my home? Regularly fix the edges of walkways and driveways and fix any cracks. Remove anything that might make you trip as you walk through a door, such as a raised step or threshold. Trim any bushes or trees on the path to your home. Use bright outdoor lighting. Clear any walking paths of anything that might make someone trip, such as rocks or tools. Regularly check to see if handrails are loose or broken. Make sure that both sides of any steps have handrails. Any raised decks and porches should have guardrails on the edges. Have any leaves, snow, or ice cleared regularly. Use sand or salt on walking paths during winter. Clean up any spills in your garage right away. This includes oil or grease spills. What can I do in the bathroom? Use night lights. Install grab bars by the toilet and in the tub and shower. Do not use towel bars as grab bars. Use non-skid mats or decals in the tub or shower. If you need to sit down  in the shower, use a plastic, non-slip stool. Keep the floor dry. Clean up any water that spills on the floor as soon as it happens. Remove soap buildup in the tub or shower regularly. Attach bath mats securely with double-sided non-slip rug tape. Do not have throw rugs and other things on the floor that can make you trip. What can I do in the bedroom? Use night lights. Make sure that you have a light by your bed that is easy to reach. Do not use any sheets or blankets that are too big for your bed. They should not hang down onto the floor. Have a firm chair that has side arms. You can use this for support while you get dressed. Do not have throw rugs and other things on the floor that can make you trip. What can I do in the kitchen? Clean up any spills right away. Avoid walking on wet floors. Keep items that you use a lot in easy-to-reach places. If you need to reach something above you, use a strong step stool that has a grab bar. Keep electrical cords out of the way. Do not use floor polish or wax that makes floors slippery. If you must use wax, use non-skid floor wax. Do not have throw rugs and other things on the floor that can make you trip. What can I do with my stairs? Do not  leave any items on the stairs. Make sure that there are handrails on both sides of the stairs and use them. Fix handrails that are broken or loose. Make sure that handrails are as long as the stairways. Check any carpeting to make sure that it is firmly attached to the stairs. Fix any carpet that is loose or worn. Avoid having throw rugs at the top or bottom of the stairs. If you do have throw rugs, attach them to the floor with carpet tape. Make sure that you have a light switch at the top of the stairs and the bottom of the stairs. If you do not have them, ask someone to add them for you. What else can I do to help prevent falls? Wear shoes that: Do not have high heels. Have rubber bottoms. Are comfortable  and fit you well. Are closed at the toe. Do not wear sandals. If you use a stepladder: Make sure that it is fully opened. Do not climb a closed stepladder. Make sure that both sides of the stepladder are locked into place. Ask someone to hold it for you, if possible. Clearly mark and make sure that you can see: Any grab bars or handrails. First and last steps. Where the edge of each step is. Use tools that help you move around (mobility aids) if they are needed. These include: Canes. Walkers. Scooters. Crutches. Turn on the lights when you go into a dark area. Replace any light bulbs as soon as they burn out. Set up your furniture so you have a clear path. Avoid moving your furniture around. If any of your floors are uneven, fix them. If there are any pets around you, be aware of where they are. Review your medicines with your doctor. Some medicines can make you feel dizzy. This can increase your chance of falling. Ask your doctor what other things that you can do to help prevent falls. This information is not intended to replace advice given to you by your health care provider. Make sure you discuss any questions you have with your health care provider. Document Released: 01/02/2009 Document Revised: 08/14/2015 Document Reviewed: 04/12/2014 Elsevier Interactive Patient Education  2017 [employer].

## 2020-09-15 ENCOUNTER — Ambulatory Visit (INDEPENDENT_AMBULATORY_CARE_PROVIDER_SITE_OTHER): Payer: Medicare Other | Admitting: Family Medicine

## 2020-09-15 ENCOUNTER — Other Ambulatory Visit: Payer: Self-pay

## 2020-09-15 ENCOUNTER — Encounter: Payer: Self-pay | Admitting: Family Medicine

## 2020-09-15 VITALS — BP 112/64 | HR 55 | Ht 62.0 in | Wt 143.0 lb

## 2020-09-15 DIAGNOSIS — D508 Other iron deficiency anemias: Secondary | ICD-10-CM

## 2020-09-15 DIAGNOSIS — E1159 Type 2 diabetes mellitus with other circulatory complications: Secondary | ICD-10-CM | POA: Diagnosis not present

## 2020-09-15 DIAGNOSIS — E1169 Type 2 diabetes mellitus with other specified complication: Secondary | ICD-10-CM | POA: Diagnosis not present

## 2020-09-15 DIAGNOSIS — Z23 Encounter for immunization: Secondary | ICD-10-CM

## 2020-09-15 DIAGNOSIS — I152 Hypertension secondary to endocrine disorders: Secondary | ICD-10-CM | POA: Diagnosis not present

## 2020-09-15 MED ORDER — FERROUS GLUCONATE 324 (38 FE) MG PO TABS: 324.0000 mg | ORAL_TABLET | Freq: Every day | ORAL | 3 refills | Status: DC

## 2020-09-15 MED ORDER — ROSUVASTATIN CALCIUM 5 MG PO TABS: 5.0000 mg | ORAL_TABLET | Freq: Every day | ORAL | 3 refills | Status: DC

## 2020-09-15 NOTE — Progress Notes (Signed)
BP 112/64   Pulse (!) 55   Ht 5' 2" (1.575 m)   Wt 143 lb (64.9 kg)   SpO2 96%   BMI 26.16 kg/m    Subjective:   Patient ID: Roberto Miller, male    DOB: Mar 23, 1931, 87 y.o.   MRN: [id number]  HPI: Roberto Miller is a 87 y.o. male presenting on 09/15/2020 for Medical Management of Chronic Issues, Hyperlipidemia, and Diabetes   HPI Type 2 diabetes mellitus Patient comes in today for recheck of his diabetes. Patient has been currently taking glipizide and Janumet. Patient is currently on an ACE inhibitor/ARB. Patient has not seen an ophthalmologist this year. Patient denies any issues with their feet. The symptom started onset as an adult hyperlipidemia ARE RELATED TO DM   Hyperlipidemia Patient is coming in for recheck of his hyperlipidemia. The patient is currently taking Crestor but did not bring with him and he does not know for sure if he actually has it or not.. They deny any issues with myalgias or history of liver damage from it. They deny any focal numbness or weakness or chest pain.   Anemia recheck Patient is coming in for anemia recheck.  He thinks he may have gotten iron but cannot recall and thinks it may have caused him iron.  He thinks he may have some diarrhea with the ferrous sulfate so we will switch to ferrous gluconate.  Relevant past medical, surgical, family and social history reviewed and updated as indicated. Interim medical history since our last visit reviewed. Allergies and medications reviewed and updated.  Review of Systems  Constitutional:  Negative for chills and fever.  Respiratory:  Negative for shortness of breath and wheezing.   Cardiovascular:  Negative for chest pain and leg swelling.  Musculoskeletal:  Negative for back pain and gait problem.  Skin:  Negative for rash.  Neurological:  Negative for dizziness, weakness and numbness.  All other systems reviewed and are negative.  Per HPI unless specifically indicated above   Allergies as  of 09/15/2020   No Known Allergies      Medication List        Accurate as of September 15, 2020  1:41 PM. If you have any questions, ask your nurse or doctor.          STOP taking these medications    ferrous sulfate 325 (65 FE) MG tablet Stopped by: Fransisca Kaufmann Makinna Andy, MD       TAKE these medications    Accu-Chek Guide test strip Generic drug: glucose blood Check blood sugar 1x per day and prn   Dx E11.9   Accu-Chek Guide w/Device Kit Check blood sugar 1x per day and prn   Dx E11.9   Accu-Chek Softclix Lancets lancets Check blood sugar 1x per day and prn   Dx E11.9   diclofenac 75 MG EC tablet Commonly known as: VOLTAREN Take 1 tablet (75 mg total) by mouth 2 (two) times daily.   diclofenac Sodium 1 % Gel Commonly known as: Voltaren Apply 2 g topically 4 (four) times daily.   ferrous gluconate 324 MG tablet Commonly known as: FERGON Take 1 tablet (324 mg total) by mouth daily. Started by: Fransisca Kaufmann Jenniah Bhavsar, MD   glipiZIDE 10 MG tablet Commonly known as: GLUCOTROL Take 1 tablet (10 mg total) by mouth daily before breakfast.   hydrocortisone 1 % ointment Apply 1 application topically 2 (two) times daily.   Janumet 50-1000 MG tablet Generic drug: sitaGLIPtin-metformin Take 1 tablet  by mouth 2 (two) times daily with a meal.   latanoprost 0.005 % ophthalmic solution Commonly known as: XALATAN Place 1 drop into both eyes at bedtime.   lisinopril 5 MG tablet Commonly known as: ZESTRIL Take 1 tablet (5 mg total) by mouth daily.   metoprolol tartrate 25 MG tablet Commonly known as: LOPRESSOR Take 1 tablet (25 mg total) by mouth 2 (two) times daily.   multivitamin capsule Take 1 capsule by mouth daily.   rosuvastatin 5 MG tablet Commonly known as: Crestor Take 1 tablet (5 mg total) by mouth daily.         Objective:   BP 112/64   Pulse (!) 55   Ht 5' 2" (1.575 m)   Wt 143 lb (64.9 kg)   SpO2 96%   BMI 26.16 kg/m   Wt Readings from Last 3  Encounters:  09/15/20 143 lb (64.9 kg)  09/09/20 149 lb (67.6 kg)  06/02/20 149 lb (67.6 kg)    Physical Exam Vitals and nursing note reviewed.  Constitutional:      General: He is not in acute distress.    Appearance: He is well-developed. He is not diaphoretic.  Eyes:     General: No scleral icterus.    Conjunctiva/sclera: Conjunctivae normal.  Neck:     Thyroid: No thyromegaly.  Cardiovascular:     Rate and Rhythm: Normal rate and regular rhythm.     Heart sounds: Normal heart sounds. No murmur heard. Pulmonary:     Effort: Pulmonary effort is normal. No respiratory distress.     Breath sounds: Normal breath sounds. No wheezing.  Musculoskeletal:        General: No swelling. Normal range of motion.     Cervical back: Neck supple.  Lymphadenopathy:     Cervical: No cervical adenopathy.  Skin:    General: Skin is warm and dry.     Findings: No rash.  Neurological:     Mental Status: He is alert and oriented to person, place, and time.     Coordination: Coordination normal.  Psychiatric:        Behavior: Behavior normal.      Assessment & Plan:   Problem List Items Addressed This Visit       Cardiovascular and Mediastinum   Hypertension associated with diabetes ([place])   Relevant Medications   rosuvastatin (CRESTOR) 5 MG tablet     Endocrine   Diabetes ([facility]) - Primary   Relevant Medications   rosuvastatin (CRESTOR) 5 MG tablet   Other Relevant Orders   Bayer DCA Hb A1c Waived     Other   Iron deficiency anemia   Relevant Medications   ferrous gluconate (FERGON) 324 MG tablet   Other Visit Diagnoses     Need for shingles vaccine       Relevant Orders   Varicella-zoster vaccine IM (Shingrix)     Continue current medications.  Blood pressure looks good.  Switched from ferrous sulfate to ferrous gluconate and refilled his Crestor.   Follow up plan: Return in about 3 months (around 12/16/2020), or if symptoms worsen or fail to improve, for Diabetes and  hypertension recheck.  Counseling provided for all of the vaccine components Orders Placed This Encounter  Procedures   Varicella-zoster vaccine IM (Shingrix)   Bayer DCA Hb A1c Drummond, MD [facility] 09/15/2020, 1:41 PM

## 2020-09-16 LAB — BAYER DCA HB A1C WAIVED: HB A1C (BAYER DCA - WAIVED): 7.9 % — ABNORMAL HIGH (ref <7.0)

## 2020-11-03 ENCOUNTER — Ambulatory Visit (INDEPENDENT_AMBULATORY_CARE_PROVIDER_SITE_OTHER): Payer: Medicare Other | Admitting: Family Medicine

## 2020-11-03 ENCOUNTER — Other Ambulatory Visit: Payer: Self-pay

## 2020-11-03 ENCOUNTER — Encounter: Payer: Self-pay | Admitting: Family Medicine

## 2020-11-03 VITALS — BP 134/71 | HR 55 | Ht 63.0 in | Wt 138.0 lb

## 2020-11-03 DIAGNOSIS — M5136 Other intervertebral disc degeneration, lumbar region: Secondary | ICD-10-CM | POA: Diagnosis not present

## 2020-11-03 DIAGNOSIS — M5416 Radiculopathy, lumbar region: Secondary | ICD-10-CM | POA: Diagnosis not present

## 2020-11-03 MED ORDER — METHYLPREDNISOLONE ACETATE 40 MG/ML IJ SUSP
80.0000 mg | Freq: Once | INTRAMUSCULAR | Status: AC
  Administered 2020-11-03: 80 mg via INTRAMUSCULAR

## 2020-11-03 NOTE — Progress Notes (Signed)
BP 134/71   Pulse (!) 55   Ht 5' 3"  (1.6 m)   Wt 138 lb (62.6 kg)   SpO2 97%   BMI 24.45 kg/m    Subjective:   Patient ID: Roberto Miller, male    DOB: May 29, 1931, 87 y.o.   MRN: [id number]  HPI: Roberto Miller is a 87 y.o. male presenting on 11/03/2020 for Hip Pain and Leg Pain (Diclofenac pill/cream does not always help)   HPI Patient is coming in with complaints of pain in his left lower back and hip and going down his left leg.  This is been going off and on since March and has been persistent and he is done now physical therapy and seeing an orthopedic and have x-rays and has been doing oral and topical Voltaren.  He says they are just not helping and is not getting any better.  The pain still goes from his left lower back/buttock region around his left leg and all the way down the outside of his left leg.  He denies any numbness or weakness  Relevant past medical, surgical, family and social history reviewed and updated as indicated. Interim medical history since our last visit reviewed. Allergies and medications reviewed and updated.  Review of Systems  Constitutional:  Negative for chills and fever.  Eyes:  Negative for visual disturbance.  Respiratory:  Negative for shortness of breath and wheezing.   Cardiovascular:  Negative for chest pain and leg swelling.  Musculoskeletal:  Positive for arthralgias, back pain and myalgias. Negative for gait problem.  Skin:  Negative for rash.  All other systems reviewed and are negative.  Per HPI unless specifically indicated above   Allergies as of 11/03/2020   No Known Allergies      Medication List        Accurate as of November 03, 2020 10:35 AM. If you have any questions, ask your nurse or doctor.          Accu-Chek Guide test strip Generic drug: glucose blood Check blood sugar 1x per day and prn   Dx E11.9   Accu-Chek Guide w/Device Kit Check blood sugar 1x per day and prn   Dx E11.9   Accu-Chek Softclix  Lancets lancets Check blood sugar 1x per day and prn   Dx E11.9   diclofenac 75 MG EC tablet Commonly known as: VOLTAREN Take 1 tablet (75 mg total) by mouth 2 (two) times daily.   diclofenac Sodium 1 % Gel Commonly known as: Voltaren Apply 2 g topically 4 (four) times daily.   ferrous gluconate 324 MG tablet Commonly known as: FERGON Take 1 tablet (324 mg total) by mouth daily.   glipiZIDE 10 MG tablet Commonly known as: GLUCOTROL Take 1 tablet (10 mg total) by mouth daily before breakfast.   hydrocortisone 1 % ointment Apply 1 application topically 2 (two) times daily.   Janumet 50-1000 MG tablet Generic drug: sitaGLIPtin-metformin Take 1 tablet by mouth 2 (two) times daily with a meal.   latanoprost 0.005 % ophthalmic solution Commonly known as: XALATAN Place 1 drop into both eyes at bedtime.   lisinopril 5 MG tablet Commonly known as: ZESTRIL Take 1 tablet (5 mg total) by mouth daily.   metoprolol tartrate 25 MG tablet Commonly known as: LOPRESSOR Take 1 tablet (25 mg total) by mouth 2 (two) times daily.   multivitamin capsule Take 1 capsule by mouth daily.   rosuvastatin 5 MG tablet Commonly known as: Crestor Take 1 tablet (5 mg  total) by mouth daily.         Objective:   BP 134/71   Pulse (!) 55   Ht 5' 3"  (1.6 m)   Wt 138 lb (62.6 kg)   SpO2 97%   BMI 24.45 kg/m   Wt Readings from Last 3 Encounters:  11/03/20 138 lb (62.6 kg)  09/15/20 143 lb (64.9 kg)  09/09/20 149 lb (67.6 kg)    Physical Exam Vitals and nursing note reviewed.  Constitutional:      General: He is not in acute distress.    Appearance: He is well-developed. He is not diaphoretic.  Eyes:     General: No scleral icterus.    Conjunctiva/sclera: Conjunctivae normal.  Neck:     Thyroid: No thyromegaly.  Musculoskeletal:        General: Normal range of motion.     Lumbar back: Tenderness (Left lower lumbar tenderness) present. No bony tenderness. Negative right straight leg  raise test and negative left straight leg raise test.  Skin:    General: Skin is warm and dry.     Findings: No rash.  Neurological:     Mental Status: He is alert and oriented to person, place, and time.     Coordination: Coordination normal.  Psychiatric:        Behavior: Behavior normal.    Results for orders placed or performed in visit on 09/15/20  Bayer DCA Hb A1c Waived  Result Value Ref Range   HB A1C (BAYER DCA - WAIVED) 7.9 (H) <7.0 %    Assessment & Plan:   Problem List Items Addressed This Visit   None Visit Diagnoses     Lumbar back pain with radiculopathy affecting left lower extremity    -  Primary   Relevant Medications   methylPREDNISolone acetate (DEPO-MEDROL) injection 80 mg (Start on 11/03/2020 10:45 AM)   Degenerative disc disease, lumbar       Relevant Medications   methylPREDNISolone acetate (DEPO-MEDROL) injection 80 mg (Start on 11/03/2020 10:45 AM)       Patient has continued low back pain, this started in March of this year so its been going on about 5 months and he has tried physical therapy and seen an orthopedic and had x-rays which showed degenerative disease but continues to have pain also despite using Voltaren and Voltaren cream.  Will order MRI Follow up plan: Return if symptoms worsen or fail to improve.  Counseling provided for all of the vaccine components No orders of the defined types were placed in this encounter.   Caryl Pina, MD [facility] 11/03/2020, 10:35 AM

## 2020-11-05 ENCOUNTER — Telehealth: Payer: Self-pay | Admitting: Family Medicine

## 2020-11-05 DIAGNOSIS — M5136 Other intervertebral disc degeneration, lumbar region: Secondary | ICD-10-CM

## 2020-11-05 DIAGNOSIS — M5416 Radiculopathy, lumbar region: Secondary | ICD-10-CM

## 2020-11-17 ENCOUNTER — Ambulatory Visit (HOSPITAL_COMMUNITY)
Admission: RE | Admit: 2020-11-17 | Discharge: 2020-11-17 | Disposition: A | Discharge status: Home / Self Care | Payer: Medicare Other | Source: Ambulatory Visit | Attending: Family Medicine | Admitting: Family Medicine

## 2020-11-17 ENCOUNTER — Other Ambulatory Visit: Payer: Self-pay

## 2020-11-17 DIAGNOSIS — M5136 Other intervertebral disc degeneration, lumbar region: Secondary | ICD-10-CM | POA: Diagnosis not present

## 2020-11-17 DIAGNOSIS — M545 Low back pain, unspecified: Secondary | ICD-10-CM | POA: Diagnosis not present

## 2020-11-17 DIAGNOSIS — M5416 Radiculopathy, lumbar region: Secondary | ICD-10-CM | POA: Insufficient documentation

## 2020-11-21 NOTE — Telephone Encounter (Signed)
Daughter Suzette Battiest called pt wants to try the injections first.

## 2020-11-27 ENCOUNTER — Telehealth: Payer: Self-pay | Admitting: Family Medicine

## 2020-11-27 NOTE — Telephone Encounter (Signed)
Daughter informed to continue to take Janumet twice daily and Glipizide once daily in the morning.  Informed that the [facility] referral was placed yesterday and to give them at leasts 7 business days to call with an appt. She understood and had no further concerns.

## 2020-12-04 ENCOUNTER — Ambulatory Visit: Payer: Medicare Other | Admitting: Family Medicine

## 2020-12-17 ENCOUNTER — Encounter: Payer: Self-pay | Admitting: Family Medicine

## 2020-12-17 ENCOUNTER — Ambulatory Visit (INDEPENDENT_AMBULATORY_CARE_PROVIDER_SITE_OTHER): Payer: Medicare Other | Admitting: Family Medicine

## 2020-12-17 ENCOUNTER — Ambulatory Visit: Payer: Medicare Other | Admitting: Family Medicine

## 2020-12-17 ENCOUNTER — Other Ambulatory Visit: Payer: Self-pay

## 2020-12-17 VITALS — BP 127/67 | HR 58 | Ht 63.0 in | Wt 138.0 lb

## 2020-12-17 DIAGNOSIS — E1169 Type 2 diabetes mellitus with other specified complication: Secondary | ICD-10-CM

## 2020-12-17 DIAGNOSIS — Z23 Encounter for immunization: Secondary | ICD-10-CM

## 2020-12-17 DIAGNOSIS — E1159 Type 2 diabetes mellitus with other circulatory complications: Secondary | ICD-10-CM | POA: Diagnosis not present

## 2020-12-17 DIAGNOSIS — D508 Other iron deficiency anemias: Secondary | ICD-10-CM

## 2020-12-17 DIAGNOSIS — I152 Hypertension secondary to endocrine disorders: Secondary | ICD-10-CM

## 2020-12-17 LAB — BAYER DCA HB A1C WAIVED: HB A1C (BAYER DCA - WAIVED): 9.9 % — ABNORMAL HIGH (ref 4.8–5.6)

## 2020-12-17 MED ORDER — EMPAGLIFLOZIN 25 MG PO TABS: 25.0000 mg | ORAL_TABLET | Freq: Every day | ORAL | 3 refills | Status: DC

## 2020-12-17 NOTE — Progress Notes (Signed)
BP 127/67   Pulse (!) 58   Ht 5' 3" (1.6 m)   Wt 138 lb (62.6 kg)   SpO2 99%   BMI 24.45 kg/m    Subjective:   Patient ID: Roberto Miller, male    DOB: 08-23-1931, 88 y.o.   MRN: [id number]  HPI: Roberto Miller is a 87 y.o. male presenting on 12/17/2020 for Medical Management of Chronic Issues and Diabetes   HPI Type 2 diabetes mellitus Patient comes in today for recheck of his diabetes. Patient has been currently taking metformin and glipizide and Janumet, A1c is 9.9. Patient is currently on an ACE inhibitor/ARB. Patient has not seen an ophthalmologist this year. Patient denies any issues with their feet. The symptom started onset as an adult hypertension ARE RELATED TO DM   Hypertension Patient is currently on lisinopril, has not taken his metoprolol, and their blood pressure today is 127/67 with heart rate of 58.. Patient denies any lightheadedness or dizziness. Patient denies headaches, blurred vision, chest pains, shortness of breath, or weakness. Denies any side effects from medication and is content with current medication.   Anemia recheck Patient has been diagnosed with anemia that with thought to be iron deficient anemia based on labs, he is taking iron.  We will recheck today.  Denies any major symptoms such as lightheadedness dizziness or chest pain.  Relevant past medical, surgical, family and social history reviewed and updated as indicated. Interim medical history since our last visit reviewed. Allergies and medications reviewed and updated.  Review of Systems  Constitutional:  Negative for chills and fever.  Eyes:  Negative for pain and discharge.  Respiratory:  Negative for cough, shortness of breath and wheezing.   Cardiovascular:  Negative for chest pain, palpitations and leg swelling.  Gastrointestinal:  Negative for abdominal pain, blood in stool, constipation and diarrhea.  Genitourinary:  Negative for dysuria and hematuria.  Musculoskeletal:  Negative  for back pain, gait problem and myalgias.  Skin:  Negative for rash.  Neurological:  Negative for dizziness, weakness and headaches.  Psychiatric/Behavioral:  Negative for suicidal ideas.   All other systems reviewed and are negative.  Per HPI unless specifically indicated above   Allergies as of 12/17/2020   No Known Allergies      Medication List        Accurate as of December 17, 2020  4:09 PM. If you have any questions, ask your nurse or doctor.          STOP taking these medications    metoprolol tartrate 25 MG tablet Commonly known as: LOPRESSOR Stopped by: Fransisca Kaufmann Matea Stanard, MD       TAKE these medications    Accu-Chek Guide test strip Generic drug: glucose blood Check blood sugar 1x per day and prn   Dx E11.9   Accu-Chek Guide w/Device Kit Check blood sugar 1x per day and prn   Dx E11.9   Accu-Chek Softclix Lancets lancets Check blood sugar 1x per day and prn   Dx E11.9   diclofenac 75 MG EC tablet Commonly known as: VOLTAREN Take 1 tablet (75 mg total) by mouth 2 (two) times daily.   diclofenac Sodium 1 % Gel Commonly known as: Voltaren Apply 2 g topically 4 (four) times daily.   empagliflozin 25 MG Tabs tablet Commonly known as: Jardiance Take 1 tablet (25 mg total) by mouth daily before breakfast. Started by: Fransisca Kaufmann Tequia Wolman, MD   ferrous gluconate 324 MG tablet Commonly known as: FERGON Take  1 tablet (324 mg total) by mouth daily.   glipiZIDE 10 MG tablet Commonly known as: GLUCOTROL Take 1 tablet (10 mg total) by mouth daily before breakfast.   hydrocortisone 1 % ointment Apply 1 application topically 2 (two) times daily.   Janumet 50-1000 MG tablet Generic drug: sitaGLIPtin-metformin Take 1 tablet by mouth 2 (two) times daily with a meal.   latanoprost 0.005 % ophthalmic solution Commonly known as: XALATAN Place 1 drop into both eyes at bedtime.   lisinopril 5 MG tablet Commonly known as: ZESTRIL Take 1 tablet (5 mg  total) by mouth daily.   multivitamin capsule Take 1 capsule by mouth daily.   rosuvastatin 5 MG tablet Commonly known as: Crestor Take 1 tablet (5 mg total) by mouth daily.         Objective:   BP 127/67   Pulse (!) 58   Ht 5' 3" (1.6 m)   Wt 138 lb (62.6 kg)   SpO2 99%   BMI 24.45 kg/m   Wt Readings from Last 3 Encounters:  12/17/20 138 lb (62.6 kg)  11/03/20 138 lb (62.6 kg)  09/15/20 143 lb (64.9 kg)    Physical Exam Vitals and nursing note reviewed.  Constitutional:      General: He is not in acute distress.    Appearance: He is well-developed. He is not diaphoretic.  Eyes:     General: No scleral icterus.       Right eye: No discharge.     Conjunctiva/sclera: Conjunctivae normal.     Pupils: Pupils are equal, round, and reactive to light.  Neck:     Thyroid: No thyromegaly.  Cardiovascular:     Rate and Rhythm: Normal rate and regular rhythm.     Heart sounds: Normal heart sounds. No murmur heard. Pulmonary:     Effort: Pulmonary effort is normal. No respiratory distress.     Breath sounds: Normal breath sounds. No wheezing.  Musculoskeletal:        General: Normal range of motion.     Cervical back: Neck supple.  Lymphadenopathy:     Cervical: No cervical adenopathy.  Skin:    General: Skin is warm and dry.     Findings: No rash.  Neurological:     Mental Status: He is alert and oriented to person, place, and time.     Coordination: Coordination normal.  Psychiatric:        Behavior: Behavior normal.      Assessment & Plan:   Problem List Items Addressed This Visit       Cardiovascular and Mediastinum   Hypertension associated with diabetes ([place])   Relevant Medications   empagliflozin (JARDIANCE) 25 MG TABS tablet   Other Relevant Orders   CBC with Differential/Platelet   CMP14+EGFR   Lipid panel   PSA, total and free   Bayer DCA Hb A1c Waived     Endocrine   Diabetes ([place]) - Primary   Relevant Medications   empagliflozin  (JARDIANCE) 25 MG TABS tablet   Other Relevant Orders   CBC with Differential/Platelet   CMP14+EGFR   Lipid panel   PSA, total and free   Bayer DCA Hb A1c Waived   AMB Referral to [place]     Other   Iron deficiency anemia    Patient's A1c is up a lot at 9.9, will add Jardiance and focus on diet, will also place referral to Lottie Dawson our clinical pharmacist to help with assistance and education,  patient did not want to do an injection at this point Follow up plan: Return in about 3 months (around 03/18/2021), or if symptoms worsen or fail to improve, for Diabetes and hypertension.  Counseling provided for all of the vaccine components Orders Placed This Encounter  Procedures   CBC with Differential/Platelet   CMP14+EGFR   Lipid panel   PSA, total and free   Bayer DCA Hb A1c Waived   AMB Referral to Tolstoy [name], MD [facility] 12/17/2020, 4:09 PM

## 2020-12-17 NOTE — Addendum Note (Signed)
Addended by: Dorene Sorrow on: 12/17/2020 04:46 PM   Modules accepted: Orders

## 2020-12-18 LAB — CBC WITH DIFFERENTIAL/PLATELET
Basophils Absolute: 0 10*3/uL (ref 0.0–0.2)
Basos: 1 %
EOS (ABSOLUTE): 0.1 10*3/uL (ref 0.0–0.4)
Eos: 2 %
Hematocrit: 34.2 % — ABNORMAL LOW (ref 37.5–51.0)
Hemoglobin: 11 g/dL — ABNORMAL LOW (ref 13.0–17.7)
Immature Grans (Abs): 0 10*3/uL (ref 0.0–0.1)
Immature Granulocytes: 1 %
Lymphocytes Absolute: 1.2 10*3/uL (ref 0.7–3.1)
Lymphs: 32 %
MCH: 29 pg (ref 26.6–33.0)
MCHC: 32.2 g/dL (ref 31.5–35.7)
MCV: 90 fL (ref 79–97)
Monocytes Absolute: 0.4 10*3/uL (ref 0.1–0.9)
Monocytes: 10 %
Neutrophils Absolute: 2 10*3/uL (ref 1.4–7.0)
Neutrophils: 54 %
Platelets: 181 10*3/uL (ref 150–450)
RBC: 3.79 x10E6/uL — ABNORMAL LOW (ref 4.14–5.80)
RDW: 13.2 % (ref 11.6–15.4)
WBC: 3.7 10*3/uL (ref 3.4–10.8)

## 2020-12-18 LAB — PSA, TOTAL AND FREE
PSA, Free Pct: 30 %
PSA, Free: 0.66 ng/mL
Prostate Specific Ag, Serum: 2.2 ng/mL (ref 0.0–4.0)

## 2020-12-18 LAB — CMP14+EGFR
ALT: 16 IU/L (ref 0–44)
AST: 25 IU/L (ref 0–40)
Albumin/Globulin Ratio: 1.6 (ref 1.2–2.2)
Albumin: 4.1 g/dL (ref 3.6–4.6)
Alkaline Phosphatase: 94 IU/L (ref 44–121)
BUN/Creatinine Ratio: 21 (ref 10–24)
BUN: 23 mg/dL (ref 8–27)
Bilirubin Total: 0.3 mg/dL (ref 0.0–1.2)
CO2: 22 mmol/L (ref 20–29)
Calcium: 9.5 mg/dL (ref 8.6–10.2)
Chloride: 102 mmol/L (ref 96–106)
Creatinine, Ser: 1.08 mg/dL (ref 0.76–1.27)
Globulin, Total: 2.5 g/dL (ref 1.5–4.5)
Glucose: 297 mg/dL — ABNORMAL HIGH (ref 70–99)
Potassium: 4.9 mmol/L (ref 3.5–5.2)
Sodium: 138 mmol/L (ref 134–144)
Total Protein: 6.6 g/dL (ref 6.0–8.5)
eGFR: 66 mL/min/{1.73_m2} (ref >59)

## 2020-12-18 LAB — LIPID PANEL
Chol/HDL Ratio: 2.1 ratio (ref 0.0–5.0)
Cholesterol, Total: 144 mg/dL (ref 100–199)
HDL: 67 mg/dL (ref >39)
LDL Chol Calc (NIH): 58 mg/dL (ref 0–99)
Triglycerides: 107 mg/dL (ref 0–149)
VLDL Cholesterol Cal: 19 mg/dL (ref 5–40)

## 2020-12-19 ENCOUNTER — Telehealth: Payer: Self-pay

## 2020-12-19 NOTE — Chronic Care Management (AMB) (Signed)
  Chronic Care Management   Note  12/19/2020 Name: Roberto Miller MRN: [id number] DOB: 1932-03-18  Roberto Miller is a 87 y.o. year old male who is a primary care patient of Roberto Miller, Roberto Radon, Roberto Miller. Roberto Miller is currently enrolled in care management services. An additional referral for RN CM  was placed.   Follow up plan: Telephone appointment with care management team member scheduled for:12/26/2020  Roberto Miller, RMA Care Guide, Embedded Care Coordination [facility]  [place], [place] [address] Direct Dial: [phone] [email or web address]

## 2020-12-22 ENCOUNTER — Ambulatory Visit: Payer: Medicare Other | Admitting: Family Medicine

## 2020-12-26 ENCOUNTER — Ambulatory Visit (INDEPENDENT_AMBULATORY_CARE_PROVIDER_SITE_OTHER): Payer: Medicare Other | Admitting: *Deleted

## 2020-12-26 DIAGNOSIS — E1159 Type 2 diabetes mellitus with other circulatory complications: Secondary | ICD-10-CM

## 2020-12-26 DIAGNOSIS — E1169 Type 2 diabetes mellitus with other specified complication: Secondary | ICD-10-CM

## 2020-12-26 DIAGNOSIS — I152 Hypertension secondary to endocrine disorders: Secondary | ICD-10-CM

## 2020-12-26 NOTE — Patient Instructions (Signed)
Visit Information  PATIENT GOALS:  Goals Addressed             This Visit's Progress    Monitor and Manage My Blood Sugar-Diabetes Type 2   On track    Timeframe:  Long-Range Goal Priority:  Medium Start Date:    12/26/20                         Expected End Date:                       Follow Up Date: PRN   Check and record blood sugar daily Call PCP with any readings outside of recommended range Take blood sugar log to all appointments Call RN Care Manager as needed Follow an ADA Low Carb Diet Take all medication as prescribed Keep all medical appointments   Why is this important?   Checking your blood sugar at home helps to keep it from getting very high or very low.  Writing the results in a diary or log helps the doctor know how to care for you.  Your blood sugar log should have the time, date and the results.  Also, write down the amount of insulin or other medicine that you take.  Other information, like what you ate, exercise done and how you were feeling, will also be helpful.     Notes:         Patient verbalizes understanding of instructions provided today and agrees to view in MyChart.   The patient has been provided with contact information for the care management team and has been advised to call with any health related questions or concerns.   Demetrios Loll, BSN, RN-BC Embedded Chronic Care Manager [facility] Care Management Direct Dial: [phone]

## 2020-12-26 NOTE — Chronic Care Management (AMB) (Signed)
Chronic Care Management   CCM RN Visit Note  12/26/2020 Name: Roberto Miller MRN: [id number] DOB: Oct 26, 1931  Subjective: Roberto Miller is a 87 y.o. year old male who is a primary care patient of [name], Fransisca Kaufmann, MD. The care management team was consulted for assistance with disease management and care coordination needs.    Engaged with patient's daughter, Roberto Miller, by telephone  for follow up visit in response to provider referral for case management and/or care coordination services.   Consent to Services:  The patient was given information about Chronic Care Management services, agreed to services, and gave verbal consent prior to initiation of services.  Please see initial visit note for detailed documentation.   Patient agreed to services and verbal consent obtained.   Assessment: Review of patient past medical history, allergies, medications, health status, including review of consultants reports, laboratory and other test data, was performed as part of comprehensive evaluation and provision of chronic care management services.   SDOH (Social Determinants of Health) assessments and interventions performed:    CCM Care Plan  No Known Allergies  Outpatient Encounter Medications as of 12/26/2020  Medication Sig   Accu-Chek Softclix Lancets lancets Check blood sugar 1x per day and prn   Dx E11.9   Blood Glucose Monitoring Suppl (ACCU-CHEK GUIDE) w/Device KIT Check blood sugar 1x per day and prn   Dx E11.9   diclofenac (VOLTAREN) 75 MG EC tablet Take 1 tablet (75 mg total) by mouth 2 (two) times daily.   diclofenac Sodium (VOLTAREN) 1 % GEL Apply 2 g topically 4 (four) times daily.   empagliflozin (JARDIANCE) 25 MG TABS tablet Take 1 tablet (25 mg total) by mouth daily before breakfast.   ferrous gluconate (FERGON) 324 MG tablet Take 1 tablet (324 mg total) by mouth daily.   glipiZIDE (GLUCOTROL) 10 MG tablet Take 1 tablet (10 mg total) by mouth daily before breakfast.    glucose blood (ACCU-CHEK GUIDE) test strip Check blood sugar 1x per day and prn   Dx E11.9   hydrocortisone 1 % ointment Apply 1 application topically 2 (two) times daily.   latanoprost (XALATAN) 0.005 % ophthalmic solution Place 1 drop into both eyes at bedtime.   lisinopril (ZESTRIL) 5 MG tablet Take 1 tablet (5 mg total) by mouth daily.   Multiple Vitamin (MULTIVITAMIN) capsule Take 1 capsule by mouth daily.   rosuvastatin (CRESTOR) 5 MG tablet Take 1 tablet (5 mg total) by mouth daily.   sitaGLIPtin-metformin (JANUMET) 50-1000 MG tablet Take 1 tablet by mouth 2 (two) times daily with a meal.   No facility-administered encounter medications on file as of 12/26/2020.    Patient Active Problem List   Diagnosis Date Noted   Other intervertebral disc degeneration, lumbar region 06/12/2020   Iron deficiency anemia 07/31/2015   Absolute anemia 03/10/2015   Abdominal hernia without obstruction and without gangrene 02/24/2015   BMI 30.0-30.9,adult 10/09/2014   Hypertension associated with diabetes ([place]) 12/15/2012   Diabetes ([place]) 12/15/2012    Conditions to be addressed/monitored:HTN and DMII  Care Plan : [place]  Updates made by Ilean China, RN since 12/26/2020 12:00 AM     Problem: Chronic Disease Management Needs   Priority: Medium     Goal: Patient/Daughter will reach out to RN Care Manager as Needed for Assistance with Care Management and Care Coordination Associated with Chronic Medical Conditions   Start Date: 12/26/2020  This Visit's Progress: Not on track  Priority: Medium  Note:  Current Barriers:  Chronic Disease Management support and education needs related to HTN and DMII Language Barrier  RNCM Clinical Goal(s):  Patient demonstrate improvement in self-health management as demonstrated by a decrease in A1C to normal range Patient/family will reach out to [facility] for Care Manager [phone]) and Care Coordination Support  Interventions: 1:1  collaboration with primary care provider regarding development and update of comprehensive plan of care as evidenced by provider attestation and co-signature Inter-disciplinary care team collaboration (see longitudinal plan of care) Evaluation of current treatment plan related to  self management and patient's adherence to plan as established by provider Provided with RNCM contact information and encouraged to reach out as needed   Diabetes:  (Status:  New Goal & Patient Declined Further Engagement at This Time ) Lab Results  Component Value Date   HGBA1C 9.9 (H) 12/17/2020   HGBA1C 7.9 (H) 09/15/2020   HGBA1C 7.5 (H) 05/22/2020   Lab Results  Component Value Date   MICROALBUR 50 04/01/2014   [place] 58 12/17/2020   CREATININE 1.08 12/17/2020  Assessed patient's understanding of A1c goal: <7% Provided education to patient about basic DM disease process; Reviewed medications with patient and discussed importance of medication adherence;        Reviewed prescribed diet with patient ADA/Carb Modified diet and low glycemic foods; Counseled on importance of regular laboratory monitoring as prescribed;        Advised patient, providing education and rationale, to check cbg daily and PRN and record        call provider for findings outside established parameters;       Reviewed and discussed provider's referral Discussed recent increase in A1C and self management of diabetes Discussed family/social support. Lives with daughter, Roberto Miller, and she assists with all care needs. Per Roberto Miller, patient is doing very well and does not need any extra support at this time. She will reach out if CCM services are needed.    Patient Goals/Self-Care Activities: Patient will self administer medications as prescribed as evidenced by self report/primary caregiver report  Patient will attend all scheduled provider appointments as evidenced by clinician review of documented attendance to scheduled appointments  and patient/caregiver report       Plan:The patient has been provided with contact information for the care management team and has been advised to call with any health related questions or concerns.   Chong Sicilian, BSN, RN-BC Embedded Chronic Care Manager [facility] Direct Dial: [phone]

## 2021-01-13 ENCOUNTER — Other Ambulatory Visit: Payer: Self-pay | Admitting: Family Medicine

## 2021-01-19 DIAGNOSIS — E1169 Type 2 diabetes mellitus with other specified complication: Secondary | ICD-10-CM | POA: Diagnosis not present

## 2021-01-19 DIAGNOSIS — E1159 Type 2 diabetes mellitus with other circulatory complications: Secondary | ICD-10-CM | POA: Diagnosis not present

## 2021-01-19 DIAGNOSIS — I152 Hypertension secondary to endocrine disorders: Secondary | ICD-10-CM

## 2021-02-06 ENCOUNTER — Other Ambulatory Visit: Payer: Self-pay | Admitting: Family Medicine

## 2021-02-19 DIAGNOSIS — Z961 Presence of intraocular lens: Secondary | ICD-10-CM | POA: Diagnosis not present

## 2021-02-19 DIAGNOSIS — E119 Type 2 diabetes mellitus without complications: Secondary | ICD-10-CM | POA: Diagnosis not present

## 2021-02-19 DIAGNOSIS — H40013 Open angle with borderline findings, low risk, bilateral: Secondary | ICD-10-CM | POA: Diagnosis not present

## 2021-02-19 LAB — HM DIABETES EYE EXAM

## 2021-02-25 ENCOUNTER — Other Ambulatory Visit: Payer: Self-pay | Admitting: Family Medicine

## 2021-02-25 DIAGNOSIS — E119 Type 2 diabetes mellitus without complications: Secondary | ICD-10-CM

## 2021-02-26 ENCOUNTER — Other Ambulatory Visit: Payer: Self-pay | Admitting: Family Medicine

## 2021-03-25 ENCOUNTER — Encounter: Payer: Self-pay | Admitting: Family Medicine

## 2021-03-25 ENCOUNTER — Ambulatory Visit (INDEPENDENT_AMBULATORY_CARE_PROVIDER_SITE_OTHER): Payer: Medicare Other | Admitting: Family Medicine

## 2021-03-25 VITALS — BP 116/58 | HR 57 | Ht 63.0 in | Wt 143.0 lb

## 2021-03-25 DIAGNOSIS — D508 Other iron deficiency anemias: Secondary | ICD-10-CM

## 2021-03-25 DIAGNOSIS — I152 Hypertension secondary to endocrine disorders: Secondary | ICD-10-CM

## 2021-03-25 DIAGNOSIS — E1169 Type 2 diabetes mellitus with other specified complication: Secondary | ICD-10-CM

## 2021-03-25 DIAGNOSIS — E119 Type 2 diabetes mellitus without complications: Secondary | ICD-10-CM | POA: Diagnosis not present

## 2021-03-25 DIAGNOSIS — E1159 Type 2 diabetes mellitus with other circulatory complications: Secondary | ICD-10-CM | POA: Diagnosis not present

## 2021-03-25 LAB — BAYER DCA HB A1C WAIVED: HB A1C (BAYER DCA - WAIVED): 6.8 % — ABNORMAL HIGH (ref 4.8–5.6)

## 2021-03-25 MED ORDER — JANUMET 50-1000 MG PO TABS: 1.0000 | ORAL_TABLET | Freq: Two times a day (BID) | ORAL | 3 refills | Status: DC

## 2021-03-25 MED ORDER — LISINOPRIL 5 MG PO TABS: 5.0000 mg | ORAL_TABLET | Freq: Every day | ORAL | 3 refills | Status: DC

## 2021-03-25 NOTE — Progress Notes (Signed)
BP (!) 116/58    Pulse (!) 57    Ht _0  (1.6 m)    Wt 143 lb (64.9 kg)    SpO2 97%    BMI 25.33 kg/m    Subjective:   Patient ID: Roberto Miller, male    DOB: April 22, 1931, 88 y.o.   MRN: [id number]  HPI: Roberto Miller is a 88 y.o. male presenting on 03/25/2021 for Medical Management of Chronic Issues and Diabetes   HPI Type 2 diabetes mellitus Patient comes in today for recheck of his diabetes. Patient has been currently taking Januvia and glipizide, he says he never took the [place] and does not have it.  A1c is much improved at 6.8.Marland Kitchen Patient is currently on an ACE inhibitor/ARB. Patient has seen an ophthalmologist this year. Patient denies any issues with their feet. The symptom started onset as an adult hypertension ARE RELATED TO DM   Hypertension Patient is currently on lisinopril, and their blood pressure today is 116/58. Patient denies any lightheadedness or dizziness. Patient denies headaches, blurred vision, chest pains, shortness of breath, or weakness. Denies any side effects from medication and is content with current medication.   Anemia recheck Patient is coming in today for anemia recheck.  He denies any symptoms and says is feeling good.  He tries his diet and eating better.  Relevant past medical, surgical, family and social history reviewed and updated as indicated. Interim medical history since our last visit reviewed. Allergies and medications reviewed and updated.  Review of Systems  Constitutional:  Negative for chills and fever.  Eyes:  Negative for visual disturbance.  Respiratory:  Negative for shortness of breath and wheezing.   Cardiovascular:  Negative for chest pain and leg swelling.  Musculoskeletal:  Negative for back pain and gait problem.  Skin:  Negative for rash.  Neurological:  Negative for dizziness, weakness and light-headedness.  All other systems reviewed and are negative.  Per HPI unless specifically indicated above   Allergies as  of 03/25/2021   No Known Allergies      Medication List        Accurate as of March 25, 2021  2:44 PM. If you have any questions, ask your nurse or doctor.          STOP taking these medications    diclofenac 75 MG EC tablet Commonly known as: VOLTAREN Stopped by: Fransisca Kaufmann Finneas Mathe, MD   empagliflozin 25 MG Tabs tablet Commonly known as: Jardiance Stopped by: Worthy Rancher, MD       TAKE these medications    Accu-Chek Guide test strip Generic drug: glucose blood Check blood sugar 1x per day and prn   Dx E11.9   Accu-Chek Guide w/Device Kit Check blood sugar 1x per day and prn   Dx E11.9   Accu-Chek Softclix Lancets lancets Test BS 3 TIMES DAILY Dx E11.9   diclofenac Sodium 1 % Gel Commonly known as: Voltaren Apply 2 g topically 4 (four) times daily.   ferrous gluconate 324 MG tablet Commonly known as: FERGON Take 1 tablet (324 mg total) by mouth daily.   glipiZIDE 10 MG tablet Commonly known as: GLUCOTROL Take 1 tablet (10 mg total) by mouth daily before breakfast.   hydrocortisone 1 % ointment Apply 1 application topically 2 (two) times daily.   Janumet 50-1000 MG tablet Generic drug: sitaGLIPtin-metformin Take 1 tablet by mouth 2 (two) times daily with a meal. What changed: See the new instructions. Changed by: Fransisca Kaufmann Leita Lindbloom,  MD   latanoprost 0.005 % ophthalmic solution Commonly known as: XALATAN Place 1 drop into both eyes at bedtime.   lisinopril 5 MG tablet Commonly known as: ZESTRIL Take 1 tablet (5 mg total) by mouth daily. What changed: See the new instructions. Changed by: Fransisca Kaufmann Aylah Yeary, MD   multivitamin capsule Take 1 capsule by mouth daily.   rosuvastatin 5 MG tablet Commonly known as: Crestor Take 1 tablet (5 mg total) by mouth daily.         Objective:   BP (!) 116/58    Pulse (!) 57    Ht _0  (1.6 m)    Wt 143 lb (64.9 kg)    SpO2 97%    BMI 25.33 kg/m   Wt Readings from Last 3 Encounters:   03/25/21 143 lb (64.9 kg)  12/17/20 138 lb (62.6 kg)  11/03/20 138 lb (62.6 kg)    Physical Exam Vitals and nursing note reviewed.  Constitutional:      General: He is not in acute distress.    Appearance: He is well-developed. He is not diaphoretic.  Eyes:     General: No scleral icterus.    Conjunctiva/sclera: Conjunctivae normal.  Neck:     Thyroid: No thyromegaly.  Cardiovascular:     Rate and Rhythm: Normal rate and regular rhythm.     Heart sounds: Normal heart sounds. No murmur heard. Pulmonary:     Effort: Pulmonary effort is normal. No respiratory distress.     Breath sounds: Normal breath sounds. No wheezing.  Musculoskeletal:        General: No swelling. Normal range of motion.     Cervical back: Neck supple.  Lymphadenopathy:     Cervical: No cervical adenopathy.  Skin:    General: Skin is warm and dry.     Findings: No rash.  Neurological:     Mental Status: He is alert and oriented to person, place, and time.     Coordination: Coordination normal.  Psychiatric:        Behavior: Behavior normal.      Assessment & Plan:   Problem List Items Addressed This Visit       Cardiovascular and Mediastinum   Hypertension associated with diabetes ([place])   Relevant Medications   sitaGLIPtin-metformin (JANUMET) 50-1000 MG tablet   lisinopril (ZESTRIL) 5 MG tablet     Endocrine   Diabetes ([facility]) - Primary   Relevant Medications   sitaGLIPtin-metformin (JANUMET) 50-1000 MG tablet   lisinopril (ZESTRIL) 5 MG tablet   Other Relevant Orders   Bayer DCA Hb A1c Waived     Other   Iron deficiency anemia    Continue current medicine, A1c looks good at 6.8. Follow up plan: Return in about 3 months (around 06/23/2021), or if symptoms worsen or fail to improve, for Diabetes.  Counseling provided for all of the vaccine components Orders Placed This Encounter  Procedures   Bayer [place] Hb A1c [place] Carolynne Schuchard, MD [facility] 03/25/2021, 2:44 PM

## 2021-04-18 ENCOUNTER — Other Ambulatory Visit: Payer: Self-pay | Admitting: Family Medicine

## 2021-06-11 DIAGNOSIS — H40013 Open angle with borderline findings, low risk, bilateral: Secondary | ICD-10-CM | POA: Diagnosis not present

## 2021-06-11 DIAGNOSIS — H1013 Acute atopic conjunctivitis, bilateral: Secondary | ICD-10-CM | POA: Diagnosis not present

## 2021-06-24 ENCOUNTER — Ambulatory Visit: Payer: Medicare Other | Admitting: Family Medicine

## 2021-06-25 ENCOUNTER — Encounter: Payer: Self-pay | Admitting: Family Medicine

## 2021-07-25 ENCOUNTER — Other Ambulatory Visit: Payer: Self-pay | Admitting: Family Medicine

## 2021-08-13 ENCOUNTER — Ambulatory Visit (INDEPENDENT_AMBULATORY_CARE_PROVIDER_SITE_OTHER): Payer: Medicare Other | Admitting: Family Medicine

## 2021-08-13 ENCOUNTER — Encounter: Payer: Self-pay | Admitting: Family Medicine

## 2021-08-13 VITALS — BP 120/59 | HR 65 | Temp 98.0°F | Ht 63.0 in | Wt 143.0 lb

## 2021-08-13 DIAGNOSIS — N401 Enlarged prostate with lower urinary tract symptoms: Secondary | ICD-10-CM | POA: Diagnosis not present

## 2021-08-13 DIAGNOSIS — E1159 Type 2 diabetes mellitus with other circulatory complications: Secondary | ICD-10-CM | POA: Diagnosis not present

## 2021-08-13 DIAGNOSIS — I152 Hypertension secondary to endocrine disorders: Secondary | ICD-10-CM

## 2021-08-13 DIAGNOSIS — R350 Frequency of micturition: Secondary | ICD-10-CM

## 2021-08-13 DIAGNOSIS — E1169 Type 2 diabetes mellitus with other specified complication: Secondary | ICD-10-CM

## 2021-08-13 DIAGNOSIS — D508 Other iron deficiency anemias: Secondary | ICD-10-CM | POA: Diagnosis not present

## 2021-08-13 LAB — BAYER DCA HB A1C WAIVED: HB A1C (BAYER DCA - WAIVED): 7.5 % — ABNORMAL HIGH (ref 4.8–5.6)

## 2021-08-13 MED ORDER — ROSUVASTATIN CALCIUM 5 MG PO TABS: 5.0000 mg | ORAL_TABLET | Freq: Every day | ORAL | 3 refills | Status: DC

## 2021-08-13 MED ORDER — SEMAGLUTIDE (1 MG/DOSE) 4 MG/3ML ~~LOC~~ SOPN: 1.0000 mg | PEN_INJECTOR | SUBCUTANEOUS | 3 refills | Status: DC

## 2021-08-13 MED ORDER — GLIPIZIDE 10 MG PO TABS: 10.0000 mg | ORAL_TABLET | Freq: Every day | ORAL | 3 refills | Status: DC

## 2021-08-13 MED ORDER — TAMSULOSIN HCL 0.4 MG PO CAPS: 0.4000 mg | ORAL_CAPSULE | Freq: Every day | ORAL | 3 refills | Status: DC

## 2021-08-13 MED ORDER — OZEMPIC (0.25 OR 0.5 MG/DOSE) 2 MG/3ML ~~LOC~~ SOPN: 0.5000 mg | PEN_INJECTOR | SUBCUTANEOUS | 3 refills | Status: DC

## 2021-08-13 MED ORDER — METFORMIN HCL 1000 MG PO TABS: 1000.0000 mg | ORAL_TABLET | Freq: Two times a day (BID) | ORAL | 3 refills | Status: DC

## 2021-08-13 NOTE — Progress Notes (Signed)
BP (!) 120/59   Pulse 65   Temp 98 F (36.7 C)   Ht 5' 3" (1.6 m)   Wt 143 lb (64.9 kg)   SpO2 94%   BMI 25.33 kg/m    Subjective:   Patient ID: Roberto Miller, male    DOB: 1932-01-01, 88 y.o.   MRN: [id number]  HPI: Roberto Miller is a 88 y.o. male presenting on 08/13/2021 for Medical Management of Chronic Issues and Diabetes   HPI Type 2 diabetes mellitus Patient comes in today for recheck of his diabetes. Patient has been currently taking Ozempic and metformin and glipizide. Patient is currently on an ACE inhibitor/ARB. Patient has not seen an ophthalmologist this year. Patient denies any issues with their feet. The symptom started onset as an adult hypertension ARE RELATED TO DM   Hypertension Patient is currently on lisinopril, and their blood pressure today is 120/59. Patient denies any lightheadedness or dizziness. Patient denies headaches, blurred vision, chest pains, shortness of breath, or weakness. Denies any side effects from medication and is content with current medication.   Anemia Coming in for anemia recheck.  He is still been doing iron and wants to know if he needs to continue forward with it.  We will have him hold it until we have the lab results back.  He denies any lightheadedness or dizziness.  Relevant past medical, surgical, family and social history reviewed and updated as indicated. Interim medical history since our last visit reviewed. Allergies and medications reviewed and updated.  Review of Systems  Constitutional:  Negative for chills and fever.  Eyes:  Negative for visual disturbance.  Respiratory:  Negative for shortness of breath and wheezing.   Cardiovascular:  Negative for chest pain and leg swelling.  Musculoskeletal:  Negative for back pain and gait problem.  Skin:  Negative for rash.  All other systems reviewed and are negative.  Per HPI unless specifically indicated above   Allergies as of 08/13/2021   No Known Allergies       Medication List        Accurate as of Aug 13, 2021 11:59 PM. If you have any questions, ask your nurse or doctor.          STOP taking these medications    Janumet 50-1000 MG tablet Generic drug: sitaGLIPtin-metformin Stopped by: Fransisca Kaufmann Chanie Soucek, MD       TAKE these medications    Accu-Chek Guide test strip Generic drug: glucose blood TEST BS 3 TIMES DAILY DX E11.9   Accu-Chek Guide w/Device Kit Check blood sugar 1x per day and prn   Dx E11.9   Accu-Chek Softclix Lancets lancets Test BS 3 TIMES DAILY Dx E11.9   diclofenac Sodium 1 % Gel Commonly known as: Voltaren Apply 2 g topically 4 (four) times daily.   ferrous gluconate 324 MG tablet Commonly known as: FERGON TAKE ONE (1) TABLET BY MOUTH EVERY DAY   glipiZIDE 10 MG tablet Commonly known as: GLUCOTROL Take 1 tablet (10 mg total) by mouth daily before breakfast. What changed: See the new instructions. Changed by: Fransisca Kaufmann Inice Sanluis, MD   hydrocortisone 1 % ointment Apply 1 application topically 2 (two) times daily.   latanoprost 0.005 % ophthalmic solution Commonly known as: XALATAN Place 1 drop into both eyes at bedtime.   lisinopril 5 MG tablet Commonly known as: ZESTRIL Take 1 tablet (5 mg total) by mouth daily.   metFORMIN 1000 MG tablet Commonly known as: GLUCOPHAGE Take 1 tablet (1,000  mg total) by mouth 2 (two) times daily with a meal. Started by: Fransisca Kaufmann Cledis Sohn, MD   multivitamin capsule Take 1 capsule by mouth daily.   Ozempic (0.25 or 0.5 MG/DOSE) 2 MG/3ML Sopn Generic drug: Semaglutide(0.25 or 0.5MG/DOS) Inject 0.5 mg into the skin once a week. Started by: Fransisca Kaufmann Iya Hamed, MD   rosuvastatin 5 MG tablet Commonly known as: CRESTOR Take 1 tablet (5 mg total) by mouth daily. What changed: how much to take Changed by: Fransisca Kaufmann Rewa Weissberg, MD   tamsulosin 0.4 MG Caps capsule Commonly known as: FLOMAX Take 1 capsule (0.4 mg total) by mouth daily. Started by: Fransisca Kaufmann  Charnette Younkin, MD         Objective:   BP (!) 120/59   Pulse 65   Temp 98 F (36.7 C)   Ht 5' 3" (1.6 m)   Wt 143 lb (64.9 kg)   SpO2 94%   BMI 25.33 kg/m   Wt Readings from Last 3 Encounters:  08/13/21 143 lb (64.9 kg)  03/25/21 143 lb (64.9 kg)  12/17/20 138 lb (62.6 kg)    Physical Exam Vitals and nursing note reviewed.  Constitutional:      General: He is not in acute distress.    Appearance: He is well-developed. He is not diaphoretic.  Eyes:     General: No scleral icterus.    Conjunctiva/sclera: Conjunctivae normal.  Neck:     Thyroid: No thyromegaly.  Cardiovascular:     Rate and Rhythm: Normal rate and regular rhythm.     Heart sounds: Normal heart sounds. No murmur heard. Pulmonary:     Effort: Pulmonary effort is normal. No respiratory distress.     Breath sounds: Normal breath sounds. No wheezing.  Musculoskeletal:        General: No swelling. Normal range of motion.     Cervical back: Neck supple.  Lymphadenopathy:     Cervical: No cervical adenopathy.  Skin:    General: Skin is warm and dry.     Findings: No rash.  Neurological:     Mental Status: He is alert and oriented to person, place, and time.     Coordination: Coordination normal.  Psychiatric:        Behavior: Behavior normal.      Assessment & Plan:   Problem List Items Addressed This Visit       Cardiovascular and Mediastinum   Hypertension associated with diabetes ([place])   Relevant Medications   glipiZIDE (GLUCOTROL) 10 MG tablet   rosuvastatin (CRESTOR) 5 MG tablet   metFORMIN (GLUCOPHAGE) 1000 MG tablet   Semaglutide,0.25 or 0.5MG/DOS, (OZEMPIC, 0.25 OR 0.5 MG/DOSE,) 2 MG/3ML SOPN   Other Relevant Orders   CBC with Differential/Platelet (Completed)   CMP14+EGFR (Completed)   Lipid panel (Completed)   Bayer DCA Hb A1c Waived (Completed)     Endocrine   Diabetes ([facility]) - Primary   Relevant Medications   glipiZIDE (GLUCOTROL) 10 MG tablet   rosuvastatin (CRESTOR) 5 MG  tablet   metFORMIN (GLUCOPHAGE) 1000 MG tablet   Semaglutide,0.25 or 0.5MG/DOS, (OZEMPIC, 0.25 OR 0.5 MG/DOSE,) 2 MG/3ML SOPN   Other Relevant Orders   CBC with Differential/Platelet (Completed)   CMP14+EGFR (Completed)   Lipid panel (Completed)   Bayer DCA Hb A1c Waived (Completed)   AMB Referral to [facility]     Other   Iron deficiency anemia   Other Visit Diagnoses     Benign prostatic hyperplasia with urinary frequency  Relevant Medications   tamsulosin (FLOMAX) 0.4 MG CAPS capsule       Blood counts pending, continue current medicine.  No changes today. Follow up plan: Return in about 3 months (around 11/13/2021), or if symptoms worsen or fail to improve, for Diabetes and hypertension.  Counseling provided for all of the vaccine components Orders Placed This Encounter  Procedures   CBC with Differential/Platelet   CMP14+EGFR   Lipid panel   Bayer DCA Hb A1c Waived   AMB Referral to [place] [name], MD [facility] 08/23/2021, 9:38 PM

## 2021-08-14 ENCOUNTER — Telehealth: Payer: Self-pay

## 2021-08-14 LAB — CMP14+EGFR
ALT: 13 IU/L (ref 0–44)
AST: 22 IU/L (ref 0–40)
Albumin/Globulin Ratio: 1.7 (ref 1.2–2.2)
Albumin: 4 g/dL (ref 3.6–4.6)
Alkaline Phosphatase: 77 IU/L (ref 44–121)
BUN/Creatinine Ratio: 16 (ref 10–24)
BUN: 18 mg/dL (ref 8–27)
Bilirubin Total: 0.4 mg/dL (ref 0.0–1.2)
CO2: 21 mmol/L (ref 20–29)
Calcium: 8.9 mg/dL (ref 8.6–10.2)
Chloride: 105 mmol/L (ref 96–106)
Creatinine, Ser: 1.13 mg/dL (ref 0.76–1.27)
Globulin, Total: 2.3 g/dL (ref 1.5–4.5)
Glucose: 304 mg/dL — ABNORMAL HIGH (ref 70–99)
Potassium: 4.9 mmol/L (ref 3.5–5.2)
Sodium: 138 mmol/L (ref 134–144)
Total Protein: 6.3 g/dL (ref 6.0–8.5)
eGFR: 63 mL/min/{1.73_m2} (ref >59)

## 2021-08-14 LAB — CBC WITH DIFFERENTIAL/PLATELET
Basophils Absolute: 0 10*3/uL (ref 0.0–0.2)
Basos: 1 %
EOS (ABSOLUTE): 0.1 10*3/uL (ref 0.0–0.4)
Eos: 2 %
Hematocrit: 31.8 % — ABNORMAL LOW (ref 37.5–51.0)
Hemoglobin: 10.5 g/dL — ABNORMAL LOW (ref 13.0–17.7)
Immature Grans (Abs): 0 10*3/uL (ref 0.0–0.1)
Immature Granulocytes: 0 %
Lymphocytes Absolute: 0.8 10*3/uL (ref 0.7–3.1)
Lymphs: 21 %
MCH: 29.4 pg (ref 26.6–33.0)
MCHC: 33 g/dL (ref 31.5–35.7)
MCV: 89 fL (ref 79–97)
Monocytes Absolute: 0.3 10*3/uL (ref 0.1–0.9)
Monocytes: 8 %
Neutrophils Absolute: 2.6 10*3/uL (ref 1.4–7.0)
Neutrophils: 68 %
Platelets: 171 10*3/uL (ref 150–450)
RBC: 3.57 x10E6/uL — ABNORMAL LOW (ref 4.14–5.80)
RDW: 13 % (ref 11.6–15.4)
WBC: 3.8 10*3/uL (ref 3.4–10.8)

## 2021-08-14 LAB — LIPID PANEL
Chol/HDL Ratio: 1.7 ratio (ref 0.0–5.0)
Cholesterol, Total: 125 mg/dL (ref 100–199)
HDL: 73 mg/dL (ref >39)
LDL Chol Calc (NIH): 37 mg/dL (ref 0–99)
Triglycerides: 74 mg/dL (ref 0–149)
VLDL Cholesterol Cal: 15 mg/dL (ref 5–40)

## 2021-08-14 NOTE — Chronic Care Management (AMB) (Signed)
  Chronic Care Management   Note  08/14/2021 Name: Roberto Miller MRN: [id number] DOB: 07/14/1931  [name] is a 88 y.o. year old male who is a primary care patient of [name], Fransisca Kaufmann, MD. I reached out to [name] by phone today in response to a referral sent by [name] PCP.  [name] was given information about Chronic Care Management services today including:  CCM service includes personalized support from designated clinical staff supervised by his physician, including individualized plan of care and coordination with other care providers 24/7 contact phone numbers for assistance for urgent and routine care needs. Service will only be billed when office clinical staff spend 20 minutes or more in a month to coordinate care. Only one practitioner may furnish and bill the service in a calendar month. The patient may stop CCM services at any time (effective at the end of the month) by phone call to the office staff. The patient is responsible for co-pay (up to 20% after annual deductible is met) if co-pay is required by the individual health plan.   Patient agreed to services and verbal consent obtained.   Follow up plan: Telephone appointment with care management team member scheduled for:09/16/2021  Noreene Larsson, [place], [place], [place] [address] Direct Dial: [phone] [email or web address]

## 2021-09-10 ENCOUNTER — Ambulatory Visit (INDEPENDENT_AMBULATORY_CARE_PROVIDER_SITE_OTHER): Payer: Medicare Other

## 2021-09-10 VITALS — Wt 143.0 lb

## 2021-09-10 DIAGNOSIS — Z0000 Encounter for general adult medical examination without abnormal findings: Secondary | ICD-10-CM | POA: Diagnosis not present

## 2021-09-10 NOTE — Patient Instructions (Signed)
[name] , Thank you for taking time to come for your Medicare Wellness Visit. I appreciate your ongoing commitment to your health goals. Please review the following plan we discussed and let me know if I can assist you in the future.   Screening recommendations/referrals: Colonoscopy: no longer required Recommended yearly ophthalmology/optometry visit for glaucoma screening and checkup Recommended yearly dental visit for hygiene and checkup  Vaccinations: Influenza vaccine: Done 12/17/2020 - Repeat annually  Pneumococcal vaccine: Done 11/19/2010 & 07/04/2014      Tdap vaccine: Done 12/17/2020 - Repeat in 10 years  Shingles vaccine: Done  3/1/222 & 09/15/2020     Covid-19: Done 01/23/2020 & 11/12/2020  Advanced directives: Advance directive discussed with you today. I have provided a copy for you to complete at home and have notarized. Once this is complete please bring a copy in to our office so we can scan it into your chart.   Conditions/risks identified:  Aim for 30 minutes of exercise or brisk walking, 6-8 glasses of water, and 5 servings of fruits and vegetables each day.   Next appointment: Follow up in one year for your annual wellness visit.   Preventive Care 86 Years and Older, Male  Preventive care refers to lifestyle choices and visits with your health care provider that can promote health and wellness. What does preventive care include? A yearly physical exam. This is also called an annual well check. Dental exams once or twice a year. Routine eye exams. Ask your health care provider how often you should have your eyes checked. Personal lifestyle choices, including: Daily care of your teeth and gums. Regular physical activity. Eating a healthy diet. Avoiding tobacco and drug use. Limiting alcohol use. Practicing safe sex. Taking low doses of aspirin every day. Taking vitamin and mineral supplements as recommended by your health care provider. What happens during an  annual well check? The services and screenings done by your health care provider during your annual well check will depend on your age, overall health, lifestyle risk factors, and family history of disease. Counseling  Your health care provider may ask you questions about your: Alcohol use. Tobacco use. Drug use. Emotional well-being. Home and relationship well-being. Sexual activity. Eating habits. History of falls. Memory and ability to understand (cognition). Work and work Astronomer. Screening  You may have the following tests or measurements: Height, weight, and BMI. Blood pressure. Lipid and cholesterol levels. These may be checked every 5 years, or more frequently if you are over 86 years old. Skin check. Lung cancer screening. You may have this screening every year starting at age 86 if you have a 30-pack-year history of smoking and currently smoke or have quit within the past 15 years. Fecal occult blood test (FOBT) of the stool. You may have this test every year starting at age 86. Flexible sigmoidoscopy or colonoscopy. You may have a sigmoidoscopy every 5 years or a colonoscopy every 10 years starting at age 86. Prostate cancer screening. Recommendations will vary depending on your family history and other risks. Hepatitis C blood test. Hepatitis B blood test. Sexually transmitted disease (STD) testing. Diabetes screening. This is done by checking your blood sugar (glucose) after you have not eaten for a while (fasting). You may have this done every 1-3 years. Abdominal aortic aneurysm (AAA) screening. You may need this if you are a current or former smoker. Osteoporosis. You may be screened starting at age 86 if you are at high risk. Talk with your health care  provider about your test results, treatment options, and if necessary, the need for more tests. Vaccines  Your health care provider may recommend certain vaccines, such as: Influenza vaccine. This is recommended  every year. Tetanus, diphtheria, and acellular pertussis (Tdap, Td) vaccine. You may need a Td booster every 10 years. Zoster vaccine. You may need this after age 50. Pneumococcal 13-valent conjugate (PCV13) vaccine. One dose is recommended after age 86. Pneumococcal polysaccharide (PPSV23) vaccine. One dose is recommended after age 86. Talk to your health care provider about which screenings and vaccines you need and how often you need them. This information is not intended to replace advice given to you by your health care provider. Make sure you discuss any questions you have with your health care provider. Document Released: 04/04/2015 Document Revised: 11/26/2015 Document Reviewed: 01/07/2015 Elsevier Interactive Patient Education  2017 [employer].  Fall Prevention in the Home Falls can cause injuries. They can happen to people of all ages. There are many things you can do to make your home safe and to help prevent falls. What can I do on the outside of my home? Regularly fix the edges of walkways and driveways and fix any cracks. Remove anything that might make you trip as you walk through a door, such as a raised step or threshold. Trim any bushes or trees on the path to your home. Use bright outdoor lighting. Clear any walking paths of anything that might make someone trip, such as rocks or tools. Regularly check to see if handrails are loose or broken. Make sure that both sides of any steps have handrails. Any raised decks and porches should have guardrails on the edges. Have any leaves, snow, or ice cleared regularly. Use sand or salt on walking paths during winter. Clean up any spills in your garage right away. This includes oil or grease spills. What can I do in the bathroom? Use night lights. Install grab bars by the toilet and in the tub and shower. Do not use towel bars as grab bars. Use non-skid mats or decals in the tub or shower. If you need to sit down in the shower,  use a plastic, non-slip stool. Keep the floor dry. Clean up any water that spills on the floor as soon as it happens. Remove soap buildup in the tub or shower regularly. Attach bath mats securely with double-sided non-slip rug tape. Do not have throw rugs and other things on the floor that can make you trip. What can I do in the bedroom? Use night lights. Make sure that you have a light by your bed that is easy to reach. Do not use any sheets or blankets that are too big for your bed. They should not hang down onto the floor. Have a firm chair that has side arms. You can use this for support while you get dressed. Do not have throw rugs and other things on the floor that can make you trip. What can I do in the kitchen? Clean up any spills right away. Avoid walking on wet floors. Keep items that you use a lot in easy-to-reach places. If you need to reach something above you, use a strong step stool that has a grab bar. Keep electrical cords out of the way. Do not use floor polish or wax that makes floors slippery. If you must use wax, use non-skid floor wax. Do not have throw rugs and other things on the floor that can make you trip. What can I  do with my stairs? Do not leave any items on the stairs. Make sure that there are handrails on both sides of the stairs and use them. Fix handrails that are broken or loose. Make sure that handrails are as long as the stairways. Check any carpeting to make sure that it is firmly attached to the stairs. Fix any carpet that is loose or worn. Avoid having throw rugs at the top or bottom of the stairs. If you do have throw rugs, attach them to the floor with carpet tape. Make sure that you have a light switch at the top of the stairs and the bottom of the stairs. If you do not have them, ask someone to add them for you. What else can I do to help prevent falls? Wear shoes that: Do not have high heels. Have rubber bottoms. Are comfortable and fit you  well. Are closed at the toe. Do not wear sandals. If you use a stepladder: Make sure that it is fully opened. Do not climb a closed stepladder. Make sure that both sides of the stepladder are locked into place. Ask someone to hold it for you, if possible. Clearly mark and make sure that you can see: Any grab bars or handrails. First and last steps. Where the edge of each step is. Use tools that help you move around (mobility aids) if they are needed. These include: Canes. Walkers. Scooters. Crutches. Turn on the lights when you go into a dark area. Replace any light bulbs as soon as they burn out. Set up your furniture so you have a clear path. Avoid moving your furniture around. If any of your floors are uneven, fix them. If there are any pets around you, be aware of where they are. Review your medicines with your doctor. Some medicines can make you feel dizzy. This can increase your chance of falling. Ask your doctor what other things that you can do to help prevent falls. This information is not intended to replace advice given to you by your health care provider. Make sure you discuss any questions you have with your health care provider. Document Released: 01/02/2009 Document Revised: 08/14/2015 Document Reviewed: 04/12/2014 Elsevier Interactive Patient Education  2017 [employer].

## 2021-09-10 NOTE — Progress Notes (Signed)
Subjective:   Roberto Miller is a 88 y.o. male who presents for Medicare Annual/Subsequent preventive examination.  Virtual Visit via Telephone Note  I connected with  [name] on 09/10/21 at 10:30 AM EDT by telephone and verified that I am speaking with the correct person using two identifiers.  Location: Patient: Home Provider: [facility] Persons participating in the virtual visit: patient, [address], Aaron Edelman - ID# [id number], and [place]   I discussed the limitations, risks, security and privacy concerns of performing an evaluation and management service by telephone and the availability of in person appointments. The patient expressed understanding and agreed to proceed.  Interactive audio and video telecommunications were attempted between this nurse and patient, however failed, due to patient having technical difficulties OR patient did not have access to video capability.  We continued and completed visit with audio only.  Some vital signs may be absent or patient reported.   Lauryl Seyer E Veronia Laprise, LPN   Review of Systems     Cardiac Risk Factors include: advanced age (>36mn, >>32women);diabetes mellitus;hypertension;male gender     Objective:    Today's Vitals   09/10/21 1036  Weight: 143 lb (64.9 kg)   Body mass index is 25.33 kg/m.     09/10/2021   10:50 AM 09/09/2020    2:37 PM 06/10/2020    8:44 PM 09/06/2019    2:11 PM 08/29/2018    2:43 PM 04/01/2014    3:33 PM  Advanced Directives  Does Patient Have a Medical Advance Directive? No No No No Yes No  Type of Advance Directive     Living will;Healthcare Power of Attorney   Does patient want to make changes to medical advance directive?     No - Patient declined   Copy of HGilliamin Chart?     No - copy requested   Would patient like information on creating a medical advance directive? Yes (MAU/Ambulatory/Procedural Areas - Information given) No - Patient declined  No - Patient  declined  Yes - Educational materials given    Current Medications (verified) Outpatient Encounter Medications as of 09/10/2021  Medication Sig   Accu-Chek Softclix Lancets lancets Test BS 3 TIMES DAILY Dx E11.9   Blood Glucose Monitoring Suppl (ACCU-CHEK GUIDE) w/Device KIT Check blood sugar 1x per day and prn   Dx E11.9   diclofenac Sodium (VOLTAREN) 1 % GEL Apply 2 g topically 4 (four) times daily.   ferrous gluconate (FERGON) 324 MG tablet TAKE ONE (1) TABLET BY MOUTH EVERY DAY   glipiZIDE (GLUCOTROL) 10 MG tablet Take 1 tablet (10 mg total) by mouth daily before breakfast.   glucose blood (ACCU-CHEK GUIDE) test strip TEST BS 3 TIMES DAILY DX E11.9   hydrocortisone 1 % ointment Apply 1 application topically 2 (two) times daily.   JANUMET 50-1000 MG tablet Take 1 tablet by mouth 2 (two) times daily.   latanoprost (XALATAN) 0.005 % ophthalmic solution Place 1 drop into both eyes at bedtime.   lisinopril (ZESTRIL) 5 MG tablet Take 1 tablet (5 mg total) by mouth daily.   metFORMIN (GLUCOPHAGE) 1000 MG tablet Take 1 tablet (1,000 mg total) by mouth 2 (two) times daily with a meal.   Multiple Vitamin (MULTIVITAMIN) capsule Take 1 capsule by mouth daily.   rosuvastatin (CRESTOR) 5 MG tablet Take 1 tablet (5 mg total) by mouth daily.   Semaglutide,0.25 or 0.5MG/DOS, (OZEMPIC, 0.25 OR 0.5 MG/DOSE,) 2 MG/3ML SOPN Inject 0.5 mg into the skin  once a week.   tamsulosin (FLOMAX) 0.4 MG CAPS capsule Take 1 capsule (0.4 mg total) by mouth daily.   No facility-administered encounter medications on file as of 09/10/2021.    Allergies (verified) Patient has no known allergies.   History: Past Medical History:  Diagnosis Date   Diabetes mellitus without complication ([place])    Hyperlipidemia    Hypertension    Past Surgical History:  Procedure Laterality Date   EYE SURGERY Right    Family History  Problem Relation Age of Onset   Diabetes Sister    Diabetes Brother    Diabetes Sister     Social History   Socioeconomic History   Marital status: Married    Spouse name: Alyson Locket   Number of children: 9   Years of education: 1   Highest education level: 1st grade  Occupational History   Occupation: retired  Tobacco Use   Smoking status: Never   Smokeless tobacco: Never  Vaping Use   Vaping Use: Never used  Substance and Sexual Activity   Alcohol use: No   Drug use: No   Sexual activity: Not Currently  Other Topics Concern   Not on file  Social History Narrative   Living with daughter in [place] - speaks no English   Social Determinants of Health   Financial Resource Strain: [place]  (09/10/2021)   Overall Financial Resource Strain (CARDIA)    Difficulty of Paying Living Expenses: Not hard at all  Food Insecurity: No Food Insecurity (09/10/2021)   Hunger Vital Sign    Worried About Running Out of Food in the Last Year: Never true    [place] in the Last Year: Never true  Transportation Needs: No Transportation Needs (09/10/2021)   PRAPARE - Hydrologist (Medical): No    Lack of Transportation (Non-Medical): No  Physical Activity: Sufficiently Active (09/10/2021)   Exercise Vital Sign    Days of Exercise per Week: 7 days    Minutes of Exercise per Session: 30 min  Stress: No Stress Concern Present (09/10/2021)   [place]    Feeling of Stress : Not at all  Social Connections: [place] (09/10/2021)   Social Connection and Isolation Panel [NHANES]    Frequency of Communication with Friends and Family: More than three times a week    Frequency of Social Gatherings with Friends and Family: More than three times a week    Attends Religious Services: More than 4 times per year    Active Member of [employer] or Organizations: Yes    Attends Music therapist: More than 4 times per year    Marital Status: Married    Tobacco Counseling Counseling  given: Not Answered   Clinical Intake:  Pre-visit preparation completed: Yes  Pain : No/denies pain     BMI - recorded: 25.33 Nutritional Status: BMI 25 -29 Overweight Nutritional Risks: None Diabetes: Yes CBG done?: No Did pt. bring in CBG monitor from home?: No  How often do you need to have someone help you when you read instructions, pamphlets, or other written materials from your doctor or pharmacy?: 1 - Never  Diabetic? Nutrition Risk Assessment:  Has the patient had any N/V/D within the last 2 months?  No  Does the patient have any non-healing wounds?  No  Has the patient had any unintentional weight loss or weight gain?  No   Diabetes:  Is  the patient diabetic?  Yes  If diabetic, was a CBG obtained today?  No  Did the patient bring in their glucometer from home?  No  How often do you monitor your CBG's? Once daily fasting - 127 this am per patient.   Financial Strains and Diabetes Management:  Are you having any financial strains with the device, your supplies or your medication? No .  Does the patient want to be seen by Chronic Care Management for management of their diabetes?  No  Would the patient like to be referred to a Nutritionist or for Diabetic Management?  No   Diabetic Exams:  Diabetic Eye Exam: Completed 02/19/2021.   Diabetic Foot Exam: Completed 08/13/2021. Pt has been advised about the importance in completing this exam. Interpreter Needed?: No  Information entered by :: Ivann Trimarco, LPN   Activities of Daily Living    09/10/2021   10:51 AM  In your present state of health, do you have any difficulty performing the following activities:  Hearing? 0  Vision? 0  Difficulty concentrating or making decisions? 0  Walking or climbing stairs? 0  Dressing or bathing? 0  Doing errands, shopping? 0  Preparing Food and eating ? N  Using the Toilet? N  In the past six months, have you accidently leaked urine? N  Do you have problems with loss of  bowel control? N  Managing your Medications? N  Managing your Finances? N  Housekeeping or managing your Housekeeping? N    Patient Care Team: [name], Fransisca Kaufmann, MD as PCP - General (Family Medicine) Ilean China, RN as Case Manager Pruitt, Royce Macadamia, [facility] as [place] Management (Pharmacist) Katy Fitch, Darlina Guys, MD as Consulting Physician (Ophthalmology)  Indicate any recent Medical Services you may have received from other than [facility] providers in the past year (date may be approximate).     Assessment:   This is a routine wellness examination for [place].  Hearing/Vision screen Hearing Screening - Comments:: Denies hearing difficulties   Vision Screening - Comments:: Wears rx glasses - up to date with routine eye exams with [facility] - also seeing Dr Katy Fitch in [place]  Dietary issues and exercise activities discussed: Current Exercise Habits: Home exercise routine, Type of exercise: walking, Time (Minutes): 30, Frequency (Times/Week): 7, Weekly Exercise (Minutes/Week): 210, Intensity: Mild, Exercise limited by: orthopedic condition(s)   Goals Addressed             This Visit's Progress    Monitor and Manage My Blood Sugar-Diabetes Type 2   On track    Timeframe:  Long-Range Goal Priority:  Medium Start Date:    12/26/20                         Expected End Date:                       Follow Up Date: PRN   Check and record blood sugar daily Call PCP with any readings outside of recommended range Take blood sugar log to all appointments Call RN Care Manager as needed Follow an ADA Low Carb Diet Take all medication as prescribed Keep all medical appointments   Why is this important?   Checking your blood sugar at home helps to keep it from getting very high or very low.  Writing the results in a diary or log helps the doctor know how to care for you.  Your blood  sugar log should have the time, date and the results.  Also, write down  the amount of insulin or other medicine that you take.  Other information, like what you ate, exercise done and how you were feeling, will also be helpful.     Notes:      Prevent falls   On track      Depression Screen    09/10/2021   10:46 AM 08/13/2021    3:55 PM 03/25/2021    1:57 PM 12/17/2020    3:30 PM 11/03/2020    9:49 AM 09/15/2020    1:28 PM 09/09/2020    2:32 PM  PHQ 2/9 Scores  PHQ - 2 Score 0 0 0 0 0 0 0  PHQ- 9 Score 0 0         Fall Risk    09/10/2021   10:41 AM 08/13/2021    3:55 PM 03/25/2021    1:57 PM 12/17/2020    3:30 PM 11/03/2020    9:49 AM  [place] in the past year? 0 0 0 0 0  Number falls in past yr: 0      Injury with Fall? 0      Risk for fall due to : Orthopedic patient      Follow up Falls prevention discussed        [place]:  Any stairs in or around the home?  Yes, but also has a ramp If so, are there any without handrails? No  Home free of loose throw rugs in walkways, pet beds, electrical cords, etc? Yes  Adequate lighting in your home to reduce risk of falls? Yes   ASSISTIVE DEVICES UTILIZED TO PREVENT FALLS:  Life alert? No  Use of a cane, walker or w/c? No  Grab bars in the bathroom? Yes  Shower chair or bench in shower? Yes  Elevated toilet seat or a handicapped toilet? Yes   TIMED UP AND GO:  Was the test performed? No . Telephonic visit  Cognitive Function:    05/11/2017   12:00 PM  MMSE - Mini Mental State Exam  Not completed: Unable to complete        09/10/2021   10:53 AM 09/06/2019    2:15 PM 08/29/2018    2:44 PM  6CIT Screen  What Year? 0 points 4 points 0 points  What month? 0 points 0 points 0 points  What time? 0 points 0 points 0 points  Count back from 20 2 points 0 points 0 points  Months in reverse 4 points 4 points 0 points  Repeat phrase 4 points 10 points 0 points  Total Score 10 points 18 points 0 points    Immunizations Immunization History   Administered Date(s) Administered   Fluad Quad(high Dose 65+) 01/10/2019, 02/20/2020, 12/17/2020   Influenza, High Dose Seasonal PF 03/06/2018   Influenza,inj,Quad PF,6+ Mos 12/15/2012, 12/27/2013, 01/22/2016   Moderna Sars-Cov-2 Peds vaccine 18yr thru 190yr08/24/2022   Moderna Sars-Covid-2 Vaccination 01/23/2020   Pneumococcal Conjugate-13 07/04/2014   Pneumococcal Polysaccharide-23 11/19/2010   Tdap 09/19/2010, 12/17/2020   Zoster Recombinat (Shingrix) 06/02/2020, 09/15/2020    TDAP status: Up to date  Flu Vaccine status: Up to date  Pneumococcal vaccine status: Up to date  Covid-19 vaccine status: Completed vaccines  Qualifies for Shingles Vaccine? Yes   Zostavax completed Yes   Shingrix Completed?: Yes  Screening Tests Health Maintenance  Topic Date Due   COVID-19 Vaccine (  2 - Moderna series) 01/07/2021   INFLUENZA VACCINE  10/20/2021   HEMOGLOBIN A1C  02/13/2022   OPHTHALMOLOGY EXAM  02/19/2022   FOOT EXAM  08/14/2022   TETANUS/TDAP  12/18/2030   Pneumonia Vaccine 42+ Years old  Completed   Zoster Vaccines- Shingrix  Completed   HPV VACCINES  Aged Out    Health Maintenance  Health Maintenance Due  Topic Date Due   COVID-19 Vaccine (2 - Moderna series) 01/07/2021    Colorectal cancer screening: No longer required.   Lung Cancer Screening: (Low Dose CT Chest recommended if Age 8-80 years, 30 pack-year currently smoking OR have quit w/in 15years.) does not qualify.   Additional Screening:  Hepatitis C Screening: does qualify; Completed 08/12/2016  Vision Screening: Recommended annual ophthalmology exams for early detection of glaucoma and other disorders of the eye. Is the patient up to date with their annual eye exam?  Yes  Who is the provider or what is the name of the office in which the patient attends annual eye exams? [facility] in [place] If pt is not established with a provider, would they like to be referred to a provider to  establish care? No .   Dental Screening: Recommended annual dental exams for proper oral hygiene  Community Resource Referral / Chronic Care Management: CRR required this visit?  No   CCM required this visit?  No      Plan:     I have personally reviewed and noted the following in the patient's chart:   Medical and social history Use of alcohol, tobacco or illicit drugs  Current medications and supplements including opioid prescriptions. Patient is not currently taking opioid prescriptions. Functional ability and status Nutritional status Physical activity Advanced directives List of other physicians Hospitalizations, surgeries, and ER visits in previous 12 months Vitals Screenings to include cognitive, depression, and falls Referrals and appointments  In addition, I have reviewed and discussed with patient certain preventive protocols, quality metrics, and best practice recommendations. A written personalized care plan for preventive services as well as general preventive health recommendations were provided to patient.     Sandrea Hammond, LPN   09/15/9483   Nurse Notes: 6CIT = 10 - He asks the same questions repeatedly and I'm not sure he's taking medications correctly - I did tell him to have those and his daughter available when Pharmacist calls him 6/28.

## 2021-09-16 ENCOUNTER — Telehealth: Payer: Self-pay | Admitting: Pharmacist

## 2021-09-16 ENCOUNTER — Telehealth: Payer: Medicare Other | Admitting: Pharmacist

## 2021-09-16 NOTE — Telephone Encounter (Signed)
  Care Management   Follow Up Note   09/16/2021 Name: Roberto Miller MRN: [id number] DOB: 02/27/1932   Referred by: [name], Elige Radon, MD Reason for referral : Appointment (Unsuccessful outreach-diabetes)  [employer] was called to connect PCP office PharmD to patient.   Patient  did not answer and voicemail box was full.  An unsuccessful telephone outreach was attempted today. The patient was referred to the case management team for assistance with care management and care coordination.   Follow Up Plan: The care management team will reach out to the patient again over the next 5-7 days.     Kieth Brightly, PharmD, BCPS Clinical Pharmacist, [facility]  II Phone [phone]

## 2021-09-21 ENCOUNTER — Telehealth: Payer: Self-pay

## 2021-09-21 NOTE — Chronic Care Management (AMB) (Signed)
  Chronic Care Management Note  09/21/2021 Name: Roberto Miller MRN: [id number] DOB: 1931/09/23  Roberto Miller is a 88 y.o. year old male who is a primary care patient of [name], Elige Radon, MD and is actively engaged with the care management team. I reached out to [name] by phone today to assist with re-scheduling a follow up visit with the Pharmacist  Follow up plan: Unsuccessful telephone outreach attempt made.The care management team will reach out to the patient again over the next 7 days.  If patient returns call to provider office, please advise to call Embedded Care Management Care Guide Penne Lash  at [phone]  Penne Lash, RMA Care Guide, Embedded Care Coordination [facility]  [place] at [place], [place] [address] Direct Dial: [phone] [email or web address]

## 2021-09-28 DIAGNOSIS — E119 Type 2 diabetes mellitus without complications: Secondary | ICD-10-CM | POA: Diagnosis not present

## 2021-09-28 DIAGNOSIS — H1013 Acute atopic conjunctivitis, bilateral: Secondary | ICD-10-CM | POA: Diagnosis not present

## 2021-09-28 DIAGNOSIS — Z961 Presence of intraocular lens: Secondary | ICD-10-CM | POA: Diagnosis not present

## 2021-09-28 DIAGNOSIS — H40013 Open angle with borderline findings, low risk, bilateral: Secondary | ICD-10-CM | POA: Diagnosis not present

## 2021-10-16 NOTE — Chronic Care Management (AMB) (Signed)
  Chronic Care Management Note  10/16/2021 Name: Render Marley MRN: [id number] DOB: Jan 19, 1932  Liam Cammarata is a 88 y.o. year old male who is a primary care patient of [name], Elige Radon, MD and is actively engaged with the care management team. I reached out to [name] by phone today to assist with re-scheduling an initial visit with the Pharmacist  Follow up plan: Patient declines further follow up and engagement by the care management team. Appropriate care team members and provider have been notified via electronic communication.   Penne Lash, RMA Care Guide [facility] [place], [place] [address] Direct Dial: [phone] [email or web address]

## 2021-10-31 ENCOUNTER — Other Ambulatory Visit: Payer: Self-pay | Admitting: Family Medicine

## 2021-11-13 ENCOUNTER — Encounter: Payer: Self-pay | Admitting: Family Medicine

## 2021-11-13 ENCOUNTER — Ambulatory Visit (INDEPENDENT_AMBULATORY_CARE_PROVIDER_SITE_OTHER): Payer: Medicare Other | Admitting: Family Medicine

## 2021-11-13 VITALS — BP 102/50 | HR 75 | Temp 98.5°F | Ht 63.0 in | Wt 130.0 lb

## 2021-11-13 DIAGNOSIS — D508 Other iron deficiency anemias: Secondary | ICD-10-CM

## 2021-11-13 DIAGNOSIS — E1159 Type 2 diabetes mellitus with other circulatory complications: Secondary | ICD-10-CM

## 2021-11-13 DIAGNOSIS — R6889 Other general symptoms and signs: Secondary | ICD-10-CM | POA: Diagnosis not present

## 2021-11-13 DIAGNOSIS — Z136 Encounter for screening for cardiovascular disorders: Secondary | ICD-10-CM | POA: Diagnosis not present

## 2021-11-13 DIAGNOSIS — I152 Hypertension secondary to endocrine disorders: Secondary | ICD-10-CM

## 2021-11-13 DIAGNOSIS — E1169 Type 2 diabetes mellitus with other specified complication: Secondary | ICD-10-CM

## 2021-11-13 LAB — BAYER DCA HB A1C WAIVED: HB A1C (BAYER DCA - WAIVED): 6.5 % — ABNORMAL HIGH (ref 4.8–5.6)

## 2021-11-13 MED ORDER — LISINOPRIL 5 MG PO TABS: 5.0000 mg | ORAL_TABLET | Freq: Every day | ORAL | 3 refills | Status: DC

## 2021-11-13 MED ORDER — JANUMET 50-1000 MG PO TABS: 1.0000 | ORAL_TABLET | Freq: Two times a day (BID) | ORAL | 3 refills | Status: DC

## 2021-11-13 NOTE — Progress Notes (Signed)
BP (!) 102/50   Pulse 75   Temp 98.5 F (36.9 C)   Ht 5' 3"  (1.6 m)   Wt 130 lb (59 kg)   SpO2 96%   BMI 23.03 kg/m    Subjective:   Patient ID: Roberto Miller, male    DOB: 1931-06-23, 88 y.o.   MRN: [id number]  HPI: Roberto Miller is a 88 y.o. male presenting on 11/13/2021 for Medical Management of Chronic Issues (3 month)   HPI Type 2 diabetes mellitus Patient comes in today for recheck of his diabetes. Patient has been currently taking Ozempic and Janumet and glipizide. Patient is currently on an ACE inhibitor/ARB. Patient has not seen an ophthalmologist this year. Patient denies any issues with their feet. The symptom started onset as an adult hypertension and hyperlipidemia ARE RELATED TO DM   Hypertension Patient is currently on lisinopril, and their blood pressure today is 102/50. Patient denies any lightheadedness or dizziness. Patient denies headaches, blurred vision, chest pains, shortness of breath, or weakness. Denies any side effects from medication and is content with current medication.   Anemia recheck Patient is coming in for anemia.  He denies any symptoms of lightheadedness dizziness or chest pain or shortness of breath, he says he is feeling good.  We will recheck levels today.  Relevant past medical, surgical, family and social history reviewed and updated as indicated. Interim medical history since our last visit reviewed. Allergies and medications reviewed and updated.  Review of Systems  Constitutional:  Negative for chills and fever.  Eyes:  Negative for visual disturbance.  Respiratory:  Negative for shortness of breath and wheezing.   Cardiovascular:  Negative for chest pain and leg swelling.  Musculoskeletal:  Negative for back pain and gait problem.  Skin:  Negative for rash.  Neurological:  Negative for dizziness, weakness, light-headedness and numbness.  All other systems reviewed and are negative.   Per HPI unless specifically indicated  above   Allergies as of 11/13/2021   No Known Allergies      Medication List        Accurate as of November 13, 2021  4:34 PM. If you have any questions, ask your nurse or doctor.          STOP taking these medications    metFORMIN 1000 MG tablet Commonly known as: GLUCOPHAGE Stopped by: Fransisca Kaufmann Kel Senn, MD       TAKE these medications    Accu-Chek Guide test strip Generic drug: glucose blood TEST BS 3 TIMES DAILY DX E11.9   Accu-Chek Guide w/Device Kit Check blood sugar 1x per day and prn   Dx E11.9   Accu-Chek Softclix Lancets lancets Test BS 3 TIMES DAILY Dx E11.9   diclofenac Sodium 1 % Gel Commonly known as: Voltaren Apply 2 g topically 4 (four) times daily.   ferrous gluconate 324 MG tablet Commonly known as: FERGON TAKE ONE (1) TABLET BY MOUTH EVERY DAY   glipiZIDE 10 MG tablet Commonly known as: GLUCOTROL Take 1 tablet (10 mg total) by mouth daily before breakfast.   hydrocortisone 1 % ointment Apply 1 application topically 2 (two) times daily.   Janumet 50-1000 MG tablet Generic drug: sitaGLIPtin-metformin Take 1 tablet by mouth 2 (two) times daily.   latanoprost 0.005 % ophthalmic solution Commonly known as: XALATAN Place 1 drop into both eyes at bedtime.   lisinopril 5 MG tablet Commonly known as: ZESTRIL Take 1 tablet (5 mg total) by mouth daily.   multivitamin capsule  Take 1 capsule by mouth daily.   Ozempic (0.25 or 0.5 MG/DOSE) 2 MG/3ML Sopn Generic drug: Semaglutide(0.25 or 0.5MG/DOS) Inject 0.5 mg into the skin once a week.   rosuvastatin 5 MG tablet Commonly known as: CRESTOR Take 1 tablet (5 mg total) by mouth daily.   tamsulosin 0.4 MG Caps capsule Commonly known as: FLOMAX Take 1 capsule (0.4 mg total) by mouth daily.         Objective:   BP (!) 102/50   Pulse 75   Temp 98.5 F (36.9 C)   Ht 5' 3"  (1.6 m)   Wt 130 lb (59 kg)   SpO2 96%   BMI 23.03 kg/m   Wt Readings from Last 3 Encounters:  11/13/21  130 lb (59 kg)  09/10/21 143 lb (64.9 kg)  08/13/21 143 lb (64.9 kg)    Physical Exam Vitals and nursing note reviewed.  Constitutional:      General: He is not in acute distress.    Appearance: He is well-developed. He is not diaphoretic.  Eyes:     General: No scleral icterus.    Conjunctiva/sclera: Conjunctivae normal.  Neck:     Thyroid: No thyromegaly.  Cardiovascular:     Rate and Rhythm: Normal rate and regular rhythm.     Heart sounds: Normal heart sounds. No murmur heard. Pulmonary:     Effort: Pulmonary effort is normal. No respiratory distress.     Breath sounds: Normal breath sounds. No wheezing.  Musculoskeletal:        General: No swelling. Normal range of motion.     Cervical back: Neck supple.  Lymphadenopathy:     Cervical: No cervical adenopathy.  Skin:    General: Skin is warm and dry.     Findings: No rash.  Neurological:     Mental Status: He is alert and oriented to person, place, and time.     Coordination: Coordination normal.  Psychiatric:        Behavior: Behavior normal.       Assessment & Plan:   Problem List Items Addressed This Visit       Cardiovascular and Mediastinum   Hypertension associated with diabetes ([place])   Relevant Medications   lisinopril (ZESTRIL) 5 MG tablet   JANUMET 50-1000 MG tablet     Endocrine   Diabetes (HCC) - Primary   Relevant Medications   lisinopril (ZESTRIL) 5 MG tablet   JANUMET 50-1000 MG tablet   Other Relevant Orders   Bayer DCA Hb A1c Waived     Other   Iron deficiency anemia   Relevant Orders   Lipid panel   CMP14+EGFR   CBC with Differential/Platelet   VITAMIN D 25 Hydroxy (Vit-D Deficiency, Fractures)   Thyroid Panel With TSH  A1c looks good at 6.5.  No change medication.  Blood pressure on the lower side but is asymptomatic so we will monitor for now and encouraged hydration.  Follow up plan: Return in about 3 months (around 02/13/2022), or if symptoms worsen or fail to improve, for  Diabetes and hypertension and iron deficiency.  Counseling provided for all of the vaccine components Orders Placed This Encounter  Procedures   Lipid panel   Bayer DCA Hb A1c Waived   CMP14+EGFR   CBC with Differential/Platelet   VITAMIN D 25 Hydroxy (Vit-D Deficiency, Fractures)   Thyroid Panel With TSH    Caryl Pina, MD [facility] 11/13/2021, 4:34 PM

## 2021-11-14 LAB — CBC WITH DIFFERENTIAL/PLATELET
Basophils Absolute: 0 10*3/uL (ref 0.0–0.2)
Basos: 1 %
EOS (ABSOLUTE): 0.1 10*3/uL (ref 0.0–0.4)
Eos: 2 %
Hematocrit: 31.8 % — ABNORMAL LOW (ref 37.5–51.0)
Hemoglobin: 10.7 g/dL — ABNORMAL LOW (ref 13.0–17.7)
Immature Grans (Abs): 0 10*3/uL (ref 0.0–0.1)
Immature Granulocytes: 0 %
Lymphocytes Absolute: 0.8 10*3/uL (ref 0.7–3.1)
Lymphs: 24 %
MCH: 29.8 pg (ref 26.6–33.0)
MCHC: 33.6 g/dL (ref 31.5–35.7)
MCV: 89 fL (ref 79–97)
Monocytes Absolute: 0.3 10*3/uL (ref 0.1–0.9)
Monocytes: 10 %
Neutrophils Absolute: 2.1 10*3/uL (ref 1.4–7.0)
Neutrophils: 63 %
Platelets: 171 10*3/uL (ref 150–450)
RBC: 3.59 x10E6/uL — ABNORMAL LOW (ref 4.14–5.80)
RDW: 13.3 % (ref 11.6–15.4)
WBC: 3.4 10*3/uL (ref 3.4–10.8)

## 2021-11-14 LAB — CMP14+EGFR
ALT: 12 IU/L (ref 0–44)
AST: 22 IU/L (ref 0–40)
Albumin/Globulin Ratio: 1.8 (ref 1.2–2.2)
Albumin: 4.3 g/dL (ref 3.7–4.7)
Alkaline Phosphatase: 81 IU/L (ref 44–121)
BUN/Creatinine Ratio: 24 (ref 10–24)
BUN: 29 mg/dL — ABNORMAL HIGH (ref 8–27)
Bilirubin Total: 0.4 mg/dL (ref 0.0–1.2)
CO2: 23 mmol/L (ref 20–29)
Calcium: 9.8 mg/dL (ref 8.6–10.2)
Chloride: 101 mmol/L (ref 96–106)
Creatinine, Ser: 1.23 mg/dL (ref 0.76–1.27)
Globulin, Total: 2.4 g/dL (ref 1.5–4.5)
Glucose: 200 mg/dL — ABNORMAL HIGH (ref 70–99)
Potassium: 4.8 mmol/L (ref 3.5–5.2)
Sodium: 138 mmol/L (ref 134–144)
Total Protein: 6.7 g/dL (ref 6.0–8.5)
eGFR: 56 mL/min/{1.73_m2} — ABNORMAL LOW (ref >59)

## 2021-11-14 LAB — LIPID PANEL
Chol/HDL Ratio: 1.7 ratio (ref 0.0–5.0)
Cholesterol, Total: 127 mg/dL (ref 100–199)
HDL: 75 mg/dL (ref >39)
LDL Chol Calc (NIH): 39 mg/dL (ref 0–99)
Triglycerides: 58 mg/dL (ref 0–149)
VLDL Cholesterol Cal: 13 mg/dL (ref 5–40)

## 2021-11-14 LAB — VITAMIN D 25 HYDROXY (VIT D DEFICIENCY, FRACTURES): Vit D, 25-Hydroxy: 41.7 ng/mL (ref 30.0–100.0)

## 2021-11-14 LAB — THYROID PANEL WITH TSH
Free Thyroxine Index: 1.6 (ref 1.2–4.9)
T3 Uptake Ratio: 26 % (ref 24–39)
T4, Total: 6.2 ug/dL (ref 4.5–12.0)
TSH: 2.46 u[IU]/mL (ref 0.450–4.500)

## 2021-11-30 ENCOUNTER — Telehealth: Payer: Self-pay | Admitting: Family Medicine

## 2021-11-30 DIAGNOSIS — E1169 Type 2 diabetes mellitus with other specified complication: Secondary | ICD-10-CM

## 2021-11-30 NOTE — Telephone Encounter (Signed)
Called pt- he is confused- I am unable to speak to veronica- if she calls back please get an updated phone number

## 2021-12-01 NOTE — Telephone Encounter (Signed)
Spoke with [place]. She states that pt informed her that the pen is hard to push and he believes he is not getting the medication. Pt takes injection every Monday. Offered to schedule an appt with nurse today but pt cannot come today. Appt made for 9/13 at 3pm with nurse. Aware to bring Ozempic pen with them. Per med list pt is supposed to be injecting 0.5mg  weekly.

## 2021-12-02 ENCOUNTER — Ambulatory Visit (INDEPENDENT_AMBULATORY_CARE_PROVIDER_SITE_OTHER): Payer: Medicare Other | Admitting: *Deleted

## 2021-12-02 DIAGNOSIS — E1169 Type 2 diabetes mellitus with other specified complication: Secondary | ICD-10-CM

## 2021-12-02 MED ORDER — OZEMPIC (0.25 OR 0.5 MG/DOSE) 2 MG/3ML ~~LOC~~ SOPN: 0.5000 mg | PEN_INJECTOR | SUBCUTANEOUS | 3 refills | Status: DC

## 2021-12-02 NOTE — Progress Notes (Signed)
Instructions on how to use ozempic given. Daughter and patient verbalized understanding. Patient also administered while in office and administered ozempic correctly.

## 2021-12-02 NOTE — Addendum Note (Signed)
Addended by: Tamera Punt on: 12/02/2021 03:19 PM   Modules accepted: Orders

## 2022-02-18 ENCOUNTER — Encounter: Payer: Self-pay | Admitting: Family Medicine

## 2022-02-18 ENCOUNTER — Ambulatory Visit (INDEPENDENT_AMBULATORY_CARE_PROVIDER_SITE_OTHER): Payer: Medicare Other | Admitting: Family Medicine

## 2022-02-18 VITALS — BP 111/65 | HR 62 | Temp 97.1°F | Ht 63.0 in | Wt 129.0 lb

## 2022-02-18 DIAGNOSIS — D508 Other iron deficiency anemias: Secondary | ICD-10-CM

## 2022-02-18 DIAGNOSIS — Z23 Encounter for immunization: Secondary | ICD-10-CM | POA: Diagnosis not present

## 2022-02-18 DIAGNOSIS — E1169 Type 2 diabetes mellitus with other specified complication: Secondary | ICD-10-CM | POA: Diagnosis not present

## 2022-02-18 DIAGNOSIS — I152 Hypertension secondary to endocrine disorders: Secondary | ICD-10-CM

## 2022-02-18 DIAGNOSIS — E1159 Type 2 diabetes mellitus with other circulatory complications: Secondary | ICD-10-CM

## 2022-02-18 LAB — BAYER DCA HB A1C WAIVED: HB A1C (BAYER DCA - WAIVED): 7.2 % — ABNORMAL HIGH (ref 4.8–5.6)

## 2022-02-18 MED ORDER — FERROUS GLUCONATE 324 (38 FE) MG PO TABS: ORAL_TABLET | ORAL | 1 refills | Status: DC

## 2022-02-18 NOTE — Progress Notes (Signed)
BP 111/65   Pulse 62   Temp (!) 97.1 F (36.2 C)   Ht _0  (1.6 m)   Wt 129 lb (58.5 kg)   SpO2 98%   BMI 22.85 kg/m    Subjective:   Patient ID: Roberto Miller, male    DOB: 1931/08/31, 88 y.o.   MRN: [id number]  HPI: Roberto Miller is a 88 y.o. male presenting on 02/18/2022 for Medical Management of Chronic Issues and Diabetes   HPI Type 2 diabetes mellitus Patient comes in today for recheck of his diabetes. Patient has been currently taking glipizide and Janumet and Ozempic and Crestor. Patient is currently on an ACE inhibitor/ARB. Patient has seen an ophthalmologist this year. Patient denies any issues with their feet. The symptom started onset as an adult hypertension ARE RELATED TO DM   Hypertension Patient is currently on lisinopril, and their blood pressure today is 111/65. Patient denies any lightheadedness or dizziness. Patient denies headaches, blurred vision, chest pains, shortness of breath, or weakness. Denies any side effects from medication and is content with current medication.   Anemia recheck Patient is coming in for anemia recheck.  He currently takes iron.  Relevant past medical, surgical, family and social history reviewed and updated as indicated. Interim medical history since our last visit reviewed. Allergies and medications reviewed and updated.  Review of Systems  Constitutional:  Negative for chills and fever.  Eyes:  Negative for visual disturbance.  Respiratory:  Negative for shortness of breath and wheezing.   Cardiovascular:  Negative for chest pain and leg swelling.  Musculoskeletal:  Negative for back pain and gait problem.  Skin:  Negative for rash.  Neurological:  Negative for dizziness, weakness and light-headedness.  All other systems reviewed and are negative.   Per HPI unless specifically indicated above   Allergies as of 02/18/2022   No Known Allergies      Medication List        Accurate as of February 18, 2022   3:37 PM. If you have any questions, ask your nurse or doctor.          Accu-Chek Guide test strip Generic drug: glucose blood TEST BS 3 TIMES DAILY DX E11.9   Accu-Chek Guide w/Device Kit Check blood sugar 1x per day and prn   Dx E11.9   Accu-Chek Softclix Lancets lancets Test BS 3 TIMES DAILY Dx E11.9   diclofenac Sodium 1 % Gel Commonly known as: Voltaren Apply 2 g topically 4 (four) times daily.   ferrous gluconate 324 MG tablet Commonly known as: FERGON TAKE ONE (1) TABLET BY MOUTH EVERY DAY What changed:  how to take this when to take this additional instructions Changed by: Fransisca Kaufmann Mechele Kittleson, MD   glipiZIDE 10 MG tablet Commonly known as: GLUCOTROL Take 1 tablet (10 mg total) by mouth daily before breakfast.   hydrocortisone 1 % ointment Apply 1 application topically 2 (two) times daily.   Janumet 50-1000 MG tablet Generic drug: sitaGLIPtin-metformin Take 1 tablet by mouth 2 (two) times daily.   latanoprost 0.005 % ophthalmic solution Commonly known as: XALATAN Place 1 drop into both eyes at bedtime.   lisinopril 5 MG tablet Commonly known as: ZESTRIL Take 1 tablet (5 mg total) by mouth daily.   multivitamin capsule Take 1 capsule by mouth daily.   Ozempic (0.25 or 0.5 MG/DOSE) 2 MG/3ML Sopn Generic drug: Semaglutide(0.25 or 0.5MG/DOS) Inject 0.5 mg into the skin once a week.   rosuvastatin 5 MG tablet Commonly  known as: CRESTOR Take 1 tablet (5 mg total) by mouth daily.   tamsulosin 0.4 MG Caps capsule Commonly known as: FLOMAX Take 1 capsule (0.4 mg total) by mouth daily.         Objective:   BP 111/65   Pulse 62   Temp (!) 97.1 F (36.2 C)   Ht _0  (1.6 m)   Wt 129 lb (58.5 kg)   SpO2 98%   BMI 22.85 kg/m   Wt Readings from Last 3 Encounters:  02/18/22 129 lb (58.5 kg)  11/13/21 130 lb (59 kg)  09/10/21 143 lb (64.9 kg)    Physical Exam Vitals and nursing note reviewed.  Constitutional:      General: He is not in acute  distress.    Appearance: He is well-developed. He is not diaphoretic.  Eyes:     General: No scleral icterus.    Conjunctiva/sclera: Conjunctivae normal.  Neck:     Thyroid: No thyromegaly.  Cardiovascular:     Rate and Rhythm: Normal rate and regular rhythm.     Heart sounds: Normal heart sounds. No murmur heard. Pulmonary:     Effort: Pulmonary effort is normal. No respiratory distress.     Breath sounds: Normal breath sounds. No wheezing.  Musculoskeletal:        General: No swelling. Normal range of motion.     Cervical back: Neck supple.  Lymphadenopathy:     Cervical: No cervical adenopathy.  Skin:    General: Skin is warm and dry.     Findings: No rash.  Neurological:     Mental Status: He is alert and oriented to person, place, and time.     Coordination: Coordination normal.  Psychiatric:        Behavior: Behavior normal.       Assessment & Plan:   Problem List Items Addressed This Visit       Cardiovascular and Mediastinum   Hypertension associated with diabetes ([place])     Endocrine   Diabetes ([place]) - Primary   Relevant Orders   Bayer DCA Hb A1c Waived     Other   Iron deficiency anemia   Relevant Medications   ferrous gluconate (FERGON) 324 MG tablet    A1c slightly up at 7.2, focus on diet medication at this point. Follow up plan: Return in about 3 months (around 05/20/2022), or if symptoms worsen or fail to improve, for Diabetes.  Counseling provided for all of the vaccine components Orders Placed This Encounter  Procedures   Bayer [place] Hb A1c [place] Sharika Mosquera, MD [facility] 02/18/2022, 3:37 PM

## 2022-03-29 ENCOUNTER — Ambulatory Visit (INDEPENDENT_AMBULATORY_CARE_PROVIDER_SITE_OTHER): Payer: 59 | Admitting: Family Medicine

## 2022-03-29 ENCOUNTER — Encounter: Payer: Self-pay | Admitting: Family Medicine

## 2022-03-29 VITALS — BP 107/59 | HR 66 | Temp 97.4°F | Ht 63.0 in | Wt 135.0 lb

## 2022-03-29 DIAGNOSIS — H60331 Swimmer's ear, right ear: Secondary | ICD-10-CM | POA: Diagnosis not present

## 2022-03-29 MED ORDER — NEOMYCIN-POLYMYXIN-HC 3.5-10000-1 OT SOLN: 3.0000 [drp] | Freq: Four times a day (QID) | OTIC | 0 refills | Status: AC

## 2022-03-29 NOTE — Progress Notes (Signed)
BP (!) 107/59   Pulse 66   Temp (!) 97.4 F (36.3 C)   Ht 5\' 3"  (1.6 m)   Wt 135 lb (61.2 kg)   SpO2 96%   BMI 23.91 kg/m    Subjective:   Patient ID: , male    DOB: 08-17-1931, 89 y.o.   MRN: [id number]  HPI: Roberto Miller is a 89 y.o. male presenting on 03/29/2022 for Ear Pain (Right, present for 1w. Bloody drainage.)   HPI Right ear drainage and stopped up Patient is coming in today complaining of right ear drainage and feeling stopped up.  He says it has been going on for about a week and he feels stopped up and he cannot hear and then he will get some drainage out of that right ear and he feels like the drainage is bloody in nature or red.  He denies any pain or fevers or chills or congestion or sinus pressure.  He does not know what brought it on but it just came on all of a sudden.  Relevant past medical, surgical, family and social history reviewed and updated as indicated. Interim medical history since our last visit reviewed. Allergies and medications reviewed and updated.  Review of Systems  Constitutional:  Negative for chills and fever.  Eyes:  Negative for visual disturbance.  Respiratory:  Negative for shortness of breath and wheezing.   Cardiovascular:  Negative for chest pain and leg swelling.  Musculoskeletal:  Negative for back pain and gait problem.  Skin:  Negative for rash.  Neurological:  Negative for dizziness, weakness and numbness.  All other systems reviewed and are negative.   Per HPI unless specifically indicated above   Allergies as of 03/29/2022   No Known Allergies      Medication List        Accurate as of March 29, 2022  2:22 PM. If you have any questions, ask your nurse or doctor.          Accu-Chek Guide test strip Generic drug: glucose blood TEST BS 3 TIMES DAILY DX E11.9   Accu-Chek Guide w/Device Kit Check blood sugar 1x per day and prn   Dx E11.9   Accu-Chek Softclix Lancets lancets Test BS 3 TIMES  DAILY Dx E11.9   diclofenac Sodium 1 % Gel Commonly known as: Voltaren Apply 2 g topically 4 (four) times daily.   ferrous gluconate 324 MG tablet Commonly known as: FERGON TAKE ONE (1) TABLET BY MOUTH EVERY DAY   glipiZIDE 10 MG tablet Commonly known as: GLUCOTROL Take 1 tablet (10 mg total) by mouth daily before breakfast.   hydrocortisone 1 % ointment Apply 1 application topically 2 (two) times daily.   Janumet 50-1000 MG tablet Generic drug: sitaGLIPtin-metformin Take 1 tablet by mouth 2 (two) times daily.   latanoprost 0.005 % ophthalmic solution Commonly known as: XALATAN Place 1 drop into both eyes at bedtime.   lisinopril 5 MG tablet Commonly known as: ZESTRIL Take 1 tablet (5 mg total) by mouth daily.   multivitamin capsule Take 1 capsule by mouth daily.   neomycin-polymyxin-hydrocortisone OTIC solution Commonly known as: CORTISPORIN Place 3 drops into the right ear 4 (four) times daily for 7 days. Started by: March 31, 2022 Jimi Schappert, MD   Ozempic (0.25 or 0.5 MG/DOSE) 2 MG/3ML Sopn Generic drug: Semaglutide(0.25 or 0.5MG /DOS) Inject 0.5 mg into the skin once a week.   rosuvastatin 5 MG tablet Commonly known as: CRESTOR Take 1 tablet (5 mg total) by  mouth daily.   tamsulosin 0.4 MG Caps capsule Commonly known as: FLOMAX Take 1 capsule (0.4 mg total) by mouth daily.         Objective:   BP (!) 107/59   Pulse 66   Temp (!) 97.4 F (36.3 C)   Ht 5\' 3"  (1.6 m)   Wt 135 lb (61.2 kg)   SpO2 96%   BMI 23.91 kg/m   Wt Readings from Last 3 Encounters:  03/29/22 135 lb (61.2 kg)  02/18/22 129 lb (58.5 kg)  11/13/21 130 lb (59 kg)    Physical Exam Vitals and nursing note reviewed.  Constitutional:      General: He is not in acute distress.    Appearance: He is well-developed. He is not diaphoretic.  HENT:     Right Ear: Drainage and swelling present. No tenderness. No middle ear effusion. Tympanic membrane is not injected or scarred.     Left Ear:  Tympanic membrane, ear canal and external ear normal.  Eyes:     General: No scleral icterus.    Conjunctiva/sclera: Conjunctivae normal.  Neck:     Thyroid: No thyromegaly.  Skin:    General: Skin is warm and dry.     Findings: No rash.  Neurological:     Mental Status: He is alert and oriented to person, place, and time.     Coordination: Coordination normal.  Psychiatric:        Behavior: Behavior normal.       Assessment & Plan:   Problem List Items Addressed This Visit   None Visit Diagnoses     Acute swimmer's ear of right side    -  Primary   Relevant Medications   neomycin-polymyxin-hydrocortisone (CORTISPORIN) OTIC solution       Looks like otitis externa on exam, we will treat with antibiotic drops Follow up plan: Return if symptoms worsen or fail to improve.  Counseling provided for all of the vaccine components No orders of the defined types were placed in this encounter.   Caryl Pina, MD [facility] 03/29/2022, 2:22 PM

## 2022-04-06 ENCOUNTER — Encounter: Payer: Self-pay | Admitting: Family Medicine

## 2022-04-06 ENCOUNTER — Ambulatory Visit (INDEPENDENT_AMBULATORY_CARE_PROVIDER_SITE_OTHER): Payer: 59 | Admitting: Family Medicine

## 2022-04-06 VITALS — BP 121/58 | HR 65 | Temp 96.8°F | Ht 63.0 in | Wt 134.2 lb

## 2022-04-06 DIAGNOSIS — H6121 Impacted cerumen, right ear: Secondary | ICD-10-CM | POA: Diagnosis not present

## 2022-04-06 DIAGNOSIS — H60311 Diffuse otitis externa, right ear: Secondary | ICD-10-CM

## 2022-04-06 MED ORDER — CIPROFLOXACIN-DEXAMETHASONE 0.3-0.1 % OT SUSP
4.0000 [drp] | Freq: Two times a day (BID) | OTIC | 0 refills | Status: AC
Start: 2022-04-06 — End: 2022-04-13

## 2022-04-06 NOTE — Progress Notes (Signed)
Subjective:  Patient ID: Roberto Miller, male    DOB: March 18, 1932, 89 y.o.   MRN: [id number]  Patient Care Team: [name], Elige Radon, MD as PCP - General (Family Medicine) Danella Maiers, [facility] as Pharmacist (Family Medicine) Dione Booze, Bertram Millard, MD as Consulting Physician (Ophthalmology)   Chief Complaint:  actue swimmers ear of right side (Patient was seen 03/29/22 and states that the bleeding in ear has stopped but ear still feels "stuffy". )  Interpreter used for entire visit: Ozzy # [id number]  HPI: Roberto Miller is a 89 y.o. male presenting on 04/06/2022 for actue swimmers ear of right side (Patient was seen 03/29/22 and states that the bleeding in ear has stopped but ear still feels "stuffy". )   Pt presents with persistent right otalgia and fullness. Still has some drainage, not like it was prior to last visit.   Otalgia  There is pain in the right ear. This is a recurrent problem. Episode onset: was seen for same 03/29/2022 and placed on drops. The problem occurs constantly. The problem has been waxing and waning. There has been no fever. The pain is mild. Associated symptoms include ear discharge and hearing loss. Pertinent negatives include no abdominal pain, coughing, diarrhea, headaches, neck pain, rash, rhinorrhea, sore throat or vomiting. He has tried ear drops for the symptoms. The treatment provided mild relief.    Relevant past medical, surgical, family, and social history reviewed and updated as indicated.  Allergies and medications reviewed and updated. Data reviewed: Chart in Epic.   Past Medical History:  Diagnosis Date   Diabetes mellitus without complication (HCC)    Hyperlipidemia    Hypertension     Past Surgical History:  Procedure Laterality Date   EYE SURGERY Right     Social History   Socioeconomic History   Marital status: Married    Spouse name: Davonna Belling   Number of children: 9   Years of education: 1   Highest education level: 1st grade   Occupational History   Occupation: retired  Tobacco Use   Smoking status: Never   Smokeless tobacco: Never  Vaping Use   Vaping Use: Never used  Substance and Sexual Activity   Alcohol use: No   Drug use: No   Sexual activity: Not Currently  Other Topics Concern   Not on file  Social History Narrative   Living with daughter in [place] - speaks no English   Social Determinants of Health   Financial Resource Strain: Low Risk  (09/10/2021)   Overall Financial Resource Strain (CARDIA)    Difficulty of Paying Living Expenses: Not hard at all  Food Insecurity: No Food Insecurity (09/10/2021)   Hunger Vital Sign    Worried About Running Out of Food in the Last Year: Never true    Ran Out of Food in the Last Year: Never true  Transportation Needs: No Transportation Needs (09/10/2021)   PRAPARE - Administrator, Civil Service (Medical): No    Lack of Transportation (Non-Medical): No  Physical Activity: Sufficiently Active (09/10/2021)   Exercise Vital Sign    Days of Exercise per Week: 7 days    Minutes of Exercise per Session: 30 min  Stress: No Stress Concern Present (09/10/2021)   [employer] of Occupational Health - Occupational Stress Questionnaire    Feeling of Stress : Not at all  Social Connections: Socially Integrated (09/10/2021)   Social Connection and Isolation Panel [NHANES]    Frequency of Communication with Friends  and Family: More than three times a week    Frequency of Social Gatherings with Friends and Family: More than three times a week    Attends Religious Services: More than 4 times per year    Active Member of [employer] or Organizations: Yes    Attends Archivist Meetings: More than 4 times per year    Marital Status: Married  Human resources officer Violence: Not At Risk (09/10/2021)   Humiliation, Afraid, Rape, and Kick questionnaire    Fear of Current or Ex-Partner: No    Emotionally Abused: No    Physically Abused: No    Sexually Abused: No     Outpatient Encounter Medications as of 04/06/2022  Medication Sig   Accu-Chek Softclix Lancets lancets Test BS 3 TIMES DAILY Dx E11.9   Blood Glucose Monitoring Suppl (ACCU-CHEK GUIDE) w/Device KIT Check blood sugar 1x per day and prn   Dx E11.9   ciprofloxacin-dexamethasone (CIPRODEX) OTIC suspension Place 4 drops into the right ear 2 (two) times daily for 7 days.   diclofenac Sodium (VOLTAREN) 1 % GEL Apply 2 g topically 4 (four) times daily.   ferrous gluconate (FERGON) 324 MG tablet TAKE ONE (1) TABLET BY MOUTH EVERY DAY   glipiZIDE (GLUCOTROL) 10 MG tablet Take 1 tablet (10 mg total) by mouth daily before breakfast.   glucose blood (ACCU-CHEK GUIDE) test strip TEST BS 3 TIMES DAILY DX E11.9   hydrocortisone 1 % ointment Apply 1 application topically 2 (two) times daily.   JANUMET 50-1000 MG tablet Take 1 tablet by mouth 2 (two) times daily.   latanoprost (XALATAN) 0.005 % ophthalmic solution Place 1 drop into both eyes at bedtime.   lisinopril (ZESTRIL) 5 MG tablet Take 1 tablet (5 mg total) by mouth daily.   Multiple Vitamin (MULTIVITAMIN) capsule Take 1 capsule by mouth daily.   rosuvastatin (CRESTOR) 5 MG tablet Take 1 tablet (5 mg total) by mouth daily.   Semaglutide,0.25 or 0.5MG /DOS, (OZEMPIC, 0.25 OR 0.5 MG/DOSE,) 2 MG/3ML SOPN Inject 0.5 mg into the skin once a week.   tamsulosin (FLOMAX) 0.4 MG CAPS capsule Take 1 capsule (0.4 mg total) by mouth daily.   No facility-administered encounter medications on file as of 04/06/2022.    No Known Allergies  Review of Systems  Constitutional:  Negative for activity change, appetite change, chills, fatigue and fever.  HENT:  Positive for ear discharge, ear pain and hearing loss. Negative for rhinorrhea and sore throat.   Eyes: Negative.   Respiratory:  Negative for cough, chest tightness and shortness of breath.   Cardiovascular:  Negative for chest pain, palpitations and leg swelling.  Gastrointestinal:  Negative for abdominal  pain, blood in stool, constipation, diarrhea, nausea and vomiting.  Endocrine: Negative.   Genitourinary:  Negative for dysuria, frequency and urgency.  Musculoskeletal:  Negative for arthralgias, myalgias and neck pain.  Skin: Negative.  Negative for rash.  Allergic/Immunologic: Negative.   Neurological:  Negative for dizziness and headaches.  Hematological: Negative.   Psychiatric/Behavioral:  Negative for confusion, hallucinations, sleep disturbance and suicidal ideas.   All other systems reviewed and are negative.       Objective:  BP (!) 121/58   Pulse 65   Temp (!) 96.8 F (36 C) (Temporal)   Ht 5\' 3"  (1.6 m)   Wt 134 lb 3.2 oz (60.9 kg)   SpO2 99%   BMI 23.77 kg/m    Wt Readings from Last 3 Encounters:  04/06/22 134 lb 3.2 oz (60.9  kg)  03/29/22 135 lb (61.2 kg)  02/18/22 129 lb (58.5 kg)    Physical Exam Vitals and nursing note reviewed.  Constitutional:      General: He is not in acute distress.    Appearance: Normal appearance. He is not ill-appearing, toxic-appearing or diaphoretic.  HENT:     Head: Normocephalic and atraumatic.     Right Ear: Swelling and tenderness present. There is impacted cerumen.     Left Ear: Tympanic membrane, ear canal and external ear normal.     Mouth/Throat:     Mouth: Mucous membranes are moist.  Eyes:     Pupils: Pupils are equal, round, and reactive to light.  Cardiovascular:     Rate and Rhythm: Normal rate and regular rhythm.     Heart sounds: Normal heart sounds.  Pulmonary:     Effort: Pulmonary effort is normal.     Breath sounds: Normal breath sounds.  Skin:    General: Skin is warm and dry.     Capillary Refill: Capillary refill takes less than 2 seconds.  Neurological:     General: No focal deficit present.     Mental Status: He is alert and oriented to person, place, and time.  Psychiatric:        Mood and Affect: Mood normal.        Behavior: Behavior normal.        Thought Content: Thought content normal.         Judgment: Judgment normal.     Results for orders placed or performed in visit on 02/18/22  Bayer DCA Hb A1c Waived  Result Value Ref Range   HB A1C (BAYER DCA - WAIVED) 7.2 (H) 4.8 - 5.6 %     Ear Cerumen Removal  Date/Time: 04/06/2022 11:59 AM  Performed by: Baruch Gouty, FNP Authorized by: Baruch Gouty, FNP   Anesthesia: Local Anesthetic: none Location details: right ear Patient tolerance: patient tolerated the procedure well with no immediate complications Comments: TM normal post irrigation, canal with diffuse redness and slight swelling Procedure type: irrigation  Sedation: Patient sedated: no       Pertinent labs & imaging results that were available during my care of the patient were reviewed by me and considered in my medical decision making.  Assessment & Plan:  [place] was seen today for actue swimmers ear of right side.  Diagnoses and all orders for this visit:  Acute diffuse otitis externa of right ear Continued despite treatment with cortisporin. Cerumen removed in office via irrigation. Will treat with below. Pt aware to report new, worsening, or persistent symptoms.  -     ciprofloxacin-dexamethasone (CIPRODEX) OTIC suspension; Place 4 drops into the right ear 2 (two) times daily for 7 days.  Impacted cerumen of right ear Tolerated well.  -     Ear Cerumen Removal     Continue all other maintenance medications.  Follow up plan: Return if symptoms worsen or fail to improve.   Continue healthy lifestyle choices, including diet (rich in fruits, vegetables, and lean proteins, and low in salt and simple carbohydrates) and exercise (at least 30 minutes of moderate physical activity daily).  Educational handout given for otitis externa  The above assessment and management plan was discussed with the patient. The patient verbalized understanding of and has agreed to the management plan. Patient is aware to call the clinic if they develop any  new symptoms or if symptoms persist or worsen. Patient is aware when  to return to the clinic for a follow-up visit. Patient educated on when it is appropriate to go to the emergency department.   Monia Pouch, FNP-C [facility] [phone]

## 2022-05-20 ENCOUNTER — Ambulatory Visit (INDEPENDENT_AMBULATORY_CARE_PROVIDER_SITE_OTHER): Payer: Medicare HMO

## 2022-05-20 ENCOUNTER — Ambulatory Visit (INDEPENDENT_AMBULATORY_CARE_PROVIDER_SITE_OTHER): Payer: Medicare HMO | Admitting: Family Medicine

## 2022-05-20 ENCOUNTER — Encounter: Payer: Self-pay | Admitting: Family Medicine

## 2022-05-20 VITALS — BP 119/59 | HR 78 | Ht 63.0 in | Wt 128.0 lb

## 2022-05-20 DIAGNOSIS — M25512 Pain in left shoulder: Secondary | ICD-10-CM

## 2022-05-20 DIAGNOSIS — I152 Hypertension secondary to endocrine disorders: Secondary | ICD-10-CM

## 2022-05-20 DIAGNOSIS — E1169 Type 2 diabetes mellitus with other specified complication: Secondary | ICD-10-CM

## 2022-05-20 DIAGNOSIS — D508 Other iron deficiency anemias: Secondary | ICD-10-CM | POA: Diagnosis not present

## 2022-05-20 DIAGNOSIS — E1159 Type 2 diabetes mellitus with other circulatory complications: Secondary | ICD-10-CM | POA: Diagnosis not present

## 2022-05-20 DIAGNOSIS — N401 Enlarged prostate with lower urinary tract symptoms: Secondary | ICD-10-CM | POA: Diagnosis not present

## 2022-05-20 DIAGNOSIS — M19012 Primary osteoarthritis, left shoulder: Secondary | ICD-10-CM | POA: Diagnosis not present

## 2022-05-20 DIAGNOSIS — R350 Frequency of micturition: Secondary | ICD-10-CM | POA: Diagnosis not present

## 2022-05-20 LAB — BAYER DCA HB A1C WAIVED: HB A1C (BAYER DCA - WAIVED): 7.4 % — ABNORMAL HIGH (ref 4.8–5.6)

## 2022-05-20 MED ORDER — TAMSULOSIN HCL 0.4 MG PO CAPS: 0.4000 mg | ORAL_CAPSULE | Freq: Every day | ORAL | 3 refills | Status: DC

## 2022-05-20 MED ORDER — GLIPIZIDE 10 MG PO TABS: 10.0000 mg | ORAL_TABLET | Freq: Every day | ORAL | 3 refills | Status: DC

## 2022-05-20 MED ORDER — ROSUVASTATIN CALCIUM 5 MG PO TABS: 5.0000 mg | ORAL_TABLET | Freq: Every day | ORAL | 3 refills | Status: DC

## 2022-05-20 NOTE — Progress Notes (Signed)
BP (!) 119/59   Pulse 78   Ht '5\' 3"'$  (1.6 m)   Wt 128 lb (58.1 kg)   SpO2 99%   BMI 22.67 kg/m    Subjective:   Patient ID: Roberto Miller, male    DOB: Dec 21, 1931, 89 y.o.   MRN: [id number]  HPI: Roberto Miller is a 89 y.o. male presenting on 05/20/2022 for No chief complaint on file.   HPI Type 2 diabetes mellitus Patient comes in today for recheck of his diabetes. Patient has been currently taking Janumet and Ozempic and glipizide. Patient is currently on an ACE inhibitor/ARB. Patient has not seen an ophthalmologist this year. Patient denies any issues with their feet. The symptom started onset as an adult hypertension ARE RELATED TO DM   Hypertension Patient is currently on lisinopril, and their blood pressure today is 119/59. Patient denies any lightheadedness or dizziness. Patient denies headaches, blurred vision, chest pains, shortness of breath, or weakness. Denies any side effects from medication and is content with current medication.   Iron deficiency anemia recheck Patient is coming in today for iron deficiency anemia recheck.  He denies any symptoms of lightheadedness or dizziness.  Relevant past medical, surgical, family and social history reviewed and updated as indicated. Interim medical history since our last visit reviewed. Allergies and medications reviewed and updated.  Review of Systems  Constitutional:  Negative for chills and fever.  Eyes:  Negative for visual disturbance.  Respiratory:  Negative for shortness of breath and wheezing.   Cardiovascular:  Negative for chest pain and leg swelling.  Musculoskeletal:  Negative for back pain and gait problem.  Skin:  Negative for rash.  Neurological:  Negative for dizziness, weakness and light-headedness.  All other systems reviewed and are negative.   Per HPI unless specifically indicated above   Allergies as of 05/20/2022   No Known Allergies      Medication List        Accurate as of May 20, 2022  3:42 PM. If you have any questions, ask your nurse or doctor.          Accu-Chek Guide test strip Generic drug: glucose blood TEST BS 3 TIMES DAILY DX E11.9   Accu-Chek Guide w/Device Kit Check blood sugar 1x per day and prn   Dx E11.9   Accu-Chek Softclix Lancets lancets Test BS 3 TIMES DAILY Dx E11.9   diclofenac Sodium 1 % Gel Commonly known as: Voltaren Apply 2 g topically 4 (four) times daily.   ferrous gluconate 324 MG tablet Commonly known as: FERGON TAKE ONE (1) TABLET BY MOUTH EVERY DAY   glipiZIDE 10 MG tablet Commonly known as: GLUCOTROL Take 1 tablet (10 mg total) by mouth daily before breakfast.   hydrocortisone 1 % ointment Apply 1 application topically 2 (two) times daily.   Janumet 50-1000 MG tablet Generic drug: sitaGLIPtin-metformin Take 1 tablet by mouth 2 (two) times daily.   latanoprost 0.005 % ophthalmic solution Commonly known as: XALATAN Place 1 drop into both eyes at bedtime.   lisinopril 5 MG tablet Commonly known as: ZESTRIL Take 1 tablet (5 mg total) by mouth daily.   multivitamin capsule Take 1 capsule by mouth daily.   Ozempic (0.25 or 0.5 MG/DOSE) 2 MG/3ML Sopn Generic drug: Semaglutide(0.25 or 0.'5MG'$ /DOS) Inject 0.5 mg into the skin once a week.   rosuvastatin 5 MG tablet Commonly known as: CRESTOR Take 1 tablet (5 mg total) by mouth daily.   tamsulosin 0.4 MG Caps capsule Commonly  known as: FLOMAX Take 1 capsule (0.4 mg total) by mouth daily.         Objective:   BP (!) 119/59   Pulse 78   Ht '5\' 3"'$  (1.6 m)   Wt 128 lb (58.1 kg)   SpO2 99%   BMI 22.67 kg/m   Wt Readings from Last 3 Encounters:  05/20/22 128 lb (58.1 kg)  04/06/22 134 lb 3.2 oz (60.9 kg)  03/29/22 135 lb (61.2 kg)    Physical Exam Vitals and nursing note reviewed.  Constitutional:      General: He is not in acute distress.    Appearance: He is well-developed. He is not diaphoretic.  Eyes:     General: No scleral icterus.     Conjunctiva/sclera: Conjunctivae normal.  Neck:     Thyroid: No thyromegaly.  Cardiovascular:     Rate and Rhythm: Normal rate and regular rhythm.     Heart sounds: Normal heart sounds. No murmur heard. Pulmonary:     Effort: Pulmonary effort is normal. No respiratory distress.     Breath sounds: Normal breath sounds. No wheezing.  Musculoskeletal:        General: Normal range of motion.     Cervical back: Neck supple.  Lymphadenopathy:     Cervical: No cervical adenopathy.  Skin:    General: Skin is warm and dry.     Findings: No rash.  Neurological:     Mental Status: He is alert and oriented to person, place, and time.     Coordination: Coordination normal.  Psychiatric:        Behavior: Behavior normal.       Assessment & Plan:   Problem List Items Addressed This Visit       Cardiovascular and Mediastinum   Hypertension associated with diabetes ([place])   Relevant Medications   glipiZIDE (GLUCOTROL) 10 MG tablet   rosuvastatin (CRESTOR) 5 MG tablet   Other Relevant Orders   CBC with Differential/Platelet   CMP14+EGFR   Lipid panel   PSA, total and free   Bayer DCA Hb A1c Waived (Completed)     Endocrine   Diabetes ([place]) - Primary   Relevant Medications   glipiZIDE (GLUCOTROL) 10 MG tablet   rosuvastatin (CRESTOR) 5 MG tablet   Other Relevant Orders   CBC with Differential/Platelet   CMP14+EGFR   Lipid panel   PSA, total and free   Bayer DCA Hb A1c Waived (Completed)     Other   Iron deficiency anemia   Other Visit Diagnoses     Benign prostatic hyperplasia with urinary frequency       Relevant Medications   tamsulosin (FLOMAX) 0.4 MG CAPS capsule   Other Relevant Orders   PSA, total and free   Acute pain of left shoulder       Relevant Orders   DG Shoulder Left     Patient is still doing Ozempic but has lost a little bit of weight so we will going to increase the dose.  Keep doing the dose that he is doing.  Patient's only issue today is that  he had a fall back in October on his left shoulder when he was in [place] and had been in proving some and getting slightly better but he still has some pain with therapy wants to get an x-ray to make sure nothing happened.  He does not want anything else today and has been doing some muscle rubs and feels like it is okay.  Follow up plan: Return in about 3 months (around 08/18/2022), or if symptoms worsen or fail to improve, for Hypertension and diabetes.  Counseling provided for all of the vaccine components Orders Placed This Encounter  Procedures   DG Shoulder Left   CBC with Differential/Platelet   CMP14+EGFR   Lipid panel   PSA, total and free   Bayer DCA Hb A1c Waived    Caryl Pina, MD [facility] 05/20/2022, 3:42 PM

## 2022-05-21 LAB — CBC WITH DIFFERENTIAL/PLATELET
Basophils Absolute: 0 10*3/uL (ref 0.0–0.2)
Basos: 1 %
EOS (ABSOLUTE): 0.1 10*3/uL (ref 0.0–0.4)
Eos: 2 %
Hematocrit: 34.7 % — ABNORMAL LOW (ref 37.5–51.0)
Hemoglobin: 11.6 g/dL — ABNORMAL LOW (ref 13.0–17.7)
Immature Grans (Abs): 0 10*3/uL (ref 0.0–0.1)
Immature Granulocytes: 0 %
Lymphocytes Absolute: 1 10*3/uL (ref 0.7–3.1)
Lymphs: 19 %
MCH: 29.7 pg (ref 26.6–33.0)
MCHC: 33.4 g/dL (ref 31.5–35.7)
MCV: 89 fL (ref 79–97)
Monocytes Absolute: 0.4 10*3/uL (ref 0.1–0.9)
Monocytes: 7 %
Neutrophils Absolute: 3.8 10*3/uL (ref 1.4–7.0)
Neutrophils: 71 %
Platelets: 202 10*3/uL (ref 150–450)
RBC: 3.9 x10E6/uL — ABNORMAL LOW (ref 4.14–5.80)
RDW: 13.6 % (ref 11.6–15.4)
WBC: 5.3 10*3/uL (ref 3.4–10.8)

## 2022-05-21 LAB — PSA, TOTAL AND FREE
PSA, Free Pct: 21 %
PSA, Free: 0.86 ng/mL
Prostate Specific Ag, Serum: 4.1 ng/mL — ABNORMAL HIGH (ref 0.0–4.0)

## 2022-05-21 LAB — CMP14+EGFR
ALT: 11 IU/L (ref 0–44)
AST: 19 IU/L (ref 0–40)
Albumin/Globulin Ratio: 1.6 (ref 1.2–2.2)
Albumin: 4.5 g/dL (ref 3.7–4.7)
Alkaline Phosphatase: 87 IU/L (ref 44–121)
BUN/Creatinine Ratio: 17 (ref 10–24)
BUN: 19 mg/dL (ref 8–27)
Bilirubin Total: 0.3 mg/dL (ref 0.0–1.2)
CO2: 23 mmol/L (ref 20–29)
Calcium: 10.1 mg/dL (ref 8.6–10.2)
Chloride: 98 mmol/L (ref 96–106)
Creatinine, Ser: 1.1 mg/dL (ref 0.76–1.27)
Globulin, Total: 2.8 g/dL (ref 1.5–4.5)
Glucose: 189 mg/dL — ABNORMAL HIGH (ref 70–99)
Potassium: 5.1 mmol/L (ref 3.5–5.2)
Sodium: 134 mmol/L (ref 134–144)
Total Protein: 7.3 g/dL (ref 6.0–8.5)
eGFR: 65 mL/min/{1.73_m2} (ref >59)

## 2022-05-21 LAB — LIPID PANEL
Chol/HDL Ratio: 1.7 ratio (ref 0.0–5.0)
Cholesterol, Total: 142 mg/dL (ref 100–199)
HDL: 85 mg/dL (ref >39)
LDL Chol Calc (NIH): 43 mg/dL (ref 0–99)
Triglycerides: 73 mg/dL (ref 0–149)
VLDL Cholesterol Cal: 14 mg/dL (ref 5–40)

## 2022-05-27 DIAGNOSIS — M9903 Segmental and somatic dysfunction of lumbar region: Secondary | ICD-10-CM | POA: Diagnosis not present

## 2022-05-27 DIAGNOSIS — M9905 Segmental and somatic dysfunction of pelvic region: Secondary | ICD-10-CM | POA: Diagnosis not present

## 2022-05-27 DIAGNOSIS — M62830 Muscle spasm of back: Secondary | ICD-10-CM | POA: Diagnosis not present

## 2022-05-27 DIAGNOSIS — M9904 Segmental and somatic dysfunction of sacral region: Secondary | ICD-10-CM | POA: Diagnosis not present

## 2022-05-31 DIAGNOSIS — M62830 Muscle spasm of back: Secondary | ICD-10-CM | POA: Diagnosis not present

## 2022-05-31 DIAGNOSIS — M9903 Segmental and somatic dysfunction of lumbar region: Secondary | ICD-10-CM | POA: Diagnosis not present

## 2022-05-31 DIAGNOSIS — M9904 Segmental and somatic dysfunction of sacral region: Secondary | ICD-10-CM | POA: Diagnosis not present

## 2022-05-31 DIAGNOSIS — M9905 Segmental and somatic dysfunction of pelvic region: Secondary | ICD-10-CM | POA: Diagnosis not present

## 2022-06-02 DIAGNOSIS — H409 Unspecified glaucoma: Secondary | ICD-10-CM | POA: Diagnosis not present

## 2022-06-02 DIAGNOSIS — Z7985 Long-term (current) use of injectable non-insulin antidiabetic drugs: Secondary | ICD-10-CM | POA: Diagnosis not present

## 2022-06-02 DIAGNOSIS — I10 Essential (primary) hypertension: Secondary | ICD-10-CM | POA: Diagnosis not present

## 2022-06-02 DIAGNOSIS — M9904 Segmental and somatic dysfunction of sacral region: Secondary | ICD-10-CM | POA: Diagnosis not present

## 2022-06-02 DIAGNOSIS — M9905 Segmental and somatic dysfunction of pelvic region: Secondary | ICD-10-CM | POA: Diagnosis not present

## 2022-06-02 DIAGNOSIS — M62830 Muscle spasm of back: Secondary | ICD-10-CM | POA: Diagnosis not present

## 2022-06-02 DIAGNOSIS — E119 Type 2 diabetes mellitus without complications: Secondary | ICD-10-CM | POA: Diagnosis not present

## 2022-06-02 DIAGNOSIS — G8929 Other chronic pain: Secondary | ICD-10-CM | POA: Diagnosis not present

## 2022-06-02 DIAGNOSIS — M9903 Segmental and somatic dysfunction of lumbar region: Secondary | ICD-10-CM | POA: Diagnosis not present

## 2022-06-02 DIAGNOSIS — Z87891 Personal history of nicotine dependence: Secondary | ICD-10-CM | POA: Diagnosis not present

## 2022-06-02 DIAGNOSIS — Z833 Family history of diabetes mellitus: Secondary | ICD-10-CM | POA: Diagnosis not present

## 2022-06-02 DIAGNOSIS — Z7984 Long term (current) use of oral hypoglycemic drugs: Secondary | ICD-10-CM | POA: Diagnosis not present

## 2022-06-02 DIAGNOSIS — E785 Hyperlipidemia, unspecified: Secondary | ICD-10-CM | POA: Diagnosis not present

## 2022-06-03 DIAGNOSIS — M62830 Muscle spasm of back: Secondary | ICD-10-CM | POA: Diagnosis not present

## 2022-06-03 DIAGNOSIS — M9903 Segmental and somatic dysfunction of lumbar region: Secondary | ICD-10-CM | POA: Diagnosis not present

## 2022-06-03 DIAGNOSIS — M9904 Segmental and somatic dysfunction of sacral region: Secondary | ICD-10-CM | POA: Diagnosis not present

## 2022-06-03 DIAGNOSIS — M9905 Segmental and somatic dysfunction of pelvic region: Secondary | ICD-10-CM | POA: Diagnosis not present

## 2022-06-07 DIAGNOSIS — M9904 Segmental and somatic dysfunction of sacral region: Secondary | ICD-10-CM | POA: Diagnosis not present

## 2022-06-07 DIAGNOSIS — M9903 Segmental and somatic dysfunction of lumbar region: Secondary | ICD-10-CM | POA: Diagnosis not present

## 2022-06-07 DIAGNOSIS — M9905 Segmental and somatic dysfunction of pelvic region: Secondary | ICD-10-CM | POA: Diagnosis not present

## 2022-06-07 DIAGNOSIS — M62830 Muscle spasm of back: Secondary | ICD-10-CM | POA: Diagnosis not present

## 2022-06-09 DIAGNOSIS — M9905 Segmental and somatic dysfunction of pelvic region: Secondary | ICD-10-CM | POA: Diagnosis not present

## 2022-06-09 DIAGNOSIS — M9903 Segmental and somatic dysfunction of lumbar region: Secondary | ICD-10-CM | POA: Diagnosis not present

## 2022-06-09 DIAGNOSIS — M9904 Segmental and somatic dysfunction of sacral region: Secondary | ICD-10-CM | POA: Diagnosis not present

## 2022-06-09 DIAGNOSIS — M62830 Muscle spasm of back: Secondary | ICD-10-CM | POA: Diagnosis not present

## 2022-06-10 DIAGNOSIS — M62830 Muscle spasm of back: Secondary | ICD-10-CM | POA: Diagnosis not present

## 2022-06-10 DIAGNOSIS — M9903 Segmental and somatic dysfunction of lumbar region: Secondary | ICD-10-CM | POA: Diagnosis not present

## 2022-06-10 DIAGNOSIS — M9904 Segmental and somatic dysfunction of sacral region: Secondary | ICD-10-CM | POA: Diagnosis not present

## 2022-06-10 DIAGNOSIS — M9905 Segmental and somatic dysfunction of pelvic region: Secondary | ICD-10-CM | POA: Diagnosis not present

## 2022-06-14 DIAGNOSIS — M9904 Segmental and somatic dysfunction of sacral region: Secondary | ICD-10-CM | POA: Diagnosis not present

## 2022-06-14 DIAGNOSIS — M9903 Segmental and somatic dysfunction of lumbar region: Secondary | ICD-10-CM | POA: Diagnosis not present

## 2022-06-14 DIAGNOSIS — M9905 Segmental and somatic dysfunction of pelvic region: Secondary | ICD-10-CM | POA: Diagnosis not present

## 2022-06-14 DIAGNOSIS — M62830 Muscle spasm of back: Secondary | ICD-10-CM | POA: Diagnosis not present

## 2022-06-17 ENCOUNTER — Telehealth: Payer: Self-pay | Admitting: Family Medicine

## 2022-06-17 NOTE — Telephone Encounter (Signed)
Son of pt needs a letter for immigration case/lawyer stating that the son is the one who brings patient to his appointments. Name of son is [facility] Tolan Massingale. Please call Verdene Lennert when letter is ready.

## 2022-06-21 NOTE — Telephone Encounter (Signed)
Yes that is fine to go ahead and do a letter stating that son is the one who needs to bring him to the appointments.

## 2022-06-21 NOTE — Telephone Encounter (Signed)
Roberto Miller has been informed that letter has been completed and she will come by and pick up today. Letter placed up front by [place] in folder S.

## 2022-06-29 ENCOUNTER — Telehealth: Payer: Self-pay | Admitting: Family Medicine

## 2022-06-29 NOTE — Telephone Encounter (Signed)
Pts daughter called stating that the letter that Dr [name] wrote for pt recently also needs to say what pts medical conditions are.

## 2022-06-30 NOTE — Telephone Encounter (Signed)
Okay we can go ahead and redo the letter for them and add the diagnosis of diabetes and hypertension and memory deficits

## 2022-06-30 NOTE — Telephone Encounter (Signed)
Letter ready. Family informed to come by and pick up. Letter in S folder up front.

## 2022-07-01 DIAGNOSIS — H1013 Acute atopic conjunctivitis, bilateral: Secondary | ICD-10-CM | POA: Diagnosis not present

## 2022-07-01 DIAGNOSIS — H40013 Open angle with borderline findings, low risk, bilateral: Secondary | ICD-10-CM | POA: Diagnosis not present

## 2022-08-19 ENCOUNTER — Encounter: Payer: Self-pay | Admitting: Family Medicine

## 2022-08-19 ENCOUNTER — Other Ambulatory Visit: Payer: Self-pay

## 2022-08-19 ENCOUNTER — Ambulatory Visit (INDEPENDENT_AMBULATORY_CARE_PROVIDER_SITE_OTHER): Payer: Medicare HMO | Admitting: Family Medicine

## 2022-08-19 VITALS — BP 129/57 | HR 58 | Ht 63.0 in | Wt 128.0 lb

## 2022-08-19 DIAGNOSIS — E1159 Type 2 diabetes mellitus with other circulatory complications: Secondary | ICD-10-CM

## 2022-08-19 DIAGNOSIS — Z7984 Long term (current) use of oral hypoglycemic drugs: Secondary | ICD-10-CM

## 2022-08-19 DIAGNOSIS — D508 Other iron deficiency anemias: Secondary | ICD-10-CM

## 2022-08-19 DIAGNOSIS — M2041 Other hammer toe(s) (acquired), right foot: Secondary | ICD-10-CM | POA: Diagnosis not present

## 2022-08-19 DIAGNOSIS — M2042 Other hammer toe(s) (acquired), left foot: Secondary | ICD-10-CM

## 2022-08-19 DIAGNOSIS — I152 Hypertension secondary to endocrine disorders: Secondary | ICD-10-CM | POA: Diagnosis not present

## 2022-08-19 DIAGNOSIS — E1169 Type 2 diabetes mellitus with other specified complication: Secondary | ICD-10-CM | POA: Diagnosis not present

## 2022-08-19 DIAGNOSIS — L84 Corns and callosities: Secondary | ICD-10-CM

## 2022-08-19 MED ORDER — OZEMPIC (0.25 OR 0.5 MG/DOSE) 2 MG/3ML ~~LOC~~ SOPN
0.5000 mg | PEN_INJECTOR | SUBCUTANEOUS | 3 refills | Status: DC
Start: 2022-08-19 — End: 2023-03-03

## 2022-08-19 MED ORDER — FERROUS GLUCONATE 324 (38 FE) MG PO TABS: ORAL_TABLET | ORAL | 1 refills | Status: DC

## 2022-08-19 NOTE — Addendum Note (Signed)
Addended by: Arville Care on: 08/19/2022 03:49 PM   Modules accepted: Orders

## 2022-08-19 NOTE — Progress Notes (Addendum)
BP (!) 129/57   Pulse (!) 58   Ht 5\' 3"  (1.6 m)   Wt 128 lb (58.1 kg)   SpO2 99%   BMI 22.67 kg/m    Subjective:   Patient ID: Roberto Miller, male    DOB: 14-Mar-1932, 89 y.o.   MRN: [id number]  HPI: Roberto Miller is a 89 y.o. male presenting on 08/19/2022 for Medical Management of Chronic Issues and Diabetes   HPI Type 2 diabetes mellitus Patient comes in today for recheck of his diabetes. Patient has been currently taking Ozempic and glipizide and Janumet. Patient is currently on an ACE inhibitor/ARB. Patient has not seen an ophthalmologist this year. Patient denies any new issues with their feet. The symptom started onset as an adult hypertension ARE RELATED TO DM   Hypertension Patient is currently on lisinopril, and their blood pressure today is 129/57. Patient denies any lightheadedness or dizziness. Patient denies headaches, blurred vision, chest pains, shortness of breath, or weakness. Denies any side effects from medication and is content with current medication.   Iron deficiency anemia recheck Has iron deficiency anemia and takes iron.  Coming in for recheck today.  Relevant past medical, surgical, family and social history reviewed and updated as indicated. Interim medical history since our last visit reviewed. Allergies and medications reviewed and updated.  Review of Systems  Constitutional:  Negative for chills and fever.  Eyes:  Negative for visual disturbance.  Respiratory:  Negative for shortness of breath and wheezing.   Cardiovascular:  Negative for chest pain and leg swelling.  Musculoskeletal:  Negative for back pain and gait problem.  Skin:  Negative for rash.  Neurological:  Negative for dizziness, weakness and light-headedness.  All other systems reviewed and are negative.   Per HPI unless specifically indicated above   Allergies as of 08/19/2022   No Known Allergies      Medication List        Accurate as of Aug 19, 2022  3:49 PM. If  you have any questions, ask your nurse or doctor.          STOP taking these medications    tamsulosin 0.4 MG Caps capsule Commonly known as: FLOMAX Stopped by: Elige Radon Cerrone Debold, MD       TAKE these medications    Accu-Chek Guide test strip Generic drug: glucose blood TEST BS 3 TIMES DAILY DX E11.9   Accu-Chek Guide w/Device Kit Check blood sugar 1x per day and prn   Dx E11.9   Accu-Chek Softclix Lancets lancets Test BS 3 TIMES DAILY Dx E11.9   diclofenac Sodium 1 % Gel Commonly known as: Voltaren Apply 2 g topically 4 (four) times daily.   ferrous gluconate 324 MG tablet Commonly known as: FERGON TAKE ONE (1) TABLET BY MOUTH EVERY DAY   glipiZIDE 10 MG tablet Commonly known as: GLUCOTROL Take 1 tablet (10 mg total) by mouth daily before breakfast.   hydrocortisone 1 % ointment Apply 1 application topically 2 (two) times daily.   Janumet 50-1000 MG tablet Generic drug: sitaGLIPtin-metformin Take 1 tablet by mouth 2 (two) times daily.   latanoprost 0.005 % ophthalmic solution Commonly known as: XALATAN Place 1 drop into both eyes at bedtime.   lisinopril 5 MG tablet Commonly known as: ZESTRIL Take 1 tablet (5 mg total) by mouth daily.   multivitamin capsule Take 1 capsule by mouth daily.   Ozempic (0.25 or 0.5 MG/DOSE) 2 MG/3ML Sopn Generic drug: Semaglutide(0.25 or 0.5MG /DOS) Inject 0.5 mg into  the skin once a week.   rosuvastatin 5 MG tablet Commonly known as: CRESTOR Take 1 tablet (5 mg total) by mouth daily.               Durable Medical Equipment  (From admission, onward)           Start     Ordered   08/19/22 0000  For Home Use Only DME Diabetic Shoe       Comments: Diagnosis: Diabetes with callus and hammertoes Sized to fit diabetic shoes   08/19/22 1541             Objective:   BP (!) 129/57   Pulse (!) 58   Ht 5\' 3"  (1.6 m)   Wt 128 lb (58.1 kg)   SpO2 99%   BMI 22.67 kg/m   Wt Readings from Last 3  Encounters:  08/19/22 128 lb (58.1 kg)  05/20/22 128 lb (58.1 kg)  04/06/22 134 lb 3.2 oz (60.9 kg)    Physical Exam Vitals and nursing note reviewed.  Constitutional:      General: He is not in acute distress.    Appearance: He is well-developed. He is not diaphoretic.  Eyes:     General: No scleral icterus.    Conjunctiva/sclera: Conjunctivae normal.  Neck:     Thyroid: No thyromegaly.  Cardiovascular:     Rate and Rhythm: Normal rate and regular rhythm.     Heart sounds: Normal heart sounds. No murmur heard. Pulmonary:     Effort: Pulmonary effort is normal. No respiratory distress.     Breath sounds: Normal breath sounds. No wheezing.  Musculoskeletal:        General: Normal range of motion.     Cervical back: Neck supple.  Lymphadenopathy:     Cervical: No cervical adenopathy.  Skin:    General: Skin is warm and dry.     Findings: No rash.  Neurological:     Mental Status: He is alert and oriented to person, place, and time.     Coordination: Coordination normal.  Psychiatric:        Behavior: Behavior normal.     Diabetic Foot Exam - Simple   Simple Foot Form Diabetic Foot exam was performed with the following findings: Yes 08/19/2022  3:39 PM  Visual Inspection See comments: Yes Sensation Testing Intact to touch and monofilament testing bilaterally: Yes Pulse Check Posterior Tibialis and Dorsalis pulse intact bilaterally: Yes Comments Small calluses on the end of the toes, hammertoes on all of his second through fifth digits      Assessment & Plan:   Problem List Items Addressed This Visit       Cardiovascular and Mediastinum   Hypertension associated with diabetes ([facility])   Relevant Medications   Semaglutide,0.25 or 0.5MG /DOS, (OZEMPIC, 0.25 OR 0.5 MG/DOSE,) 2 MG/3ML SOPN     Endocrine   Diabetes ([facility]) - Primary   Relevant Medications   Semaglutide,0.25 or 0.5MG /DOS, (OZEMPIC, 0.25 OR 0.5 MG/DOSE,) 2 MG/3ML SOPN   Other Relevant Orders   CBC with  Differential/Platelet   CMP14+EGFR   For Home Use Only DME Diabetic Shoe   Bayer DCA Hb A1c Waived     Other   Iron deficiency anemia   Relevant Medications   ferrous gluconate (FERGON) 324 MG tablet   Other Visit Diagnoses     Hammer toes of both feet       Relevant Orders   For Home Use Only DME Diabetic Shoe  Foot callus       Relevant Orders   For Home Use Only DME Diabetic Shoe       Continue current medicine, seems to be doing well.  Blood pressure looks good, no change. Follow up plan: Return in about 3 months (around 11/19/2022), or if symptoms worsen or fail to improve, for diabetes and htn and anemia.  Counseling provided for all of the vaccine components Orders Placed This Encounter  Procedures   For Home Use Only DME Diabetic Shoe   CBC with Differential/Platelet   CMP14+EGFR   Bayer DCA Hb A1c Waived    Arville Care, MD [facility] 08/19/2022, 3:49 PM

## 2022-08-20 LAB — CMP14+EGFR
ALT: 14 IU/L (ref 0–44)
AST: 23 IU/L (ref 0–40)
Albumin/Globulin Ratio: 1.6 (ref 1.2–2.2)
Albumin: 4.2 g/dL (ref 3.7–4.7)
Alkaline Phosphatase: 82 IU/L (ref 44–121)
BUN/Creatinine Ratio: 25 — ABNORMAL HIGH (ref 10–24)
BUN: 26 mg/dL (ref 8–27)
Bilirubin Total: 0.3 mg/dL (ref 0.0–1.2)
CO2: 21 mmol/L (ref 20–29)
Calcium: 9.4 mg/dL (ref 8.6–10.2)
Chloride: 101 mmol/L (ref 96–106)
Creatinine, Ser: 1.03 mg/dL (ref 0.76–1.27)
Globulin, Total: 2.6 g/dL (ref 1.5–4.5)
Glucose: 324 mg/dL — ABNORMAL HIGH (ref 70–99)
Potassium: 5 mmol/L (ref 3.5–5.2)
Sodium: 134 mmol/L (ref 134–144)
Total Protein: 6.8 g/dL (ref 6.0–8.5)
eGFR: 70 mL/min/{1.73_m2} (ref >59)

## 2022-08-20 LAB — CBC WITH DIFFERENTIAL/PLATELET
Basophils Absolute: 0 10*3/uL (ref 0.0–0.2)
Basos: 1 %
EOS (ABSOLUTE): 0.1 10*3/uL (ref 0.0–0.4)
Eos: 2 %
Hematocrit: 31.5 % — ABNORMAL LOW (ref 37.5–51.0)
Hemoglobin: 10 g/dL — ABNORMAL LOW (ref 13.0–17.7)
Immature Grans (Abs): 0 10*3/uL (ref 0.0–0.1)
Immature Granulocytes: 0 %
Lymphocytes Absolute: 1 10*3/uL (ref 0.7–3.1)
Lymphs: 28 %
MCH: 28.8 pg (ref 26.6–33.0)
MCHC: 31.7 g/dL (ref 31.5–35.7)
MCV: 91 fL (ref 79–97)
Monocytes Absolute: 0.3 10*3/uL (ref 0.1–0.9)
Monocytes: 9 %
Neutrophils Absolute: 2.3 10*3/uL (ref 1.4–7.0)
Neutrophils: 60 %
Platelets: 182 10*3/uL (ref 150–450)
RBC: 3.47 x10E6/uL — ABNORMAL LOW (ref 4.14–5.80)
RDW: 12.9 % (ref 11.6–15.4)
WBC: 3.7 10*3/uL (ref 3.4–10.8)

## 2022-08-20 LAB — BAYER DCA HB A1C WAIVED: HB A1C (BAYER DCA - WAIVED): 6.2 % — ABNORMAL HIGH (ref 4.8–5.6)

## 2022-09-01 ENCOUNTER — Ambulatory Visit (INDEPENDENT_AMBULATORY_CARE_PROVIDER_SITE_OTHER): Payer: Medicare HMO | Admitting: Family Medicine

## 2022-09-01 ENCOUNTER — Encounter: Payer: Self-pay | Admitting: Family Medicine

## 2022-09-01 VITALS — Temp 98.2°F | Ht 63.0 in | Wt 133.2 lb

## 2022-09-01 DIAGNOSIS — R011 Cardiac murmur, unspecified: Secondary | ICD-10-CM | POA: Diagnosis not present

## 2022-09-01 DIAGNOSIS — R42 Dizziness and giddiness: Secondary | ICD-10-CM | POA: Diagnosis not present

## 2022-09-01 NOTE — Progress Notes (Signed)
Subjective:  Patient ID: Roberto Miller, male    DOB: 1932-01-04  Age: 89 y.o. MRN: [id number]  CC: Dizziness (X2 days) and Fatigue   HPI Roberto Miller presents for dizziness. Occurs intermittently. Last was 2 days ago. Lasted several hours. Felt off balance. No fall or LOC. Occurs often standing up in church. Used to happen the day he took his ozempic shot, but has continued even though he Dced the med. There is no associated chest pain or dyspnea. No HA or visual change noted either.    Stopped taking the ozempic due to loss of appetite as well as the dizziness. He had a fall from the dizziness while taking the ozempic. None since it was Dced.      09/01/2022   10:07 AM 08/19/2022    3:22 PM 05/20/2022    3:10 PM  Depression screen PHQ 2/9  Decreased Interest 0 0 0  Down, Depressed, Hopeless  0 0  PHQ - 2 Score 0 0 0  Altered sleeping  0 0  Tired, decreased energy  0 0  Change in appetite  0 0  Feeling bad or failure about yourself   0 0  Trouble concentrating  0 0  Moving slowly or fidgety/restless  0 0  Suicidal thoughts  0 0  PHQ-9 Score  0 0  Difficult doing work/chores  Not difficult at all Not difficult at all    History Roberto Miller has a past medical history of Diabetes mellitus without complication (HCC), Hyperlipidemia, and Hypertension.   He has a past surgical history that includes Eye surgery (Right).   His family history includes Diabetes in his brother, sister, and sister.He reports that he has never smoked. He has never used smokeless tobacco. He reports that he does not drink alcohol and does not use drugs.    ROS Review of Systems  Constitutional:  Negative for fever.  Respiratory:  Negative for shortness of breath.   Cardiovascular:  Negative for chest pain.  Musculoskeletal:  Negative for arthralgias.  Skin:  Negative for rash.    Objective:  Temp 98.2 F (36.8 C) (Temporal)   Ht 5\' 3"  (1.6 m)   Wt 133 lb 3.2 oz (60.4 kg)   BMI 23.60 kg/m    BP Readings from Last 3 Encounters:  08/19/22 (!) 129/57  05/20/22 (!) 119/59  04/06/22 (!) 121/58    Wt Readings from Last 3 Encounters:  09/01/22 133 lb 3.2 oz (60.4 kg)  08/19/22 128 lb (58.1 kg)  05/20/22 128 lb (58.1 kg)   Orthostatic BP: Laying 125/68 P 58 Sitting 123/61 P 55 Standing 127/63 P 58  Physical Exam Vitals reviewed.  Constitutional:      Appearance: He is well-developed.  HENT:     Head: Normocephalic and atraumatic.     Right Ear: External ear normal.     Left Ear: External ear normal.     Mouth/Throat:     Pharynx: No oropharyngeal exudate or posterior oropharyngeal erythema.  Eyes:     Pupils: Pupils are equal, round, and reactive to light.  Cardiovascular:     Rate and Rhythm: Normal rate and regular rhythm.     Heart sounds: Murmur (3/6 holosystolic) heard.     No gallop.  Pulmonary:     Effort: No respiratory distress.     Breath sounds: Normal breath sounds.  Musculoskeletal:     Cervical back: Normal range of motion and neck supple.  Neurological:     Mental Status: He  is alert and oriented to person, place, and time.     EKG : Sinus brady, RBBB. No changes since 2016 tracing. NS ST changes anterolaterally are unchanged since 2016.  Assessment & Plan:   Roberto Miller was seen today for dizziness and fatigue.  Diagnoses and all orders for this visit:  Dizziness -     EKG 12-Lead -     CBC with Differential/Platelet -     CMP14+EGFR -     Ambulatory referral to Cardiology  Murmur, cardiac -     EKG 12-Lead -     CBC with Differential/Platelet -     CMP14+EGFR -     Ambulatory referral to Cardiology       I am having Roberto Miller maintain his diclofenac Sodium, Accu-Chek Guide, multivitamin, hydrocortisone, latanoprost, Accu-Chek Softclix Lancets, Accu-Chek Guide, lisinopril, Janumet, glipiZIDE, rosuvastatin, ferrous gluconate, Ozempic (0.25 or 0.5 MG/DOSE), and ferrous sulfate.  Allergies as of 09/01/2022   No Known  Allergies      Medication List        Accurate as of September 01, 2022  6:11 PM. If you have any questions, ask your nurse or doctor.          Accu-Chek Guide test strip Generic drug: glucose blood TEST BS 3 TIMES DAILY DX E11.9   Accu-Chek Guide w/Device Kit Check blood sugar 1x per day and prn   Dx E11.9   Accu-Chek Softclix Lancets lancets Test BS 3 TIMES DAILY Dx E11.9   diclofenac Sodium 1 % Gel Commonly known as: Voltaren Apply 2 g topically 4 (four) times daily.   ferrous gluconate 324 MG tablet Commonly known as: FERGON TAKE ONE (1) TABLET BY MOUTH EVERY DAY   ferrous sulfate 325 (65 FE) MG tablet Take by mouth.   glipiZIDE 10 MG tablet Commonly known as: GLUCOTROL Take 1 tablet (10 mg total) by mouth daily before breakfast.   hydrocortisone 1 % ointment Apply 1 application topically 2 (two) times daily.   Janumet 50-1000 MG tablet Generic drug: sitaGLIPtin-metformin Take 1 tablet by mouth 2 (two) times daily.   latanoprost 0.005 % ophthalmic solution Commonly known as: XALATAN Place 1 drop into both eyes at bedtime.   lisinopril 5 MG tablet Commonly known as: ZESTRIL Take 1 tablet (5 mg total) by mouth daily.   multivitamin capsule Take 1 capsule by mouth daily.   Ozempic (0.25 or 0.5 MG/DOSE) 2 MG/3ML Sopn Generic drug: Semaglutide(0.25 or 0.5MG /DOS) Inject 0.5 mg into the skin once a week.   rosuvastatin 5 MG tablet Commonly known as: CRESTOR Take 1 tablet (5 mg total) by mouth daily.         Follow-up: Return in about 1 month (around 10/01/2022) for with PCP.  Mechele Claude, M.D.

## 2022-09-02 LAB — CMP14+EGFR
ALT: 23 IU/L (ref 0–44)
AST: 33 IU/L (ref 0–40)
Albumin/Globulin Ratio: 1.6
Albumin: 4.4 g/dL (ref 3.7–4.7)
Alkaline Phosphatase: 115 IU/L (ref 44–121)
BUN/Creatinine Ratio: 26 — ABNORMAL HIGH (ref 10–24)
BUN: 25 mg/dL (ref 8–27)
Bilirubin Total: <0.2 mg/dL (ref 0.0–1.2)
CO2: 22 mmol/L (ref 20–29)
Calcium: 9.5 mg/dL (ref 8.6–10.2)
Chloride: 104 mmol/L (ref 96–106)
Creatinine, Ser: 0.98 mg/dL (ref 0.76–1.27)
Globulin, Total: 2.7 g/dL (ref 1.5–4.5)
Glucose: 173 mg/dL — ABNORMAL HIGH (ref 70–99)
Potassium: 5 mmol/L (ref 3.5–5.2)
Sodium: 137 mmol/L (ref 134–144)
Total Protein: 7.1 g/dL (ref 6.0–8.5)
eGFR: 74 mL/min/{1.73_m2} (ref >59)

## 2022-09-02 LAB — CBC WITH DIFFERENTIAL/PLATELET
Basophils Absolute: 0 10*3/uL (ref 0.0–0.2)
Basos: 1 %
EOS (ABSOLUTE): 0.1 10*3/uL (ref 0.0–0.4)
Eos: 2 %
Hematocrit: 31.4 % — ABNORMAL LOW (ref 37.5–51.0)
Hemoglobin: 10.4 g/dL — ABNORMAL LOW (ref 13.0–17.7)
Immature Grans (Abs): 0 10*3/uL (ref 0.0–0.1)
Immature Granulocytes: 0 %
Lymphocytes Absolute: 0.9 10*3/uL (ref 0.7–3.1)
Lymphs: 28 %
MCH: 29.7 pg (ref 26.6–33.0)
MCHC: 33.1 g/dL (ref 31.5–35.7)
MCV: 90 fL (ref 79–97)
Monocytes Absolute: 0.4 10*3/uL (ref 0.1–0.9)
Monocytes: 11 %
Neutrophils Absolute: 1.9 10*3/uL (ref 1.4–7.0)
Neutrophils: 58 %
Platelets: 164 10*3/uL (ref 150–450)
RBC: 3.5 x10E6/uL — ABNORMAL LOW (ref 4.14–5.80)
RDW: 13.1 % (ref 11.6–15.4)
WBC: 3.3 10*3/uL — ABNORMAL LOW (ref 3.4–10.8)

## 2022-09-03 NOTE — Progress Notes (Signed)
Hello [name],  Your lab result is normal and/or stable.Some minor variations that are not significant are commonly marked abnormal, but do not represent any medical problem for you.  Best regards, Mechele Claude, M.D.

## 2022-10-25 ENCOUNTER — Encounter: Payer: Self-pay | Admitting: *Deleted

## 2022-11-09 DIAGNOSIS — H40013 Open angle with borderline findings, low risk, bilateral: Secondary | ICD-10-CM | POA: Diagnosis not present

## 2022-11-09 DIAGNOSIS — H1013 Acute atopic conjunctivitis, bilateral: Secondary | ICD-10-CM | POA: Diagnosis not present

## 2022-11-10 ENCOUNTER — Other Ambulatory Visit: Payer: Self-pay | Admitting: Family Medicine

## 2022-11-10 DIAGNOSIS — E1169 Type 2 diabetes mellitus with other specified complication: Secondary | ICD-10-CM

## 2022-11-10 DIAGNOSIS — I152 Hypertension secondary to endocrine disorders: Secondary | ICD-10-CM

## 2022-11-19 ENCOUNTER — Ambulatory Visit: Payer: Medicare HMO | Admitting: Family Medicine

## 2022-12-01 ENCOUNTER — Ambulatory Visit (INDEPENDENT_AMBULATORY_CARE_PROVIDER_SITE_OTHER): Payer: Medicare HMO | Admitting: Family Medicine

## 2022-12-01 ENCOUNTER — Encounter: Payer: Self-pay | Admitting: Family Medicine

## 2022-12-01 VITALS — BP 103/57 | HR 67 | Ht 63.0 in | Wt 137.0 lb

## 2022-12-01 DIAGNOSIS — E1159 Type 2 diabetes mellitus with other circulatory complications: Secondary | ICD-10-CM | POA: Diagnosis not present

## 2022-12-01 DIAGNOSIS — Z23 Encounter for immunization: Secondary | ICD-10-CM

## 2022-12-01 DIAGNOSIS — D508 Other iron deficiency anemias: Secondary | ICD-10-CM

## 2022-12-01 DIAGNOSIS — Z7984 Long term (current) use of oral hypoglycemic drugs: Secondary | ICD-10-CM | POA: Diagnosis not present

## 2022-12-01 DIAGNOSIS — I152 Hypertension secondary to endocrine disorders: Secondary | ICD-10-CM | POA: Diagnosis not present

## 2022-12-01 DIAGNOSIS — E1169 Type 2 diabetes mellitus with other specified complication: Secondary | ICD-10-CM | POA: Diagnosis not present

## 2022-12-01 DIAGNOSIS — R011 Cardiac murmur, unspecified: Secondary | ICD-10-CM

## 2022-12-01 LAB — BAYER DCA HB A1C WAIVED: HB A1C (BAYER DCA - WAIVED): 7.1 % — ABNORMAL HIGH (ref 4.8–5.6)

## 2022-12-01 MED ORDER — LISINOPRIL 5 MG PO TABS
5.0000 mg | ORAL_TABLET | Freq: Every day | ORAL | 3 refills | Status: DC
Start: 2022-12-01 — End: 2023-11-07

## 2022-12-01 MED ORDER — JANUMET 50-1000 MG PO TABS
1.0000 | ORAL_TABLET | Freq: Two times a day (BID) | ORAL | 3 refills | Status: DC
Start: 2022-12-01 — End: 2023-11-07

## 2022-12-01 MED ORDER — ROSUVASTATIN CALCIUM 5 MG PO TABS
5.0000 mg | ORAL_TABLET | Freq: Every day | ORAL | 3 refills | Status: DC
Start: 2022-12-01 — End: 2023-11-07

## 2022-12-01 NOTE — Addendum Note (Signed)
Addended by: Arville Care on: 12/01/2022 04:42 PM   Modules accepted: Orders

## 2022-12-01 NOTE — Progress Notes (Signed)
BP (!) 103/57   Pulse 67   Ht 5\' 3"  (1.6 m)   Wt 137 lb (62.1 kg)   SpO2 97%   BMI 24.27 kg/m    Subjective:   Patient ID: Roberto Miller, male    DOB: 1932/02/20, 89 y.o.   MRN: [id number]  HPI: Roberto Miller is a 89 y.o. male presenting on 12/01/2022 for Medical Management of Chronic Issues and Diabetes   HPI Type 2 diabetes mellitus Patient comes in today for recheck of his diabetes. Patient has been currently taking glipizide and Janumet and Ozempic. Patient is currently on an ACE inhibitor/ARB. Patient has not seen an ophthalmologist this year. Patient denies any new issues with their feet. The symptom started onset as an adult hypertension ARE RELATED TO DM   Hypertension Patient is currently on lisinopril, and their blood pressure today is 103/57. Patient denies any lightheadedness or dizziness. Patient denies headaches, blurred vision, chest pains, shortness of breath, or weakness. Denies any side effects from medication and is content with current medication.   Iron deficiency anemia recheck. Patient is coming in today for iron deficiency anemia.  He is currently taking iron supplementation orally.  Relevant past medical, surgical, family and social history reviewed and updated as indicated. Interim medical history since our last visit reviewed. Allergies and medications reviewed and updated.  Review of Systems  Constitutional:  Negative for chills and fever.  Eyes:  Negative for visual disturbance.  Respiratory:  Negative for shortness of breath and wheezing.   Cardiovascular:  Negative for chest pain and leg swelling.  Musculoskeletal:  Negative for back pain and gait problem.  Skin:  Negative for rash.  Neurological:  Negative for dizziness, weakness and light-headedness.  All other systems reviewed and are negative.   Per HPI unless specifically indicated above   Allergies as of 12/01/2022   No Known Allergies      Medication List        Accurate  as of December 01, 2022  4:37 PM. If you have any questions, ask your nurse or doctor.          Accu-Chek Guide test strip Generic drug: glucose blood TEST BS 3 TIMES DAILY DX E11.9   Accu-Chek Guide w/Device Kit Check blood sugar 1x per day and prn   Dx E11.9   Accu-Chek Softclix Lancets lancets Test BS 3 TIMES DAILY Dx E11.9   diclofenac Sodium 1 % Gel Commonly known as: Voltaren Apply 2 g topically 4 (four) times daily.   ferrous gluconate 324 MG tablet Commonly known as: FERGON TAKE ONE (1) TABLET BY MOUTH EVERY DAY   ferrous sulfate 325 (65 FE) MG tablet Take by mouth.   glipiZIDE 10 MG tablet Commonly known as: GLUCOTROL Take 1 tablet (10 mg total) by mouth daily before breakfast.   hydrocortisone 1 % ointment Apply 1 application topically 2 (two) times daily.   Janumet 50-1000 MG tablet Generic drug: sitaGLIPtin-metformin Take 1 tablet by mouth 2 (two) times daily.   latanoprost 0.005 % ophthalmic solution Commonly known as: XALATAN Place 1 drop into both eyes at bedtime.   lisinopril 5 MG tablet Commonly known as: ZESTRIL Take 1 tablet (5 mg total) by mouth daily.   multivitamin capsule Take 1 capsule by mouth daily.   Ozempic (0.25 or 0.5 MG/DOSE) 2 MG/3ML Sopn Generic drug: Semaglutide(0.25 or 0.5MG /DOS) Inject 0.5 mg into the skin once a week.   rosuvastatin 5 MG tablet Commonly known as: CRESTOR Take 1 tablet (5  mg total) by mouth daily. What changed: how much to take Changed by: Elige Radon Marissa Weaver         Objective:   BP (!) 103/57   Pulse 67   Ht 5\' 3"  (1.6 m)   Wt 137 lb (62.1 kg)   SpO2 97%   BMI 24.27 kg/m   Wt Readings from Last 3 Encounters:  12/01/22 137 lb (62.1 kg)  09/01/22 133 lb 3.2 oz (60.4 kg)  08/19/22 128 lb (58.1 kg)    Physical Exam Vitals and nursing note reviewed.  Constitutional:      General: He is not in acute distress.    Appearance: He is well-developed. He is not diaphoretic.  Eyes:      General: No scleral icterus.    Conjunctiva/sclera: Conjunctivae normal.  Neck:     Thyroid: No thyromegaly.  Cardiovascular:     Rate and Rhythm: Normal rate and regular rhythm.     Heart sounds: Normal heart sounds. No murmur heard. Pulmonary:     Effort: Pulmonary effort is normal. No respiratory distress.     Breath sounds: Normal breath sounds. No wheezing.  Musculoskeletal:        General: No swelling. Normal range of motion.     Cervical back: Neck supple.  Lymphadenopathy:     Cervical: No cervical adenopathy.  Skin:    General: Skin is warm and dry.     Findings: No rash.  Neurological:     Mental Status: He is alert and oriented to person, place, and time.     Coordination: Coordination normal.  Psychiatric:        Behavior: Behavior normal.       Assessment & Plan:   Problem List Items Addressed This Visit       Cardiovascular and Mediastinum   Hypertension associated with diabetes (HCC)   Relevant Medications   JANUMET 50-1000 MG tablet   lisinopril (ZESTRIL) 5 MG tablet   rosuvastatin (CRESTOR) 5 MG tablet   Other Relevant Orders   CBC with Differential/Platelet   Bayer DCA Hb A1c Waived (Completed)   Lipid panel     Endocrine   Diabetes (HCC) - Primary   Relevant Medications   JANUMET 50-1000 MG tablet   lisinopril (ZESTRIL) 5 MG tablet   rosuvastatin (CRESTOR) 5 MG tablet   Other Relevant Orders   CBC with Differential/Platelet   Bayer DCA Hb A1c Waived (Completed)   Lipid panel     Other   Iron deficiency anemia   Other Visit Diagnoses     Encounter for immunization       Relevant Orders   Flu Vaccine Trivalent High Dose (Fluad) (Completed)     A1c 7.1, mainly focus on diet.  No changes today.  Seems to be doing well other than the blood sugar.  Follow up plan: Return in about 3 months (around 03/02/2023), or if symptoms worsen or fail to improve, for Diabetes recheck.  Counseling provided for all of the vaccine  components Orders Placed This Encounter  Procedures   Flu Vaccine Trivalent High Dose (Fluad)   CBC with Differential/Platelet   Bayer DCA Hb A1c Waived   Lipid panel    Arville Care, MD [facility] 12/01/2022, 4:37 PM

## 2022-12-02 ENCOUNTER — Ambulatory Visit: Payer: Medicare HMO | Admitting: Family Medicine

## 2022-12-02 LAB — LIPID PANEL
Chol/HDL Ratio: 1.9 ratio (ref 0.0–5.0)
Cholesterol, Total: 142 mg/dL (ref 100–199)
HDL: 73 mg/dL (ref >39)
LDL Chol Calc (NIH): 41 mg/dL (ref 0–99)
Triglycerides: 177 mg/dL — ABNORMAL HIGH (ref 0–149)
VLDL Cholesterol Cal: 28 mg/dL (ref 5–40)

## 2022-12-02 LAB — CBC WITH DIFFERENTIAL/PLATELET
Basophils Absolute: 0 10*3/uL (ref 0.0–0.2)
Basos: 1 %
EOS (ABSOLUTE): 0.1 10*3/uL (ref 0.0–0.4)
Eos: 3 %
Hematocrit: 30.9 % — ABNORMAL LOW (ref 37.5–51.0)
Hemoglobin: 10 g/dL — ABNORMAL LOW (ref 13.0–17.7)
Immature Grans (Abs): 0 10*3/uL (ref 0.0–0.1)
Immature Granulocytes: 0 %
Lymphocytes Absolute: 0.8 10*3/uL (ref 0.7–3.1)
Lymphs: 23 %
MCH: 29.5 pg (ref 26.6–33.0)
MCHC: 32.4 g/dL (ref 31.5–35.7)
MCV: 91 fL (ref 79–97)
Monocytes Absolute: 0.4 10*3/uL (ref 0.1–0.9)
Monocytes: 11 %
Neutrophils Absolute: 2.1 10*3/uL (ref 1.4–7.0)
Neutrophils: 62 %
Platelets: 169 10*3/uL (ref 150–450)
RBC: 3.39 x10E6/uL — ABNORMAL LOW (ref 4.14–5.80)
RDW: 13.2 % (ref 11.6–15.4)
WBC: 3.5 10*3/uL (ref 3.4–10.8)

## 2023-01-10 ENCOUNTER — Ambulatory Visit (HOSPITAL_COMMUNITY)
Admission: RE | Admit: 2023-01-10 | Discharge: 2023-01-10 | Disposition: A | Discharge status: Home / Self Care | Payer: Medicare HMO | Source: Ambulatory Visit | Attending: Family Medicine | Admitting: Family Medicine

## 2023-01-10 DIAGNOSIS — R011 Cardiac murmur, unspecified: Secondary | ICD-10-CM | POA: Diagnosis not present

## 2023-01-10 LAB — ECHOCARDIOGRAM COMPLETE
AR max vel: 2.77 cm2
AV Area VTI: 2.74 cm2
AV Area mean vel: 2.74 cm2
AV Mean grad: 4 mm[Hg]
AV Peak grad: 8.4 mm[Hg]
Ao pk vel: 1.45 m/s
Area-P 1/2: 2.73 cm2
Calc EF: 65.7 %
P 1/2 time: 828 ms
S' Lateral: 2 cm
Single Plane A2C EF: 68.3 %
Single Plane A4C EF: 63.3 %

## 2023-01-10 NOTE — Progress Notes (Signed)
  Echocardiogram 2D Echocardiogram has been performed.  Roberto Miller 01/10/2023, 12:37 PM

## 2023-01-13 ENCOUNTER — Ambulatory Visit: Payer: Medicare HMO

## 2023-01-13 VITALS — Ht 63.0 in | Wt 137.0 lb

## 2023-01-13 DIAGNOSIS — Z0000 Encounter for general adult medical examination without abnormal findings: Secondary | ICD-10-CM

## 2023-01-13 NOTE — Patient Instructions (Addendum)
Roberto Miller , Thank you for taking time to come for your Medicare Wellness Visit. I appreciate your ongoing commitment to your health goals. Please review the following plan we discussed and let me know if I can assist you in the future.   Referrals/Orders/Follow-Ups/Clinician Recommendations:   This is a list of the screening recommended for you and due dates:  Health Maintenance  Topic Date Due   Eye exam for diabetics  02/19/2022   COVID-19 Vaccine (3 - 2023-24 season) 11/21/2022   Hemoglobin A1C  05/31/2023   Complete foot exam   08/19/2023   Medicare Annual Wellness Visit  01/13/2024   DTaP/Tdap/Td vaccine (3 - Td or Tdap) 12/18/2030   Pneumonia Vaccine  Completed   Flu Shot  Completed   Zoster (Shingles) Vaccine  Completed   HPV Vaccine  Aged Out    Advanced directives: (Copy Requested) Please bring a copy of your health care power of attorney and living will to the office to be added to your chart at your convenience.  Next Medicare Annual Wellness Visit scheduled for next year: Yes

## 2023-01-13 NOTE — Progress Notes (Signed)
Subjective:   Roberto Miller is a 89 y.o. male who presents for Medicare Annual/Subsequent preventive examination.  Visit Complete: Virtual I connected with  [name] on 01/13/23 by a audio enabled telemedicine application and verified that I am speaking with the correct person using two identifiers.  Patient Location: Home  Provider Location: Home Office  I discussed the limitations of evaluation and management by telemedicine. The patient expressed understanding and agreed to proceed.  Vital Signs: Because this visit was a virtual/telehealth visit, some criteria may be missing or patient reported. Any vitals not documented were not able to be obtained and vitals that have been documented are patient reported.   Cardiac Risk Factors include: advanced age (>62men, >64 women);male gender;diabetes mellitus;hypertension     Objective:    Today's Vitals   01/13/23 1144  Weight: 137 lb (62.1 kg)  Height: 5\' 3"  (1.6 m)   Body mass index is 24.27 kg/m.     01/13/2023   12:01 PM 09/10/2021   10:50 AM 09/09/2020    2:37 PM 06/10/2020    8:44 PM 09/06/2019    2:11 PM 08/29/2018    2:43 PM 04/01/2014    3:33 PM  Advanced Directives  Does Patient Have a Medical Advance Directive? Yes No No No No Yes No  Type of Estate agent of [place];Living will     Living will;Healthcare Power of Attorney   Does patient want to make changes to medical advance directive?      No - Patient declined   Copy of Healthcare Power of Attorney in Chart? No - copy requested     No - copy requested   Would patient like information on creating a medical advance directive?  Yes (MAU/Ambulatory/Procedural Areas - Information given) No - Patient declined  No - Patient declined  Yes - Educational materials given    Current Medications (verified) Outpatient Encounter Medications as of 01/13/2023  Medication Sig   Accu-Chek Softclix Lancets lancets Test BS 3 TIMES DAILY Dx E11.9    Blood Glucose Monitoring Suppl (ACCU-CHEK GUIDE) w/Device KIT Check blood sugar 1x per day and prn   Dx E11.9   diclofenac Sodium (VOLTAREN) 1 % GEL Apply 2 g topically 4 (four) times daily.   ferrous gluconate (FERGON) 324 MG tablet TAKE ONE (1) TABLET BY MOUTH EVERY DAY   ferrous sulfate 325 (65 FE) MG tablet Take by mouth.   glipiZIDE (GLUCOTROL) 10 MG tablet Take 1 tablet (10 mg total) by mouth daily before breakfast.   glucose blood (ACCU-CHEK GUIDE) test strip TEST BS 3 TIMES DAILY DX E11.9   hydrocortisone 1 % ointment Apply 1 application topically 2 (two) times daily.   JANUMET 50-1000 MG tablet Take 1 tablet by mouth 2 (two) times daily.   latanoprost (XALATAN) 0.005 % ophthalmic solution Place 1 drop into both eyes at bedtime.   lisinopril (ZESTRIL) 5 MG tablet Take 1 tablet (5 mg total) by mouth daily.   Multiple Vitamin (MULTIVITAMIN) capsule Take 1 capsule by mouth daily.   rosuvastatin (CRESTOR) 5 MG tablet Take 1 tablet (5 mg total) by mouth daily.   Semaglutide,0.25 or 0.5MG /DOS, (OZEMPIC, 0.25 OR 0.5 MG/DOSE,) 2 MG/3ML SOPN Inject 0.5 mg into the skin once a week.   No facility-administered encounter medications on file as of 01/13/2023.    Allergies (verified) Patient has no known allergies.   History: Past Medical History:  Diagnosis Date   Diabetes mellitus without complication (HCC)    Hyperlipidemia  Hypertension    Past Surgical History:  Procedure Laterality Date   EYE SURGERY Right    Family History  Problem Relation Age of Onset   Diabetes Sister    Diabetes Brother    Diabetes Sister    Social History   Socioeconomic History   Marital status: Married    Spouse name: Davonna Belling   Number of children: 9   Years of education: 1   Highest education level: 1st grade  Occupational History   Occupation: retired  Tobacco Use   Smoking status: Never   Smokeless tobacco: Never  Vaping Use   Vaping status: Never Used  Substance and Sexual Activity    Alcohol use: No   Drug use: No   Sexual activity: Not Currently  Other Topics Concern   Not on file  Social History Narrative   Living with daughter in [place] - speaks no English   Social Determinants of Health   Financial Resource Strain: Low Risk  (01/13/2023)   Overall Financial Resource Strain (CARDIA)    Difficulty of Paying Living Expenses: Not hard at all  Food Insecurity: No Food Insecurity (01/13/2023)   Hunger Vital Sign    Worried About Running Out of Food in the Last Year: Never true    Ran Out of Food in the Last Year: Never true  Transportation Needs: No Transportation Needs (01/13/2023)   PRAPARE - Administrator, Civil Service (Medical): No    Lack of Transportation (Non-Medical): No  Physical Activity: Sufficiently Active (01/13/2023)   Exercise Vital Sign    Days of Exercise per Week: 7 days    Minutes of Exercise per Session: 30 min  Stress: No Stress Concern Present (01/13/2023)   [employer] of Occupational Health - Occupational Stress Questionnaire    Feeling of Stress : Not at all  Social Connections: Moderately Integrated (01/13/2023)   Social Connection and Isolation Panel [NHANES]    Frequency of Communication with Friends and Family: More than three times a week    Frequency of Social Gatherings with Friends and Family: More than three times a week    Attends Religious Services: More than 4 times per year    Active Member of [employer] or Organizations: Yes    Attends Banker Meetings: More than 4 times per year    Marital Status: Widowed    Tobacco Counseling Counseling given: Not Answered   Clinical Intake:  Pre-visit preparation completed: Yes  Pain : No/denies pain     BMI - recorded: 24.27 Nutritional Status: BMI of 19-24  Normal Nutritional Risks: None Diabetes: Yes CBG done?: Yes (CBG 125 Per patient) CBG resulted in Enter/ Edit results?: Yes Did pt. bring in CBG monitor from home?: No  How often do  you need to have someone help you when you read instructions, pamphlets, or other written materials from your doctor or pharmacy?: 1 - Never  Interpreter Needed?: No  Information entered by :: Theresa Mulligan LPN   Activities of Daily Living    01/13/2023   12:00 PM  In your present state of health, do you have any difficulty performing the following activities:  Hearing? 0  Vision? 0  Difficulty concentrating or making decisions? 0  Walking or climbing stairs? 0  Dressing or bathing? 0  Doing errands, shopping? 0  Preparing Food and eating ? N  Using the Toilet? N  In the past six months, have you accidently leaked urine? N  Do you have problems  with loss of bowel control? N  Managing your Medications? N  Managing your Finances? N  Housekeeping or managing your Housekeeping? N    Patient Care Team: [name], Elige Radon, MD as PCP - General (Family Medicine) Danella Maiers, [facility] as Pharmacist (Family Medicine) Dione Booze, Bertram Millard, MD as Consulting Physician (Ophthalmology)  Indicate any recent Medical Services you may have received from other than [facility] providers in the past year (date may be approximate).     Assessment:   This is a routine wellness examination for [name].  Hearing/Vision screen Hearing Screening - Comments:: Denies hearing difficulties   Vision Screening - Comments:: Wears rx glasses - up to date with routine eye exams with  Deferred   Goals Addressed               This Visit's Progress     Stay Active (pt-stated)         Depression Screen    01/13/2023   11:52 AM 12/01/2022    3:57 PM 09/01/2022   10:07 AM 08/19/2022    3:22 PM 05/20/2022    3:10 PM 03/29/2022    2:04 PM 02/18/2022    2:59 PM  PHQ 2/9 Scores  PHQ - 2 Score 0 0 0 0 0 0 0  PHQ- 9 Score 0 0  0 0 0     Fall Risk    01/13/2023   12:00 PM 12/01/2022    3:57 PM 09/01/2022   10:06 AM 08/19/2022    3:21 PM 05/20/2022    3:10 PM  Fall Risk   Falls in the past year? 0 0 1 1  0  Number falls in past yr: 0  0 0   Injury with Fall? 0  1 1   Risk for fall due to : No Fall Risks  Impaired balance/gait Impaired balance/gait   Follow up Falls prevention discussed  Falls evaluation completed;Education provided Falls evaluation completed     MEDICARE RISK AT HOME: Medicare Risk at Home Any stairs in or around the home?: No If so, are there any without handrails?: No Home free of loose throw rugs in walkways, pet beds, electrical cords, etc?: Yes Adequate lighting in your home to reduce risk of falls?: Yes Life alert?: No Use of a cane, walker or w/c?: No Grab bars in the bathroom?: Yes Shower chair or bench in shower?: Yes Elevated toilet seat or a handicapped toilet?: Yes  TIMED UP AND GO:  Was the test performed?  No    Cognitive Function:    05/11/2017   12:00 PM  MMSE - Mini Mental State Exam  Not completed: Unable to complete        01/13/2023   12:01 PM 09/10/2021   10:53 AM 09/06/2019    2:15 PM 08/29/2018    2:44 PM  6CIT Screen  What Year? 0 points 0 points 4 points 0 points  What month? 0 points 0 points 0 points 0 points  What time? 0 points 0 points 0 points 0 points  Count back from 20 0 points 2 points 0 points 0 points  Months in reverse 0 points 4 points 4 points 0 points  Repeat phrase 4 points 4 points 10 points 0 points  Total Score 4 points 10 points 18 points 0 points    Immunizations Immunization History  Administered Date(s) Administered   Fluad Quad(high Dose 65+) 01/10/2019, 02/20/2020, 12/17/2020, 02/18/2022   Fluad Trivalent(High Dose 65+) 12/01/2022   Influenza, High Dose  Seasonal PF 03/06/2018   Influenza,inj,Quad PF,6+ Mos 12/15/2012, 12/27/2013, 01/22/2016   Moderna Sars-Cov-2 Peds vaccine 3yrs thru 21yrs 11/12/2020   Moderna Sars-Covid-2 Vaccination 01/23/2020   Pneumococcal Conjugate-13 07/04/2014   Pneumococcal Polysaccharide-23 11/19/2010   Tdap 09/19/2010, 12/17/2020   Zoster Recombinant(Shingrix)  06/02/2020, 09/15/2020    TDAP status: Up to date  Flu Vaccine status: Up to date  Pneumococcal vaccine status: Up to date  Covid-19 vaccine status: Declined, Education has been provided regarding the importance of this vaccine but patient still declined. Advised may receive this vaccine at local pharmacy or Health Dept.or vaccine clinic. Aware to provide a copy of the vaccination record if obtained from local pharmacy or Health Dept. Verbalized acceptance and understanding.  Qualifies for Shingles Vaccine? Yes   Zostavax completed Yes   Shingrix Completed?: Yes  Screening Tests Health Maintenance  Topic Date Due   OPHTHALMOLOGY EXAM  02/19/2022   COVID-19 Vaccine (3 - 2023-24 season) 11/21/2022   HEMOGLOBIN A1C  05/31/2023   FOOT EXAM  08/19/2023   Medicare Annual Wellness (AWV)  01/13/2024   DTaP/Tdap/Td (3 - Td or Tdap) 12/18/2030   Pneumonia Vaccine 14+ Years old  Completed   INFLUENZA VACCINE  Completed   Zoster Vaccines- Shingrix  Completed   HPV VACCINES  Aged Out    Health Maintenance  Health Maintenance Due  Topic Date Due   OPHTHALMOLOGY EXAM  02/19/2022   COVID-19 Vaccine (3 - 2023-24 season) 11/21/2022        Additional Screening:    Vision Screening: Recommended annual ophthalmology exams for early detection of glaucoma and other disorders of the eye. Is the patient up to date with their annual eye exam?  Yes  Who is the provider or what is the name of the office in which the patient attends annual eye exams? Deferred If pt is not established with a provider, would they like to be referred to a provider to establish care? No .   Dental Screening: Recommended annual dental exams for proper oral hygiene  Diabetic Foot Exam: Diabetic Foot Exam: Completed 08/19/22  Community Resource Referral / Chronic Care Management:  CRR required this visit?  No   CCM required this visit?  No     Plan:     I have personally reviewed and noted the following  in the patient's chart:   Medical and social history Use of alcohol, tobacco or illicit drugs  Current medications and supplements including opioid prescriptions. Patient is not currently taking opioid prescriptions. Functional ability and status Nutritional status Physical activity Advanced directives List of other physicians Hospitalizations, surgeries, and ER visits in previous 12 months Vitals Screenings to include cognitive, depression, and falls Referrals and appointments  In addition, I have reviewed and discussed with patient certain preventive protocols, quality metrics, and best practice recommendations. A written personalized care plan for preventive services as well as general preventive health recommendations were provided to patient.     Tillie Rung, LPN   16/12/9602   After Visit Summary: (MyChart) Due to this being a telephonic visit, the after visit summary with patients personalized plan was offered to patient via MyChart   Nurse Notes: None

## 2023-01-20 ENCOUNTER — Other Ambulatory Visit: Payer: Self-pay | Admitting: Family Medicine

## 2023-01-20 DIAGNOSIS — E1169 Type 2 diabetes mellitus with other specified complication: Secondary | ICD-10-CM

## 2023-02-09 ENCOUNTER — Other Ambulatory Visit: Payer: Self-pay | Admitting: Family Medicine

## 2023-03-03 ENCOUNTER — Ambulatory Visit: Payer: Medicare HMO | Admitting: Family Medicine

## 2023-03-03 ENCOUNTER — Encounter: Payer: Self-pay | Admitting: Family Medicine

## 2023-03-03 VITALS — BP 131/64 | HR 64 | Ht 63.0 in | Wt 133.0 lb

## 2023-03-03 DIAGNOSIS — E1159 Type 2 diabetes mellitus with other circulatory complications: Secondary | ICD-10-CM

## 2023-03-03 DIAGNOSIS — E1169 Type 2 diabetes mellitus with other specified complication: Secondary | ICD-10-CM

## 2023-03-03 DIAGNOSIS — D508 Other iron deficiency anemias: Secondary | ICD-10-CM

## 2023-03-03 DIAGNOSIS — I152 Hypertension secondary to endocrine disorders: Secondary | ICD-10-CM

## 2023-03-03 LAB — BAYER DCA HB A1C WAIVED: HB A1C (BAYER DCA - WAIVED): 6.7 % — ABNORMAL HIGH (ref 4.8–5.6)

## 2023-03-03 MED ORDER — GLIPIZIDE 10 MG PO TABS: 10.0000 mg | ORAL_TABLET | Freq: Every day | ORAL | 3 refills | Status: DC

## 2023-03-03 NOTE — Progress Notes (Signed)
BP 131/64   Pulse 64   Ht 5\' 3"  (1.6 m)   Wt 133 lb (60.3 kg)   SpO2 98%   BMI 23.56 kg/m    Subjective:   Patient ID: Roberto Miller, male    DOB: [birth year], 89 y.o.   MRN: [id number]  HPI: Roberto Miller is a 89 y.o. male presenting on 03/03/2023 for Medical Management of Chronic Issues and Diabetes   HPI Type 2 diabetes mellitus Patient comes in today for recheck of his diabetes. Patient has been currently taking glipizide and janumet. Patient is currently on an ACE inhibitor/ARB. Patient has seen an ophthalmologist this year. Patient denies any new issues with their feet. The symptom started onset as an adult htn ARE RELATED TO DM   Hypertension Patient is currently on lisinopril, and their blood pressure today is 131/64. Patient denies any lightheadedness or dizziness. Patient denies headaches, blurred vision, chest pains, shortness of breath, or weakness. Denies any side effects from medication and is content with current medication.   Iron deficiency anemia Patient is coming in for recheck for iron deficiency anemia.  He does still take his iron.  Relevant past medical, surgical, family and social history reviewed and updated as indicated. Interim medical history since our last visit reviewed. Allergies and medications reviewed and updated.  Review of Systems  Constitutional:  Negative for chills and fever.  Eyes:  Negative for visual disturbance.  Respiratory:  Negative for shortness of breath and wheezing.   Cardiovascular:  Negative for chest pain and leg swelling.  Musculoskeletal:  Negative for back pain and gait problem.  Skin:  Negative for rash.  All other systems reviewed and are negative.   Per HPI unless specifically indicated above   Allergies as of 03/03/2023   No Known Allergies      Medication List        Accurate as of March 03, 2023  3:52 PM. If you have any questions, ask your nurse or doctor.          STOP taking these  medications    ferrous sulfate 325 (65 FE) MG tablet Stopped by: Elige Radon Tu Shimmel   Ozempic (0.25 or 0.5 MG/DOSE) 2 MG/3ML Sopn Generic drug: Semaglutide(0.25 or 0.5MG /DOS) Stopped by: Elige Radon Ashanna Heinsohn       TAKE these medications    Accu-Chek Guide w/Device Kit Check blood sugar 1x per day and prn   Dx E11.9   Accu-Chek Softclix Lancets lancets Test BS 3 TIMES DAILY Dx E11.9   diclofenac Sodium 1 % Gel Commonly known as: Voltaren Apply 2 g topically 4 (four) times daily.   ferrous gluconate 324 MG tablet Commonly known as: FERGON TAKE ONE (1) TABLET BY MOUTH EVERY DAY   glipiZIDE 10 MG tablet Commonly known as: GLUCOTROL Take 1 tablet (10 mg total) by mouth daily before breakfast. What changed: See the new instructions. Changed by: Elige Radon Lauris Serviss   hydrocortisone 1 % ointment Apply 1 application topically 2 (two) times daily.   Janumet 50-1000 MG tablet Generic drug: sitaGLIPtin-metformin Take 1 tablet by mouth 2 (two) times daily.   latanoprost 0.005 % ophthalmic solution Commonly known as: XALATAN Place 1 drop into both eyes at bedtime.   lisinopril 5 MG tablet Commonly known as: ZESTRIL Take 1 tablet (5 mg total) by mouth daily.   multivitamin capsule Take 1 capsule by mouth daily.   OneTouch Verio test strip Generic drug: glucose blood Test BS 3 TIMES DAILY Dx E11.9  rosuvastatin 5 MG tablet Commonly known as: CRESTOR Take 1 tablet (5 mg total) by mouth daily.         Objective:   BP 131/64   Pulse 64   Ht 5\' 3"  (1.6 m)   Wt 133 lb (60.3 kg)   SpO2 98%   BMI 23.56 kg/m   Wt Readings from Last 3 Encounters:  03/03/23 133 lb (60.3 kg)  01/13/23 137 lb (62.1 kg)  12/01/22 137 lb (62.1 kg)    Physical Exam Vitals and nursing note reviewed.  Constitutional:      General: He is not in acute distress.    Appearance: He is well-developed. He is not diaphoretic.  Eyes:     General: No scleral icterus.    Conjunctiva/sclera:  Conjunctivae normal.  Neck:     Thyroid: No thyromegaly.  Cardiovascular:     Rate and Rhythm: Normal rate and regular rhythm.     Heart sounds: Normal heart sounds. No murmur heard. Pulmonary:     Effort: Pulmonary effort is normal. No respiratory distress.     Breath sounds: Normal breath sounds. No wheezing.  Musculoskeletal:        General: No swelling. Normal range of motion.     Cervical back: Neck supple.  Lymphadenopathy:     Cervical: No cervical adenopathy.  Skin:    General: Skin is warm and dry.     Findings: No rash.  Neurological:     Mental Status: He is alert and oriented to person, place, and time.     Coordination: Coordination normal.  Psychiatric:        Behavior: Behavior normal.       Assessment & Plan:   Problem List Items Addressed This Visit       Cardiovascular and Mediastinum   Hypertension associated with diabetes (HCC)   Relevant Medications   glipiZIDE (GLUCOTROL) 10 MG tablet     Endocrine   Diabetes (HCC) - Primary   Relevant Medications   glipiZIDE (GLUCOTROL) 10 MG tablet   Other Relevant Orders   Bayer DCA Hb A1c Waived     Other   Iron deficiency anemia    A1c looks good at 6.7.  Blood pressure and everything else looks good.  No changes. Follow up plan: Return if symptoms worsen or fail to improve.  Counseling provided for all of the vaccine components Orders Placed This Encounter  Procedures   Bayer DCA Hb A1c Waived    Arville Care, MD [facility] 03/03/2023, 3:52 PM

## 2023-03-03 NOTE — Addendum Note (Signed)
Addended by: Arville Care on: 03/03/2023 03:55 PM   Modules accepted: Orders

## 2023-03-04 LAB — CBC WITH DIFFERENTIAL/PLATELET
Basophils Absolute: 0 10*3/uL (ref 0.0–0.2)
Basos: 1 %
EOS (ABSOLUTE): 0.1 10*3/uL (ref 0.0–0.4)
Eos: 3 %
Hematocrit: 32.6 % — ABNORMAL LOW (ref 37.5–51.0)
Hemoglobin: 10.5 g/dL — ABNORMAL LOW (ref 13.0–17.7)
Immature Grans (Abs): 0 10*3/uL (ref 0.0–0.1)
Immature Granulocytes: 0 %
Lymphocytes Absolute: 1 10*3/uL (ref 0.7–3.1)
Lymphs: 23 %
MCH: 29.6 pg (ref 26.6–33.0)
MCHC: 32.2 g/dL (ref 31.5–35.7)
MCV: 92 fL (ref 79–97)
Monocytes Absolute: 0.4 10*3/uL (ref 0.1–0.9)
Monocytes: 9 %
Neutrophils Absolute: 2.7 10*3/uL (ref 1.4–7.0)
Neutrophils: 64 %
Platelets: 179 10*3/uL (ref 150–450)
RBC: 3.55 x10E6/uL — ABNORMAL LOW (ref 4.14–5.80)
RDW: 12.7 % (ref 11.6–15.4)
WBC: 4.2 10*3/uL (ref 3.4–10.8)

## 2023-03-04 LAB — CMP14+EGFR
ALT: 14 [IU]/L (ref 0–44)
AST: 25 [IU]/L (ref 0–40)
Albumin: 4.2 g/dL (ref 3.7–4.7)
Alkaline Phosphatase: 96 [IU]/L (ref 44–121)
BUN/Creatinine Ratio: 23 (ref 10–24)
BUN: 26 mg/dL (ref 8–27)
Bilirubin Total: 0.2 mg/dL (ref 0.0–1.2)
CO2: 25 mmol/L (ref 20–29)
Calcium: 9.9 mg/dL (ref 8.6–10.2)
Chloride: 104 mmol/L (ref 96–106)
Creatinine, Ser: 1.14 mg/dL (ref 0.76–1.27)
Globulin, Total: 2.8 g/dL (ref 1.5–4.5)
Glucose: 228 mg/dL — ABNORMAL HIGH (ref 70–99)
Potassium: 5.2 mmol/L (ref 3.5–5.2)
Sodium: 141 mmol/L (ref 134–144)
Total Protein: 7 g/dL (ref 6.0–8.5)
eGFR: 61 mL/min/{1.73_m2} (ref >59)

## 2023-03-14 DIAGNOSIS — E119 Type 2 diabetes mellitus without complications: Secondary | ICD-10-CM | POA: Diagnosis not present

## 2023-03-14 DIAGNOSIS — H1013 Acute atopic conjunctivitis, bilateral: Secondary | ICD-10-CM | POA: Diagnosis not present

## 2023-03-14 DIAGNOSIS — H40013 Open angle with borderline findings, low risk, bilateral: Secondary | ICD-10-CM | POA: Diagnosis not present

## 2023-03-14 DIAGNOSIS — Z961 Presence of intraocular lens: Secondary | ICD-10-CM | POA: Diagnosis not present

## 2023-06-03 ENCOUNTER — Ambulatory Visit: Payer: 59 | Admitting: Family Medicine

## 2023-06-17 ENCOUNTER — Ambulatory Visit: Admitting: Family Medicine

## 2023-06-17 ENCOUNTER — Encounter: Payer: Self-pay | Admitting: Family Medicine

## 2023-06-17 VITALS — BP 130/66 | HR 61 | Ht 63.0 in | Wt 134.0 lb

## 2023-06-17 DIAGNOSIS — I152 Hypertension secondary to endocrine disorders: Secondary | ICD-10-CM

## 2023-06-17 DIAGNOSIS — D508 Other iron deficiency anemias: Secondary | ICD-10-CM

## 2023-06-17 DIAGNOSIS — E1169 Type 2 diabetes mellitus with other specified complication: Secondary | ICD-10-CM

## 2023-06-17 DIAGNOSIS — E1159 Type 2 diabetes mellitus with other circulatory complications: Secondary | ICD-10-CM

## 2023-06-17 DIAGNOSIS — Z7984 Long term (current) use of oral hypoglycemic drugs: Secondary | ICD-10-CM

## 2023-06-17 LAB — BAYER DCA HB A1C WAIVED: HB A1C (BAYER DCA - WAIVED): 6.9 % — ABNORMAL HIGH (ref 4.8–5.6)

## 2023-06-17 MED ORDER — FERROUS GLUCONATE 324 (38 FE) MG PO TABS: ORAL_TABLET | ORAL | 3 refills | Status: AC

## 2023-06-17 NOTE — Progress Notes (Signed)
 BP 130/66   Pulse 61   Ht 5\' 3"  (1.6 m)   Wt 134 lb (60.8 kg)   SpO2 98%   BMI 23.74 kg/m    Subjective:   Patient ID: Roberto Miller, male    DOB: 20-May-1931, 89 y.o.   MRN: [id number]  HPI: Roberto Miller is a 89 y.o. male presenting on 06/17/2023 for Medical Management of Chronic Issues and Diabetes   HPI Type 2 diabetes mellitus Patient comes in today for recheck of his diabetes. Patient has been currently taking Janumet and glipizide. Patient is currently on an ACE inhibitor/ARB. Patient has seen an ophthalmologist this year. Patient denies any new issues with their feet. The symptom started onset as an adult hypertension ARE RELATED TO DM   Hypertension Patient is currently on lisinopril, and their blood pressure today is 130/66. Patient denies any lightheadedness or dizziness. Patient denies headaches, blurred vision, chest pains, shortness of breath, or weakness. Denies any side effects from medication and is content with current medication.   Anemia recheck Patient has iron deficiency anemia and is coming in for recheck today.  He still takes iron.  Relevant past medical, surgical, family and social history reviewed and updated as indicated. Interim medical history since our last visit reviewed. Allergies and medications reviewed and updated.  Review of Systems  Constitutional:  Negative for chills and fever.  Eyes:  Negative for visual disturbance.  Respiratory:  Negative for shortness of breath and wheezing.   Cardiovascular:  Negative for chest pain and leg swelling.  Musculoskeletal:  Negative for back pain and gait problem.  Skin:  Negative for rash.  Neurological:  Negative for dizziness, weakness and light-headedness.  All other systems reviewed and are negative.   Per HPI unless specifically indicated above   Allergies as of 06/17/2023   No Known Allergies      Medication List        Accurate as of June 17, 2023  1:12 PM. If you have any  questions, ask your nurse or doctor.          Accu-Chek Guide w/Device Kit Check blood sugar 1x per day and prn   Dx E11.9   Accu-Chek Softclix Lancets lancets Test BS 3 TIMES DAILY Dx E11.9   diclofenac Sodium 1 % Gel Commonly known as: Voltaren Apply 2 g topically 4 (four) times daily.   ferrous gluconate 324 MG tablet Commonly known as: FERGON TAKE ONE (1) TABLET BY MOUTH EVERY DAY   glipiZIDE 10 MG tablet Commonly known as: GLUCOTROL Take 1 tablet (10 mg total) by mouth daily before breakfast.   hydrocortisone 1 % ointment Apply 1 application topically 2 (two) times daily.   Janumet 50-1000 MG tablet Generic drug: sitaGLIPtin-metformin Take 1 tablet by mouth 2 (two) times daily.   latanoprost 0.005 % ophthalmic solution Commonly known as: XALATAN Place 1 drop into both eyes at bedtime.   lisinopril 5 MG tablet Commonly known as: ZESTRIL Take 1 tablet (5 mg total) by mouth daily.   multivitamin capsule Take 1 capsule by mouth daily.   OneTouch Verio test strip Generic drug: glucose blood Test BS 3 TIMES DAILY Dx E11.9   rosuvastatin 5 MG tablet Commonly known as: CRESTOR Take 1 tablet (5 mg total) by mouth daily.         Objective:   BP 130/66   Pulse 61   Ht 5\' 3"  (1.6 m)   Wt 134 lb (60.8 kg)   SpO2 98%  BMI 23.74 kg/m   Wt Readings from Last 3 Encounters:  06/17/23 134 lb (60.8 kg)  03/03/23 133 lb (60.3 kg)  01/13/23 137 lb (62.1 kg)    Physical Exam Vitals and nursing note reviewed.  Constitutional:      General: He is not in acute distress.    Appearance: He is well-developed. He is not diaphoretic.  Eyes:     General: No scleral icterus.    Conjunctiva/sclera: Conjunctivae normal.  Neck:     Thyroid: No thyromegaly.  Cardiovascular:     Rate and Rhythm: Normal rate and regular rhythm.     Heart sounds: Normal heart sounds. No murmur heard. Pulmonary:     Effort: Pulmonary effort is normal. No respiratory distress.      Breath sounds: Normal breath sounds. No wheezing.  Musculoskeletal:        General: No swelling. Normal range of motion.     Cervical back: Neck supple.  Lymphadenopathy:     Cervical: No cervical adenopathy.  Skin:    General: Skin is warm and dry.     Findings: No rash.  Neurological:     Mental Status: He is alert and oriented to person, place, and time.     Coordination: Coordination normal.  Psychiatric:        Behavior: Behavior normal.       Assessment & Plan:   Problem List Items Addressed This Visit       Cardiovascular and Mediastinum   Hypertension associated with diabetes (HCC)     Endocrine   Diabetes (HCC) - Primary   Relevant Orders   Bayer DCA Hb A1c Waived     Other   Iron deficiency anemia   Relevant Medications   ferrous gluconate (FERGON) 324 MG tablet    A1c is 6.9 which is slightly elevated but still bound where he runs.  No changes blood pressure and everything else looks good Follow up plan: Return in about 3 months (around 09/17/2023), or if symptoms worsen or fail to improve, for Diabetes recheck.  Counseling provided for all of the vaccine components Orders Placed This Encounter  Procedures   Bayer DCA Hb A1c Waived    Arville Care, MD [facility] 06/17/2023, 1:12 PM

## 2023-06-17 NOTE — Patient Instructions (Signed)
 Please schedule your diabetic eye exam. You may schedule with our office or with your regular eye physician.

## 2023-07-26 DIAGNOSIS — Z961 Presence of intraocular lens: Secondary | ICD-10-CM | POA: Diagnosis not present

## 2023-07-26 DIAGNOSIS — H1013 Acute atopic conjunctivitis, bilateral: Secondary | ICD-10-CM | POA: Diagnosis not present

## 2023-07-26 DIAGNOSIS — H40013 Open angle with borderline findings, low risk, bilateral: Secondary | ICD-10-CM | POA: Diagnosis not present

## 2023-07-26 DIAGNOSIS — E119 Type 2 diabetes mellitus without complications: Secondary | ICD-10-CM | POA: Diagnosis not present

## 2023-07-26 LAB — HM DIABETES EYE EXAM

## 2023-09-21 ENCOUNTER — Ambulatory Visit: Admitting: Family Medicine

## 2023-10-31 ENCOUNTER — Ambulatory Visit: Admitting: Family Medicine

## 2023-11-07 ENCOUNTER — Ambulatory Visit (INDEPENDENT_AMBULATORY_CARE_PROVIDER_SITE_OTHER): Admitting: Family Medicine

## 2023-11-07 ENCOUNTER — Encounter: Payer: Self-pay | Admitting: Family Medicine

## 2023-11-07 VITALS — BP 137/71 | HR 65 | Ht 63.0 in | Wt 136.0 lb

## 2023-11-07 DIAGNOSIS — E1169 Type 2 diabetes mellitus with other specified complication: Secondary | ICD-10-CM | POA: Diagnosis not present

## 2023-11-07 DIAGNOSIS — I152 Hypertension secondary to endocrine disorders: Secondary | ICD-10-CM

## 2023-11-07 DIAGNOSIS — D508 Other iron deficiency anemias: Secondary | ICD-10-CM | POA: Diagnosis not present

## 2023-11-07 DIAGNOSIS — Z7984 Long term (current) use of oral hypoglycemic drugs: Secondary | ICD-10-CM | POA: Diagnosis not present

## 2023-11-07 DIAGNOSIS — E1159 Type 2 diabetes mellitus with other circulatory complications: Secondary | ICD-10-CM

## 2023-11-07 LAB — CBC WITH DIFFERENTIAL/PLATELET
Basophils Absolute: 0 x10E3/uL (ref 0.0–0.2)
Basos: 1 %
EOS (ABSOLUTE): 0.1 x10E3/uL (ref 0.0–0.4)
Eos: 2 %
Hematocrit: 33.6 % — ABNORMAL LOW (ref 37.5–51.0)
Hemoglobin: 10.6 g/dL — ABNORMAL LOW (ref 13.0–17.7)
Immature Grans (Abs): 0 x10E3/uL (ref 0.0–0.1)
Immature Granulocytes: 0 %
Lymphocytes Absolute: 0.9 x10E3/uL (ref 0.7–3.1)
Lymphs: 21 %
MCH: 29.4 pg (ref 26.6–33.0)
MCHC: 31.5 g/dL (ref 31.5–35.7)
MCV: 93 fL (ref 79–97)
Monocytes Absolute: 0.3 x10E3/uL (ref 0.1–0.9)
Monocytes: 7 %
Neutrophils Absolute: 3 x10E3/uL (ref 1.4–7.0)
Neutrophils: 68 %
Platelets: 171 x10E3/uL (ref 150–450)
RBC: 3.6 x10E6/uL — ABNORMAL LOW (ref 4.14–5.80)
RDW: 13.2 % (ref 11.6–15.4)
WBC: 4.4 x10E3/uL (ref 3.4–10.8)

## 2023-11-07 LAB — CMP14+EGFR
ALT: 15 IU/L (ref 0–44)
AST: 23 IU/L (ref 0–40)
Albumin: 4 g/dL (ref 3.6–4.6)
Alkaline Phosphatase: 92 IU/L (ref 44–121)
BUN/Creatinine Ratio: 18 (ref 10–24)
BUN: 23 mg/dL (ref 10–36)
Bilirubin Total: 0.2 mg/dL (ref 0.0–1.2)
CO2: 22 mmol/L (ref 20–29)
Calcium: 9.4 mg/dL (ref 8.6–10.2)
Chloride: 103 mmol/L (ref 96–106)
Creatinine, Ser: 1.27 mg/dL (ref 0.76–1.27)
Globulin, Total: 2.2 g/dL (ref 1.5–4.5)
Glucose: 183 mg/dL — ABNORMAL HIGH (ref 70–99)
Potassium: 5 mmol/L (ref 3.5–5.2)
Sodium: 138 mmol/L (ref 134–144)
Total Protein: 6.2 g/dL (ref 6.0–8.5)
eGFR: 54 mL/min/1.73 — ABNORMAL LOW (ref >59)

## 2023-11-07 LAB — LIPID PANEL
Chol/HDL Ratio: 1.6 ratio (ref 0.0–5.0)
Cholesterol, Total: 132 mg/dL (ref 100–199)
HDL: 81 mg/dL (ref >39)
LDL Chol Calc (NIH): 34 mg/dL (ref 0–99)
Triglycerides: 91 mg/dL (ref 0–149)
VLDL Cholesterol Cal: 17 mg/dL (ref 5–40)

## 2023-11-07 LAB — BAYER DCA HB A1C WAIVED: HB A1C (BAYER DCA - WAIVED): 7.2 % — ABNORMAL HIGH (ref 4.8–5.6)

## 2023-11-07 MED ORDER — ACCU-CHEK GUIDE W/DEVICE KIT: PACK | 0 refills | Status: AC

## 2023-11-07 MED ORDER — JANUMET 50-1000 MG PO TABS: 1.0000 | ORAL_TABLET | Freq: Two times a day (BID) | ORAL | 3 refills | Status: AC

## 2023-11-07 MED ORDER — ROSUVASTATIN CALCIUM 5 MG PO TABS: 5.0000 mg | ORAL_TABLET | Freq: Every day | ORAL | 3 refills | Status: AC

## 2023-11-07 MED ORDER — LISINOPRIL 5 MG PO TABS: 5.0000 mg | ORAL_TABLET | Freq: Every day | ORAL | 3 refills | Status: AC

## 2023-11-07 MED ORDER — BD ASSURE BPM/AUTO WRIST CUFF MISC: 1.0000 | 0 refills | Status: AC | PRN

## 2023-11-07 NOTE — Progress Notes (Signed)
 BP 137/71   Pulse 65   Ht 5' 3 (1.6 m)   Wt 136 lb (61.7 kg)   SpO2 96%   BMI 24.09 kg/m    Subjective:   Patient ID: Roberto Miller, male    DOB: 02-Jan-1932, 89 y.o.   MRN: [id number]  HPI: Roberto Miller is a 89 y.o. male presenting on 11/07/2023 for Medical Management of Chronic Issues and Diabetes   Discussed the use of AI scribe software for clinical note transcription with the patient, who gave verbal consent to proceed.  History of Present Illness   Roberto Miller is a 89 year old male with diabetes, hypertension, and iron deficiency anemia who presents for a recheck of these conditions.  Blood sugar levels have been stable, although he ate more than usual last week. He is currently taking Janumet  and glipizide  for diabetes management and has not experienced any issues with these medications.  He does not regularly check his blood pressure at home due to difficulty using the machine, which has a manual cuff. He is currently taking lisinopril  5 mg for hypertension and reports no side effects.  There is no specific discussion about symptoms or treatment for anemia in this visit.  He is taking rosuvastatin  5 mg for cholesterol management and reports adherence to this medication without any adverse effects.  He uses hydrogen peroxide for nail care.      Relevant past medical, surgical, family and social history reviewed and updated as indicated. Interim medical history since our last visit reviewed. Allergies and medications reviewed and updated.  Review of Systems  Constitutional:  Negative for chills and fever.  Eyes:  Negative for visual disturbance.  Respiratory:  Negative for shortness of breath and wheezing.   Cardiovascular:  Negative for chest pain and leg swelling.  Musculoskeletal:  Negative for back pain and gait problem.  Skin:  Negative for rash.  Neurological:  Negative for dizziness and light-headedness.  All other systems reviewed and are  negative.   Per HPI unless specifically indicated above   Allergies as of 11/07/2023   No Known Allergies      Medication List        Accurate as of November 07, 2023  9:17 AM. If you have any questions, ask your nurse or doctor.          Accu-Chek Guide w/Device Kit Check blood sugar 1x per day and prn   Dx E11.9   Accu-Chek Softclix Lancets lancets Test BS 3 TIMES DAILY Dx E11.9   B-D ASSURE BPM/AUTO WRIST CUFF Misc 1 Device by Does not apply route as needed. Started by: Roberto Miller Sereniti Wan   diclofenac  Sodium 1 % Gel Commonly known as: Voltaren  Apply 2 g topically 4 (four) times daily.   ferrous gluconate  324 MG tablet Commonly known as: FERGON TAKE ONE (1) TABLET BY MOUTH EVERY DAY   glipiZIDE  10 MG tablet Commonly known as: GLUCOTROL  Take 1 tablet (10 mg total) by mouth daily before breakfast.   hydrocortisone  1 % ointment Apply 1 application topically 2 (two) times daily.   Janumet  50-1000 MG tablet Generic drug: sitaGLIPtin -metformin  Take 1 tablet by mouth 2 (two) times daily.   latanoprost  0.005 % ophthalmic solution Commonly known as: XALATAN  Place 1 drop into both eyes at bedtime.   lisinopril  5 MG tablet Commonly known as: ZESTRIL  Take 1 tablet (5 mg total) by mouth daily.   multivitamin capsule Take 1 capsule by mouth daily.   OneTouch Verio test strip Generic  drug: glucose blood Test BS 3 TIMES DAILY Dx E11.9   rosuvastatin  5 MG tablet Commonly known as: CRESTOR  Take 1 tablet (5 mg total) by mouth daily.         Objective:   BP 137/71   Pulse 65   Ht 5' 3 (1.6 m)   Wt 136 lb (61.7 kg)   SpO2 96%   BMI 24.09 kg/m   Wt Readings from Last 3 Encounters:  11/07/23 136 lb (61.7 kg)  06/17/23 134 lb (60.8 kg)  03/03/23 133 lb (60.3 kg)    Physical Exam Vitals and nursing note reviewed.  Constitutional:      General: He is not in acute distress.    Appearance: He is well-developed. He is not diaphoretic.  Eyes:      General: No scleral icterus.    Conjunctiva/sclera: Conjunctivae normal.  Neck:     Thyroid : No thyromegaly.  Cardiovascular:     Rate and Rhythm: Normal rate and regular rhythm.     Heart sounds: Normal heart sounds. No murmur heard. Pulmonary:     Effort: Pulmonary effort is normal. No respiratory distress.     Breath sounds: Normal breath sounds. No wheezing.  Musculoskeletal:        General: No swelling. Normal range of motion.     Cervical back: Neck supple.  Lymphadenopathy:     Cervical: No cervical adenopathy.  Skin:    General: Skin is warm and dry.     Findings: No rash.  Neurological:     Mental Status: He is alert and oriented to person, place, and time.     Coordination: Coordination normal.  Psychiatric:        Behavior: Behavior normal.    Physical Exam   VITALS: BP- 137/71 NECK: Thyroid  normal CHEST: Lungs clear to auscultation       Results for orders placed or performed in visit on 07/26/23  HM DIABETES EYE EXAM   Collection Time: 07/26/23  3:17 PM  Result Value Ref Range   HM Diabetic Eye Exam No Retinopathy No Retinopathy   Diabetic Foot Exam - Simple   Simple Foot Form Diabetic Foot exam was performed with the following findings: Yes 11/07/2023  9:15 AM  Visual Inspection See comments: Yes Sensation Testing Intact to touch and monofilament testing bilaterally: Yes Pulse Check Posterior Tibialis and Dorsalis pulse intact bilaterally: Yes Comments Small healing scabs on the end of his 2nd and 3rd toe on the right foot where he is cut the nail a little bit short, no signs of erythema or infection      Assessment & Plan:   Problem List Items Addressed This Visit       Cardiovascular and Mediastinum   Hypertension associated with diabetes (HCC)   Relevant Medications   JANUMET  50-1000 MG tablet   lisinopril  (ZESTRIL ) 5 MG tablet   rosuvastatin  (CRESTOR ) 5 MG tablet   Other Relevant Orders   CMP14+EGFR   Lipid panel     Endocrine    Diabetes (HCC)   Relevant Medications   JANUMET  50-1000 MG tablet   lisinopril  (ZESTRIL ) 5 MG tablet   rosuvastatin  (CRESTOR ) 5 MG tablet   Other Relevant Orders   Bayer DCA Hb A1c Waived   CBC with Differential/Platelet   CMP14+EGFR   Lipid panel     Other   Iron deficiency anemia - Primary        Type 2 diabetes mellitus Type 2 diabetes mellitus managed with Janumet  and glipizide .  Emphasized foot care to prevent infections and complications. - Continue Janumet  and glipizide . - Educate on proper foot care and nail trimming. - Advise use of Neosporin or triple antibiotic cream for cuts. - Instruct to return immediately if signs of infection develop. - Refer to Dr. Roddie for professional nail care if needed.  Hypertension Hypertension managed with lisinopril  5 mg. Blood pressure 137/71 mmHg, within acceptable range. Discussed target of less than 140/90 mmHg. - Continue lisinopril  5 mg. - Advise weekly home blood pressure checks. - Offer to teach home blood pressure monitoring next visit.  Iron deficiency anemia Iron deficiency anemia.  Hyperlipidemia Hyperlipidemia managed with rosuvastatin  5 mg. - Continue rosuvastatin  5 mg.     Follow up plan: Return in about 3 months (around 02/07/2024), or if symptoms worsen or fail to improve, for Diabetes recheck.  Counseling provided for all of the vaccine components Orders Placed This Encounter  Procedures   Bayer DCA Hb A1c Waived   CBC with Differential/Platelet   CMP14+EGFR   Lipid panel    Roberto Levins, MD [facility] 11/07/2023, 9:17 AM

## 2023-11-16 ENCOUNTER — Ambulatory Visit: Payer: Self-pay | Admitting: Family Medicine

## 2023-12-29 ENCOUNTER — Ambulatory Visit

## 2024-01-05 ENCOUNTER — Ambulatory Visit

## 2024-01-05 DIAGNOSIS — Z23 Encounter for immunization: Secondary | ICD-10-CM | POA: Diagnosis not present

## 2024-01-12 ENCOUNTER — Ambulatory Visit

## 2024-01-17 ENCOUNTER — Ambulatory Visit: Payer: Medicare HMO

## 2024-01-17 VITALS — BP 137/71 | HR 65 | Ht 63.0 in | Wt 136.0 lb

## 2024-01-17 DIAGNOSIS — Z0000 Encounter for general adult medical examination without abnormal findings: Secondary | ICD-10-CM

## 2024-01-17 NOTE — Progress Notes (Signed)
 Subjective:   Roberto Miller is a 89 y.o. who presents for a Medicare Wellness preventive visit.  As a reminder, Annual Wellness Visits don't include a physical exam, and some assessments may be limited, especially if this visit is performed virtually. We may recommend an in-person follow-up visit with your provider if needed.  Visit Complete: Virtual I connected with  [name] on 01/17/24 by a audio enabled telemedicine application and verified that I am speaking with the correct person using two identifiers.  Patient Location: Home  Provider Location: Home Office  I discussed the limitations of evaluation and management by telemedicine. The patient expressed understanding and agreed to proceed.  Vital Signs: Because this visit was a virtual/telehealth visit, some criteria may be missing or patient reported. Any vitals not documented were not able to be obtained and vitals that have been documented are patient reported.  VideoDeclined- This patient declined Librarian, academic. Therefore the visit was completed with audio only.  Persons Participating in Visit: Patient assisted by Spanish Interpreter, Camellia via interpreter services.  AWV Questionnaire: No: Patient Medicare AWV questionnaire was not completed prior to this visit.  Cardiac Risk Factors include: advanced age (>28men, >47 women);diabetes mellitus;hypertension;male gender     Objective:    Today's Vitals   01/17/24 1139  BP: 137/71  Pulse: 65  Weight: 136 lb (61.7 kg)  Height: 5' 3 (1.6 m)   Body mass index is 24.09 kg/m.     01/17/2024   12:54 PM 01/13/2023   12:01 PM 09/10/2021   10:50 AM 09/09/2020    2:37 PM 06/10/2020    8:44 PM 09/06/2019    2:11 PM 08/29/2018    2:43 PM  Advanced Directives  Does Patient Have a Medical Advance Directive? No Yes No No No No Yes  Type of Furniture Conservator/restorer;Living will     Living will;Healthcare Power of  Attorney  Does patient want to make changes to medical advance directive?       No - Patient declined   Copy of Healthcare Power of Attorney in Chart?  No - copy requested     No - copy requested   Would patient like information on creating a medical advance directive?   Yes (MAU/Ambulatory/Procedural Areas - Information given) No - Patient declined  No - Patient declined      Data saved with a previous flowsheet row definition    Current Medications (verified) Outpatient Encounter Medications as of 01/17/2024  Medication Sig   Accu-Chek Softclix Lancets lancets Test BS 3 TIMES DAILY Dx E11.9   Blood Glucose Monitoring Suppl (ACCU-CHEK GUIDE) w/Device KIT Check blood sugar 1x per day and prn   Dx E11.9   Blood Pressure Monitoring (B-D ASSURE BPM/AUTO WRIST CUFF) MISC 1 Device by Does not apply route as needed.   diclofenac  Sodium (VOLTAREN ) 1 % GEL Apply 2 g topically 4 (four) times daily.   ferrous gluconate  (FERGON) 324 MG tablet TAKE ONE (1) TABLET BY MOUTH EVERY DAY   glipiZIDE  (GLUCOTROL ) 10 MG tablet Take 1 tablet (10 mg total) by mouth daily before breakfast.   glucose blood (ONETOUCH VERIO) test strip Test BS 3 TIMES DAILY Dx E11.9   hydrocortisone  1 % ointment Apply 1 application topically 2 (two) times daily.   JANUMET  50-1000 MG tablet Take 1 tablet by mouth 2 (two) times daily.   latanoprost  (XALATAN ) 0.005 % ophthalmic solution Place 1 drop into both eyes at bedtime.  lisinopril  (ZESTRIL ) 5 MG tablet Take 1 tablet (5 mg total) by mouth daily.   Multiple Vitamin (MULTIVITAMIN) capsule Take 1 capsule by mouth daily.   rosuvastatin  (CRESTOR ) 5 MG tablet Take 1 tablet (5 mg total) by mouth daily.   No facility-administered encounter medications on file as of 01/17/2024.    Allergies (verified) Patient has no known allergies.   History: Past Medical History:  Diagnosis Date   Diabetes mellitus without complication (HCC)    Hyperlipidemia    Hypertension    Past Surgical  History:  Procedure Laterality Date   EYE SURGERY Right    Family History  Problem Relation Age of Onset   Diabetes Sister    Diabetes Brother    Diabetes Sister    Social History   Socioeconomic History   Marital status: Married    Spouse name: [name]   Number of children: 9   Years of education: 1   Highest education level: 1st grade  Occupational History   Occupation: retired  Tobacco Use   Smoking status: Never   Smokeless tobacco: Never  Vaping Use   Vaping status: Never Used  Substance and Sexual Activity   Alcohol use: No   Drug use: No   Sexual activity: Not Currently  Other Topics Concern   Not on file  Social History Narrative   Living with daughter in [place] - speaks no English   Social Drivers of Corporate Investment Banker Strain: Low Risk  (01/17/2024)   Overall Financial Resource Strain (CARDIA)    Difficulty of Paying Living Expenses: Not hard at all  Food Insecurity: No Food Insecurity (01/17/2024)   Hunger Vital Sign    Worried About Running Out of Food in the Last Year: Never true    Ran Out of Food in the Last Year: Never true  Transportation Needs: No Transportation Needs (01/17/2024)   PRAPARE - Administrator, Civil Service (Medical): No    Lack of Transportation (Non-Medical): No  Physical Activity: Sufficiently Active (01/17/2024)   Exercise Vital Sign    Days of Exercise per Week: 7 days    Minutes of Exercise per Session: 30 min  Stress: No Stress Concern Present (01/17/2024)   [employer] of Occupational Health - Occupational Stress Questionnaire    Feeling of Stress: Not at all  Social Connections: Moderately Integrated (01/17/2024)   Social Connection and Isolation Panel    Frequency of Communication with Friends and Family: More than three times a week    Frequency of Social Gatherings with Friends and Family: More than three times a week    Attends Religious Services: More than 4 times per year    Active Member  of [employer] or Organizations: Yes    Attends Banker Meetings: More than 4 times per year    Marital Status: Widowed    Tobacco Counseling Counseling given: Yes    Clinical Intake:  Pre-visit preparation completed: Yes  Pain : No/denies pain     BMI - recorded: 24.09 Nutritional Status: BMI of 19-24  Normal Nutritional Risks: None Diabetes: Yes  Lab Results  Component Value Date   HGBA1C 7.2 (H) 11/07/2023   HGBA1C 6.9 (H) 06/17/2023   HGBA1C 6.7 (H) 03/03/2023     How often do you need to have someone help you when you read instructions, pamphlets, or other written materials from your doctor or pharmacy?: 1 - Never  Interpreter Needed?: No  Information entered by :: alia t/cma  Activities of Daily Living     01/17/2024   12:53 PM  In your present state of health, do you have any difficulty performing the following activities:  Hearing? 0  Vision? 0  Difficulty concentrating or making decisions? 0  Walking or climbing stairs? 0  Dressing or bathing? 0  Doing errands, shopping? 0  Preparing Food and eating ? N  Using the Toilet? N  In the past six months, have you accidently leaked urine? N  Do you have problems with loss of bowel control? N  Managing your Medications? N  Managing your Finances? N  Housekeeping or managing your Housekeeping? N    Patient Care Team: [name], Fonda LABOR, MD as PCP - General (Family Medicine) Billee Mliss BIRCH, [facility] as Pharmacist (Family Medicine) Octavia, Charlie Hamilton, MD as Consulting Physician (Ophthalmology)  I have updated your Care Teams any recent Medical Services you may have received from other providers in the past year.     Assessment:   This is a routine wellness examination for Zanden.  Hearing/Vision screen Hearing Screening - Comments:: Pt denies hearing dif Vision Screening - Comments:: Pt wear glasses/pt goes to [facility] in [place]/last ov 2025   Goals Addressed                This Visit's Progress     Stay Active (pt-stated)   On track      Depression Screen     01/17/2024   12:55 PM 11/07/2023    8:55 AM 06/17/2023   12:56 PM 03/03/2023    3:22 PM 01/13/2023   11:52 AM 12/01/2022    3:57 PM 09/01/2022   10:07 AM  PHQ 2/9 Scores  PHQ - 2 Score 0 0 0 0 0 0 0  PHQ- 9 Score    0 0 0     Fall Risk     01/17/2024   12:52 PM 11/07/2023    8:55 AM 06/17/2023   12:56 PM 01/13/2023   12:00 PM 12/01/2022    3:57 PM  Fall Risk   Falls in the past year? 0 0 0 0 0  Number falls in past yr: 0 0  0   Injury with Fall? 0 0  0   Risk for fall due to : No Fall Risks No Fall Risks  No Fall Risks   Follow up Falls evaluation completed Falls evaluation completed  Falls prevention discussed     MEDICARE RISK AT HOME:  Medicare Risk at Home Any stairs in or around the home?: No If so, are there any without handrails?: No Home free of loose throw rugs in walkways, pet beds, electrical cords, etc?: Yes Adequate lighting in your home to reduce risk of falls?: Yes Life alert?: No Use of a cane, walker or w/c?: No Grab bars in the bathroom?: Yes Shower chair or bench in shower?: Yes Elevated toilet seat or a handicapped toilet?: Yes  TIMED UP AND GO:  Was the test performed?  no  Cognitive Function: 6CIT completed    05/11/2017   12:00 PM  MMSE - Mini Mental State Exam  Not completed: Unable to complete        01/17/2024    1:23 PM 01/17/2024   12:56 PM 01/13/2023   12:01 PM 09/10/2021   10:53 AM 09/06/2019    2:15 PM  6CIT Screen  What Year? 0 points 0 points 0 points 0 points 4 points  What month? 0 points 0 points 0 points 0  points 0 points  What time? 0 points 0 points 0 points 0 points 0 points  Count back from 20 0 points 0 points 0 points 2 points 0 points  Months in reverse 2 points 0 points 0 points 4 points 4 points  Repeat phrase 8 points 0 points 4 points 4 points 10 points  Total Score 10 points 0 points 4 points 10 points 18 points     Immunizations Immunization History  Administered Date(s) Administered   Fluad Quad(high Dose 65+) 01/10/2019, 02/20/2020, 12/17/2020, 02/18/2022   Fluad Trivalent(High Dose 65+) 12/01/2022   INFLUENZA, HIGH DOSE SEASONAL PF 03/06/2018, 01/05/2024   Influenza,inj,Quad PF,6+ Mos 12/15/2012, 12/27/2013, 01/22/2016   Moderna Sars-Cov-2 Peds vaccine 30yrs thru 74yrs 11/12/2020   Moderna Sars-Covid-2 Vaccination 01/23/2020   Pneumococcal Conjugate-13 07/04/2014   Pneumococcal Polysaccharide-23 11/19/2010   Tdap 09/19/2010, 12/17/2020   Zoster Recombinant(Shingrix) 06/02/2020, 09/15/2020    Screening Tests Health Maintenance  Topic Date Due   COVID-19 Vaccine (3 - 2025-26 season) 11/21/2023   HEMOGLOBIN A1C  05/09/2024   OPHTHALMOLOGY EXAM  07/25/2024   FOOT EXAM  11/06/2024   Medicare Annual Wellness (AWV)  01/16/2025   DTaP/Tdap/Td (3 - Td or Tdap) 12/18/2030   Pneumococcal Vaccine: 50+ Years  Completed   Influenza Vaccine  Completed   Zoster Vaccines- Shingrix  Completed   Meningococcal B Vaccine  Aged Out    Health Maintenance Items Addressed: See Nurse Notes at the end of this note  Additional Screening:  Vision Screening: Recommended annual ophthalmology exams for early detection of glaucoma and other disorders of the eye. Is the patient up to date with their annual eye exam?  Yes  Who is the provider or what is the name of the office in which the patient attends annual eye exams? [facility] in [facility]  Dental Screening: Recommended annual dental exams for proper oral hygiene  Community Resource Referral / Chronic Care Management: CRR required this visit?  No   CCM required this visit?  No   Plan:    I have personally reviewed and noted the following in the patient's chart:   Medical and social history Use of alcohol, tobacco or illicit drugs  Current medications and supplements including opioid prescriptions. Patient is not currently taking opioid  prescriptions. Functional ability and status Nutritional status Physical activity Advanced directives List of other physicians Hospitalizations, surgeries, and ER visits in previous 12 months Vitals Screenings to include cognitive, depression, and falls Referrals and appointments  In addition, I have reviewed and discussed with patient certain preventive protocols, quality metrics, and best practice recommendations. A written personalized care plan for preventive services as well as general preventive health recommendations were provided to patient.   Ozie Ned, CMA   01/17/2024   After Visit Summary: (MyChart) Due to this being a telephonic visit, the after visit summary with patients personalized plan was offered to patient via MyChart   Notes: Nothing significant to report at this time.

## 2024-01-17 NOTE — Patient Instructions (Signed)
 [name],  Thank you for taking the time for your Medicare Wellness Visit. I appreciate your continued commitment to your health goals. Please review the care plan we discussed, and feel free to reach out if I can assist you further.  Medicare recommends these wellness visits once per year to help you and your care team stay ahead of potential health issues. These visits are designed to focus on prevention, allowing your provider to concentrate on managing your acute and chronic conditions during your regular appointments.  Please note that Annual Wellness Visits do not include a physical exam. Some assessments may be limited, especially if the visit was conducted virtually. If needed, we may recommend a separate in-person follow-up with your provider.  Ongoing Care Seeing your primary care provider every 3 to 6 months helps us  monitor your health and provide consistent, personalized care.   Referrals If a referral was made during today's visit and you haven't received any updates within two weeks, please contact the referred provider directly to check on the status.  Recommended Screenings:  Health Maintenance  Topic Date Due   COVID-19 Vaccine (3 - 2025-26 season) 11/21/2023   Medicare Annual Wellness Visit  01/13/2024   Hemoglobin A1C  05/09/2024   Eye exam for diabetics  07/25/2024   Complete foot exam   11/06/2024   DTaP/Tdap/Td vaccine (3 - Td or Tdap) 12/18/2030   Pneumococcal Vaccine for age over 76  Completed   Flu Shot  Completed   Zoster (Shingles) Vaccine  Completed   Meningitis B Vaccine  Aged Out       01/17/2024   12:54 PM  Advanced Directives  Does Patient Have a Medical Advance Directive? No   Advance Care Planning is important because it: Ensures you receive medical care that aligns with your values, goals, and preferences. Provides guidance to your family and loved ones, reducing the emotional burden of decision-making during critical moments.  Vision:  Annual vision screenings are recommended for early detection of glaucoma, cataracts, and diabetic retinopathy. These exams can also reveal signs of chronic conditions such as diabetes and high blood pressure.  Dental: Annual dental screenings help detect early signs of oral cancer, gum disease, and other conditions linked to overall health, including heart disease and diabetes.  Please see the attached documents for additional preventive care recommendations.

## 2024-02-20 ENCOUNTER — Ambulatory Visit: Payer: Self-pay | Admitting: Family Medicine

## 2024-02-20 ENCOUNTER — Encounter: Payer: Self-pay | Admitting: Family Medicine

## 2024-02-20 VITALS — BP 140/63 | HR 62 | Ht 63.0 in | Wt 134.0 lb

## 2024-02-20 DIAGNOSIS — E1159 Type 2 diabetes mellitus with other circulatory complications: Secondary | ICD-10-CM

## 2024-02-20 DIAGNOSIS — I152 Hypertension secondary to endocrine disorders: Secondary | ICD-10-CM | POA: Diagnosis not present

## 2024-02-20 DIAGNOSIS — E1169 Type 2 diabetes mellitus with other specified complication: Secondary | ICD-10-CM | POA: Diagnosis not present

## 2024-02-20 DIAGNOSIS — D508 Other iron deficiency anemias: Secondary | ICD-10-CM | POA: Diagnosis not present

## 2024-02-20 DIAGNOSIS — Z7984 Long term (current) use of oral hypoglycemic drugs: Secondary | ICD-10-CM

## 2024-02-20 LAB — BAYER DCA HB A1C WAIVED: HB A1C (BAYER DCA - WAIVED): 7.3 % — ABNORMAL HIGH (ref 4.8–5.6)

## 2024-02-20 MED ORDER — ONETOUCH VERIO VI STRP: ORAL_STRIP | 3 refills | Status: AC

## 2024-02-20 MED ORDER — GLIPIZIDE 10 MG PO TABS: 10.0000 mg | ORAL_TABLET | Freq: Every day | ORAL | 3 refills | Status: AC

## 2024-02-20 NOTE — Progress Notes (Signed)
 BP (!) 140/63   Pulse 62   Ht 5' 3 (1.6 m)   Wt 134 lb (60.8 kg)   SpO2 95%   BMI 23.74 kg/m    Subjective:   Patient ID: Roberto Miller, male    DOB: 1931-11-03, 89 y.o.   MRN: [id number]  HPI: Roberto Miller is a 89 y.o. male presenting on 02/20/2024 for Medical Management of Chronic Issues, Diabetes, Hyperlipidemia, and Hypertension   Discussed the use of AI scribe software for clinical note transcription with the patient, who gave verbal consent to proceed.  History of Present Illness   Roberto Miller is a 89 year old male with diabetes who presents for routine follow-up.  Glycemic control - Fluctuating blood glucose levels, with readings as high as 170 mg/dL and as low as 879-869 mg/dL - Last hemoglobin J8r was 7.3%, increased from previous value of 7.2% - Currently taking Janumet  and glipizide  for diabetes management  Dietary habits - Eats breakfast and lunch regularly - Frequently skips dinner, consuming only a small coffee in the evening - Monitors carbohydrate intake, avoiding excessive bread, rice, corn, and tortillas - Selects packaged foods with lower carbohydrate content  Physical activity - Engages in regular walking - Plays with grandchildren as part of daily activity  Chronic medication use - Takes rosuvastatin  for hyperlipidemia - Takes lisinopril  for hypertension - Takes ferrous sulfate  for iron supplementation          Relevant past medical, surgical, family and social history reviewed and updated as indicated. Interim medical history since our last visit reviewed. Allergies and medications reviewed and updated.  Review of Systems  Constitutional:  Negative for chills and fever.  Eyes:  Negative for visual disturbance.  Respiratory:  Negative for shortness of breath and wheezing.   Cardiovascular:  Negative for chest pain and leg swelling.  Musculoskeletal:  Negative for back pain and gait problem.  Skin:  Negative for rash.   Neurological:  Negative for dizziness and light-headedness.  All other systems reviewed and are negative.   Per HPI unless specifically indicated above   Allergies as of 02/20/2024   No Known Allergies      Medication List        Accurate as of February 20, 2024 11:10 AM. If you have any questions, ask your nurse or doctor.          Accu-Chek Guide w/Device Kit Check blood sugar 1x per day and prn   Dx E11.9   Accu-Chek Softclix Lancets lancets Test BS 3 TIMES DAILY Dx E11.9   B-D ASSURE BPM/AUTO WRIST CUFF Misc 1 Device by Does not apply route as needed.   diclofenac  Sodium 1 % Gel Commonly known as: Voltaren  Apply 2 g topically 4 (four) times daily.   ferrous gluconate  324 MG tablet Commonly known as: FERGON TAKE ONE (1) TABLET BY MOUTH EVERY DAY   glipiZIDE  10 MG tablet Commonly known as: GLUCOTROL  Take 1 tablet (10 mg total) by mouth daily before breakfast.   hydrocortisone  1 % ointment Apply 1 application topically 2 (two) times daily.   Janumet  50-1000 MG tablet Generic drug: sitaGLIPtin -metformin  Take 1 tablet by mouth 2 (two) times daily.   latanoprost  0.005 % ophthalmic solution Commonly known as: XALATAN  Place 1 drop into both eyes at bedtime.   lisinopril  5 MG tablet Commonly known as: ZESTRIL  Take 1 tablet (5 mg total) by mouth daily.   multivitamin capsule Take 1 capsule by mouth daily.   OneTouch Verio test strip Generic  drug: glucose blood Test BS 3 TIMES DAILY Dx E11.9   rosuvastatin  5 MG tablet Commonly known as: CRESTOR  Take 1 tablet (5 mg total) by mouth daily.         Objective:   BP (!) 140/63   Pulse 62   Ht 5' 3 (1.6 m)   Wt 134 lb (60.8 kg)   SpO2 95%   BMI 23.74 kg/m   Wt Readings from Last 3 Encounters:  02/20/24 134 lb (60.8 kg)  01/17/24 136 lb (61.7 kg)  11/07/23 136 lb (61.7 kg)    Physical Exam Vitals and nursing note reviewed.  Constitutional:      General: He is not in acute distress.     Appearance: He is well-developed. He is not diaphoretic.  Eyes:     General: No scleral icterus.    Conjunctiva/sclera: Conjunctivae normal.  Neck:     Thyroid : No thyromegaly.  Cardiovascular:     Rate and Rhythm: Normal rate and regular rhythm.     Heart sounds: Normal heart sounds. No murmur heard. Pulmonary:     Effort: Pulmonary effort is normal. No respiratory distress.     Breath sounds: Normal breath sounds. No wheezing.  Musculoskeletal:        General: No swelling. Normal range of motion.     Cervical back: Neck supple.  Lymphadenopathy:     Cervical: No cervical adenopathy.  Skin:    General: Skin is warm and dry.     Findings: No rash.  Neurological:     Mental Status: He is alert and oriented to person, place, and time.     Coordination: Coordination normal.  Psychiatric:        Behavior: Behavior normal.                 Assessment & Plan:   Problem List Items Addressed This Visit       Cardiovascular and Mediastinum   Hypertension associated with diabetes (HCC)   Relevant Medications   glipiZIDE  (GLUCOTROL ) 10 MG tablet     Endocrine   Diabetes (HCC) - Primary   Relevant Medications   glipiZIDE  (GLUCOTROL ) 10 MG tablet   glucose blood (ONETOUCH VERIO) test strip   Other Relevant Orders   Bayer DCA Hb A1c Waived     Other   Iron deficiency anemia       Type 2 diabetes mellitus A1c increased to 7.3. Blood glucose 130-170 mg/dL. High carbohydrate intake noted. - Continue Janumet  and glipizide . - Advised reducing carbohydrate intake, especially galletas, pan, arroz, tortillas. - Encouraged vegetables, chicken, fish, guajolote. - Recommended photographing galletas ingredients for review. - Encouraged increased physical activity.  Iron deficiency anemia Managed with ferrous sulfate . - Continue ferrous sulfate .  General Health Maintenance Discussed dietary habits and physical activity for diabetes and health management. - Encouraged  balanced meals with reduced carbohydrates. - Promoted regular physical activity.          Follow up plan: Return in about 3 months (around 05/20/2024), or if symptoms worsen or fail to improve, for Diabetes recheck.  Counseling provided for all of the vaccine components Orders Placed This Encounter  Procedures   Bayer DCA Hb A1c Waived    Fonda Levins, MD [facility] 02/20/2024, 11:10 AM

## 2024-05-21 ENCOUNTER — Ambulatory Visit: Admitting: Family Medicine
